# Patient Record
Sex: Male | Born: 1985 | Race: White | Hispanic: No | Marital: Single | Smoking: Current every day smoker
Health system: Southern US, Community
[De-identification: ages and names within clinical notes are randomized; demographics above are authoritative.]

## PROBLEM LIST (undated history)

## (undated) DIAGNOSIS — Z720 Tobacco use: Secondary | ICD-10-CM

## (undated) DIAGNOSIS — R569 Unspecified convulsions: Secondary | ICD-10-CM

## (undated) DIAGNOSIS — F1010 Alcohol abuse, uncomplicated: Secondary | ICD-10-CM

## (undated) DIAGNOSIS — Z8774 Personal history of (corrected) congenital malformations of heart and circulatory system: Secondary | ICD-10-CM

## (undated) HISTORY — PX: PATENT DUCTUS ARTERIOUS REPAIR: SHX269

---

## 2012-04-29 ENCOUNTER — Ambulatory Visit (INDEPENDENT_AMBULATORY_CARE_PROVIDER_SITE_OTHER): Payer: BC Managed Care – PPO | Admitting: Licensed Clinical Social Worker

## 2012-04-29 DIAGNOSIS — F4323 Adjustment disorder with mixed anxiety and depressed mood: Secondary | ICD-10-CM

## 2012-05-12 ENCOUNTER — Ambulatory Visit (INDEPENDENT_AMBULATORY_CARE_PROVIDER_SITE_OTHER): Payer: BC Managed Care – PPO | Admitting: Licensed Clinical Social Worker

## 2012-05-12 DIAGNOSIS — F4323 Adjustment disorder with mixed anxiety and depressed mood: Secondary | ICD-10-CM

## 2012-05-26 ENCOUNTER — Ambulatory Visit: Payer: BC Managed Care – PPO | Admitting: Licensed Clinical Social Worker

## 2012-06-09 ENCOUNTER — Ambulatory Visit (INDEPENDENT_AMBULATORY_CARE_PROVIDER_SITE_OTHER): Payer: BC Managed Care – PPO | Admitting: Licensed Clinical Social Worker

## 2012-06-09 DIAGNOSIS — F4323 Adjustment disorder with mixed anxiety and depressed mood: Secondary | ICD-10-CM

## 2012-06-23 ENCOUNTER — Ambulatory Visit (INDEPENDENT_AMBULATORY_CARE_PROVIDER_SITE_OTHER): Payer: BC Managed Care – PPO | Admitting: Licensed Clinical Social Worker

## 2012-06-23 DIAGNOSIS — F4323 Adjustment disorder with mixed anxiety and depressed mood: Secondary | ICD-10-CM

## 2012-08-24 ENCOUNTER — Ambulatory Visit (INDEPENDENT_AMBULATORY_CARE_PROVIDER_SITE_OTHER): Payer: BC Managed Care – PPO | Admitting: Licensed Clinical Social Worker

## 2012-08-24 DIAGNOSIS — F4323 Adjustment disorder with mixed anxiety and depressed mood: Secondary | ICD-10-CM

## 2012-08-31 ENCOUNTER — Ambulatory Visit (INDEPENDENT_AMBULATORY_CARE_PROVIDER_SITE_OTHER): Payer: BC Managed Care – PPO | Admitting: Licensed Clinical Social Worker

## 2012-08-31 DIAGNOSIS — F4323 Adjustment disorder with mixed anxiety and depressed mood: Secondary | ICD-10-CM

## 2012-09-07 ENCOUNTER — Ambulatory Visit (INDEPENDENT_AMBULATORY_CARE_PROVIDER_SITE_OTHER): Payer: BC Managed Care – PPO | Admitting: Licensed Clinical Social Worker

## 2012-09-07 DIAGNOSIS — F4323 Adjustment disorder with mixed anxiety and depressed mood: Secondary | ICD-10-CM

## 2014-07-08 ENCOUNTER — Emergency Department (HOSPITAL_COMMUNITY)
Admission: EM | Admit: 2014-07-08 | Discharge: 2014-07-09 | Disposition: A | Discharge status: Home / Self Care | Payer: BC Managed Care – PPO | Attending: Emergency Medicine | Admitting: Emergency Medicine

## 2014-07-08 ENCOUNTER — Encounter (HOSPITAL_COMMUNITY): Payer: Self-pay | Admitting: Emergency Medicine

## 2014-07-08 DIAGNOSIS — R1013 Epigastric pain: Secondary | ICD-10-CM | POA: Insufficient documentation

## 2014-07-08 DIAGNOSIS — F17200 Nicotine dependence, unspecified, uncomplicated: Secondary | ICD-10-CM | POA: Insufficient documentation

## 2014-07-08 DIAGNOSIS — R109 Unspecified abdominal pain: Secondary | ICD-10-CM | POA: Insufficient documentation

## 2014-07-08 DIAGNOSIS — R1011 Right upper quadrant pain: Secondary | ICD-10-CM | POA: Diagnosis not present

## 2014-07-08 NOTE — ED Notes (Signed)
Pt presents with intermittent RUQ pain x2 weeks, pain becoming persist x3 days. Pt denies nausea, vomiting, SOB, fever, chills, or chest pain.

## 2014-07-09 ENCOUNTER — Emergency Department (HOSPITAL_COMMUNITY): Payer: BC Managed Care – PPO

## 2014-07-09 LAB — CBC WITH DIFFERENTIAL/PLATELET
Basophils Absolute: 0 10*3/uL (ref 0.0–0.1)
Basophils Relative: 1 % (ref 0–1)
EOS ABS: 0.1 10*3/uL (ref 0.0–0.7)
EOS PCT: 2 % (ref 0–5)
HEMATOCRIT: 43.2 % (ref 39.0–52.0)
HEMOGLOBIN: 15.7 g/dL (ref 13.0–17.0)
LYMPHS ABS: 2.3 10*3/uL (ref 0.7–4.0)
Lymphocytes Relative: 42 % (ref 12–46)
MCH: 34.4 pg — AB (ref 26.0–34.0)
MCHC: 36.3 g/dL — ABNORMAL HIGH (ref 30.0–36.0)
MCV: 94.7 fL (ref 78.0–100.0)
MONO ABS: 0.5 10*3/uL (ref 0.1–1.0)
Monocytes Relative: 9 % (ref 3–12)
Neutro Abs: 2.5 10*3/uL (ref 1.7–7.7)
Neutrophils Relative %: 46 % (ref 43–77)
Platelets: 142 10*3/uL — ABNORMAL LOW (ref 150–400)
RBC: 4.56 MIL/uL (ref 4.22–5.81)
RDW: 12.1 % (ref 11.5–15.5)
WBC: 5.4 10*3/uL (ref 4.0–10.5)

## 2014-07-09 LAB — COMPREHENSIVE METABOLIC PANEL
ALK PHOS: 63 U/L (ref 39–117)
ALT: 62 U/L — ABNORMAL HIGH (ref 0–53)
AST: 67 U/L — ABNORMAL HIGH (ref 0–37)
Albumin: 4.2 g/dL (ref 3.5–5.2)
Anion gap: 16 — ABNORMAL HIGH (ref 5–15)
BUN: 9 mg/dL (ref 6–23)
CALCIUM: 9.3 mg/dL (ref 8.4–10.5)
CO2: 23 mEq/L (ref 19–32)
Chloride: 102 mEq/L (ref 96–112)
Creatinine, Ser: 0.84 mg/dL (ref 0.50–1.35)
GFR calc Af Amer: >90 mL/min (ref >90)
GFR calc non Af Amer: >90 mL/min (ref >90)
GLUCOSE: 85 mg/dL (ref 70–99)
POTASSIUM: 4 meq/L (ref 3.7–5.3)
Sodium: 141 mEq/L (ref 137–147)
Total Bilirubin: 0.3 mg/dL (ref 0.3–1.2)
Total Protein: 8.2 g/dL (ref 6.0–8.3)

## 2014-07-09 LAB — LIPASE, BLOOD: Lipase: 34 U/L (ref 11–59)

## 2014-07-09 MED ORDER — IOHEXOL 300 MG/ML  SOLN
100.0000 mL | Freq: Once | INTRAMUSCULAR | Status: AC | PRN
  Administered 2014-07-09: 100 mL via INTRAVENOUS

## 2014-07-09 MED ORDER — ONDANSETRON 8 MG PO TBDP: 8.0000 mg | ORAL_TABLET | Freq: Three times a day (TID) | ORAL | Status: DC | PRN

## 2014-07-09 MED ORDER — SODIUM CHLORIDE 0.9 % IV BOLUS (SEPSIS)
1000.0000 mL | Freq: Once | INTRAVENOUS | Status: AC
  Administered 2014-07-09: 1000 mL via INTRAVENOUS

## 2014-07-09 MED ORDER — OMEPRAZOLE 20 MG PO CPDR: 20.0000 mg | DELAYED_RELEASE_CAPSULE | Freq: Two times a day (BID) | ORAL | Status: DC

## 2014-07-09 MED ORDER — MORPHINE SULFATE 4 MG/ML IJ SOLN
6.0000 mg | Freq: Once | INTRAMUSCULAR | Status: AC
  Administered 2014-07-09: 6 mg via INTRAVENOUS
  Filled 2014-07-09: qty 2

## 2014-07-09 MED ORDER — IOHEXOL 300 MG/ML  SOLN
25.0000 mL | Freq: Once | INTRAMUSCULAR | Status: AC | PRN
  Administered 2014-07-09: 25 mL via ORAL

## 2014-07-09 NOTE — ED Provider Notes (Signed)
CSN: [id number]     Arrival date & time 07/08/14  2122 History   First MD Initiated Contact with Patient 07/08/14 2345     Chief Complaint  Patient presents with  . Abdominal Pain      HPI Patient presents to the emergency Department intermittent right upper quadrant pain over the past 2 weeks is now developing persistent upper abdominal discomfort and pain.  No nausea or vomiting.  Denies diarrhea.  Denies fevers or chills.  He does report significant alcohol use.  No prior history of pancreatitis.  Denies back pain or flank pain.  No chest pain or shortness of breath.  No fevers or chills   History reviewed. No pertinent past medical history. Past Surgical History  Procedure Laterality Date  . Patent ductus arterious repair      28 years old   History reviewed. No pertinent family history. History  Substance Use Topics  . Smoking status: Current Every Day Smoker  . Smokeless tobacco: Not on file  . Alcohol Use: 3.3 oz/week    3 Drinks containing 0.5 oz of alcohol, 3 Cans of beer per week    Review of Systems  All other systems reviewed and are negative.     Allergies  Review of patient's allergies indicates no known allergies.  Home Medications   Prior to Admission medications   Medication Sig Start Date End Date Taking? Authorizing Provider  omeprazole (PRILOSEC) 20 MG capsule Take 1 capsule (20 mg total) by mouth 2 (two) times daily before a meal. 07/09/14   Lyanne Co, MD  ondansetron (ZOFRAN ODT) 8 MG disintegrating tablet Take 1 tablet (8 mg total) by mouth every 8 (eight) hours as needed for nausea or vomiting. 07/09/14   Lyanne Co, MD   BP 147/80  Pulse 81  Temp(Src) 98.7 F (37.1 C) (Oral)  Resp 19  Wt 230 lb (104.327 kg)  SpO2 97% Physical Exam  Nursing note and vitals reviewed. Constitutional: He is oriented to person, place, and time. He appears well-developed and well-nourished.  HENT:  Head: Normocephalic and atraumatic.  Eyes: EOM are  normal.  Neck: Normal range of motion.  Cardiovascular: Normal rate, regular rhythm, normal heart sounds and intact distal pulses.   Pulmonary/Chest: Effort normal and breath sounds normal. No respiratory distress.  Abdominal: Soft. He exhibits no distension.  Mild epigastric tenderness without guarding or rebound. Mild RUQ abdominal tenderness  Musculoskeletal: Normal range of motion.  Neurological: He is alert and oriented to person, place, and time.  Skin: Skin is warm and dry.  Psychiatric: He has a normal mood and affect. Judgment normal.    ED Course  Procedures (including critical care time) Labs Review Labs Reviewed  CBC WITH DIFFERENTIAL - Abnormal; Notable for the following:    MCH 34.4 (*)    MCHC 36.3 (*)    Platelets 142 (*)    All other components within normal limits  COMPREHENSIVE METABOLIC PANEL - Abnormal; Notable for the following:    AST 67 (*)    ALT 62 (*)    Anion gap 16 (*)    All other components within normal limits  LIPASE, BLOOD    Imaging Review Ct Abdomen Pelvis W Contrast  07/09/2014   CLINICAL DATA:  Intermittent right upper quadrant pain x2 weeks  EXAM: CT ABDOMEN AND PELVIS WITH CONTRAST  TECHNIQUE: Multidetector CT imaging of the abdomen and pelvis was performed using the standard protocol following bolus administration of intravenous contrast.  CONTRAST:  OMNIPAQUE IOHEXOL 300 MG/ML  SOLN  COMPARISON:  None.  FINDINGS: Lower chest:  Lung bases are clear.  Hepatobiliary: Liver is within normal limits.  Gallbladder is underdistended. No intrahepatic or extrahepatic ductal dilatation.  Spleen: Within normal limits.  Pancreas: Within normal limits.  Stomach/Bowel: Stomach is unremarkable.  No evidence of bowel obstruction.  Normal appendix.  Adrenals/urinary tract: Adrenal glands are unremarkable.  Kidneys within normal limits.  No hydronephrosis.  Bladder is within normal limits.  Vascular/Lymphatic: No evidence of abdominal aortic aneurysm.  No  suspicious abdominopelvic lymphadenopathy.  Reproductive: Prostate is unremarkable.  Musculoskeletal: Visualized osseous structures are within normal limits.  Other: No abdominopelvic ascites.  IMPRESSION: No evidence of bowel obstruction.  Normal appendix.  No CT findings to account for the patient's intermittent right upper quadrant abdominal pain.   Electronically Signed   By: Charline Bills M.D.   On: 07/09/2014 01:30   US Abdomen Limited  07/09/2014   CLINICAL DATA:  RUQ abdominal pain  EXAM: US ABDOMEN LIMITED - RIGHT UPPER QUADRANT  COMPARISON:  Prior CT from earlier the same day.  FINDINGS: Gallbladder:  No gallstones or wall thickening visualized. No sonographic Murphy sign noted.  Common bile duct:  Diameter: 3.6 mm  Liver:  No focal lesion identified. Within normal limits in parenchymal echogenicity.  IMPRESSION: Normal right upper quadrant ultrasound.   Electronically Signed   By: Rise Mu M.D.   On: 07/09/2014 03:36  I personally reviewed the imaging tests through PACS system I reviewed available ER/hospitalization records through the EMR    EKG Interpretation None      MDM   Final diagnoses:  Abdominal pain, unspecified abdominal location    Overall the patient is feeling better.  CT scan demonstrates no significant pathology but the gallbladder was somewhat contracted on imaging studies.  Focused right upper quadrant ultrasound was performed which demonstrates no evidence of cholelithiasis.  Patient will be placed on twice a day PPI and asked to followup with GI.    Lyanne Co, MD 07/09/14 [phone]

## 2014-07-09 NOTE — ED Notes (Signed)
Pain almost gone pt sleeping

## 2014-07-09 NOTE — ED Notes (Signed)
To us

## 2014-07-09 NOTE — ED Notes (Signed)
ptfeelikng better

## 2014-07-09 NOTE — ED Notes (Signed)
To c-t pt only took 4 mg of ms he did not like the way it made him feel

## 2018-08-12 DIAGNOSIS — R569 Unspecified convulsions: Secondary | ICD-10-CM

## 2018-08-12 HISTORY — DX: Unspecified convulsions: R56.9

## 2018-08-23 ENCOUNTER — Encounter: Payer: Self-pay | Admitting: Podiatry

## 2018-08-26 ENCOUNTER — Ambulatory Visit: Payer: Self-pay | Admitting: Podiatry

## 2018-08-26 ENCOUNTER — Encounter

## 2018-08-30 ENCOUNTER — Ambulatory Visit (INDEPENDENT_AMBULATORY_CARE_PROVIDER_SITE_OTHER): Payer: Self-pay | Admitting: Podiatry

## 2018-08-30 ENCOUNTER — Other Ambulatory Visit: Payer: Self-pay | Admitting: Podiatry

## 2018-08-30 ENCOUNTER — Ambulatory Visit (INDEPENDENT_AMBULATORY_CARE_PROVIDER_SITE_OTHER): Payer: Self-pay

## 2018-08-30 DIAGNOSIS — G629 Polyneuropathy, unspecified: Secondary | ICD-10-CM

## 2018-08-30 DIAGNOSIS — M778 Other enthesopathies, not elsewhere classified: Secondary | ICD-10-CM

## 2018-08-30 DIAGNOSIS — M779 Enthesopathy, unspecified: Secondary | ICD-10-CM

## 2018-08-30 DIAGNOSIS — R200 Anesthesia of skin: Secondary | ICD-10-CM

## 2018-08-30 NOTE — Progress Notes (Signed)
This encounter was created in error - please disregard.

## 2018-08-30 NOTE — Patient Instructions (Signed)
I will put in a referral for you to see a neurologist. If you do not hear from anyone in the next week, please let me know  If was nice to meet you today. If you have any questions or any further concerns, please feel fee to give me a call. You can call our office at [phone] or please feel fee to send me a message through MyChart.

## 2018-09-01 ENCOUNTER — Telehealth: Payer: Self-pay | Admitting: *Deleted

## 2018-09-01 DIAGNOSIS — G629 Polyneuropathy, unspecified: Secondary | ICD-10-CM

## 2018-09-01 DIAGNOSIS — R200 Anesthesia of skin: Secondary | ICD-10-CM

## 2018-09-01 NOTE — Telephone Encounter (Signed)
Faxed referral, clinicals and demographics to [facility]. 

## 2018-09-01 NOTE — Progress Notes (Signed)
Subjective:   Patient ID: Darren Diaz, male   DOB: 32 y.o.   MRN: [id number]   HPI 32 year old male presents the office today for concerns of numbness to both of his feet and ankles.  He states that he had a little bit of numbness and all of a sudden about a month ago he woke up and he had numbness to both feet into the ankles.  He states that symmetrical on both sides.  He denies any recent injury or trauma.  Denies any back, hip, knee pain.  He states that he feels unbalanced at times because the numbness she has had no falls or weakness that he can recall.  He denies any burning pain.  The pain does not wake him up at night.  Is no other concerns.  He does smoke about a third to half a pack per day for the last 8 years.  He drinks alcohol about 2 beers and 2 shots a day for the last 14 years.  Denies any history of diabetes.  Review of Systems  All other systems reviewed and are negative.   No past medical history on file.  Past Surgical History:  Procedure Laterality Date  . PATENT DUCTUS ARTERIOUS REPAIR     32 years old     Current Outpatient Medications:  .  omeprazole (PRILOSEC) 20 MG capsule, Take 1 capsule (20 mg total) by mouth 2 (two) times daily before a meal. (Patient not taking: Reported on 08/30/2018), Disp: 60 capsule, Rfl: 0 .  ondansetron (ZOFRAN ODT) 8 MG disintegrating tablet, Take 1 tablet (8 mg total) by mouth every 8 (eight) hours as needed for nausea or vomiting. (Patient not taking: Reported on 08/30/2018), Disp: 10 tablet, Rfl: 0  No Known Allergies      Objective:  Physical Exam  General: AAO x3, NAD  Dermatological: Skin is warm, dry and supple bilateral. Nails x 10 are well manicured; remaining integument appears unremarkable at this time. There are no open sores, no preulcerative lesions, no rash or signs of infection present.  Vascular: Dorsalis Pedis artery and Posterior Tibial artery pedal pulses are 2/4 bilateral with immedate capillary fill time.  There is no pain with calf compression, swelling, warmth, erythema.    Neruologic: Sensation decreased with Dorann Ou monofilament bilaterally.  Mild decrease in vibratory sensation of the right side more than the left.  Negative Tinel sign bilaterally.  Musculoskeletal: No gross boney pedal deformities bilateral. No pain, crepitus, or limitation noted with foot and ankle range of motion bilateral. Muscular strength 5/5 in all groups tested bilateral.  Gait: Unassisted, Nonantalgic.     Assessment:   Neuropathy bilaterally    Plan:  -Treatment options discussed including all alternatives, risks, and complications -Etiology of symptoms were discussed -X-rays were obtained and reviewed with the patient.  No bony lesions and there is no significant bone spur formation.  No evidence of acute fracture. -Due to his numbness of his feet given his age I do want him to be evaluated by neurology will put a referral in for him.  I do think he has neuropathy and I think this is more due to alcohol use.  We discussed this today.    Vivi Barrack DPM

## 2018-09-01 NOTE — Telephone Encounter (Signed)
-----   Message from Vivi Barrack, DPM sent at 09/01/2018  8:05 AM EST ----- Can you please put in for a neurology consult? Thanks.

## 2018-09-02 ENCOUNTER — Encounter: Payer: Self-pay | Admitting: Neurology

## 2018-09-08 ENCOUNTER — Inpatient Hospital Stay (HOSPITAL_COMMUNITY): Payer: Self-pay

## 2018-09-08 ENCOUNTER — Other Ambulatory Visit: Payer: Self-pay

## 2018-09-08 ENCOUNTER — Encounter (HOSPITAL_COMMUNITY): Payer: Self-pay | Admitting: Emergency Medicine

## 2018-09-08 ENCOUNTER — Inpatient Hospital Stay (HOSPITAL_COMMUNITY)
Admission: EM | Admit: 2018-09-08 | Discharge: 2018-09-21 | DRG: 082 | Disposition: A | Discharge status: Home / Self Care | Payer: Self-pay | Attending: Family Medicine | Admitting: Family Medicine

## 2018-09-08 ENCOUNTER — Emergency Department (HOSPITAL_COMMUNITY): Payer: Self-pay

## 2018-09-08 DIAGNOSIS — R569 Unspecified convulsions: Secondary | ICD-10-CM

## 2018-09-08 DIAGNOSIS — G908 Other disorders of autonomic nervous system: Secondary | ICD-10-CM | POA: Diagnosis present

## 2018-09-08 DIAGNOSIS — D62 Acute posthemorrhagic anemia: Secondary | ICD-10-CM

## 2018-09-08 DIAGNOSIS — R2681 Unsteadiness on feet: Secondary | ICD-10-CM | POA: Diagnosis present

## 2018-09-08 DIAGNOSIS — F10231 Alcohol dependence with withdrawal delirium: Secondary | ICD-10-CM | POA: Diagnosis present

## 2018-09-08 DIAGNOSIS — W1830XA Fall on same level, unspecified, initial encounter: Secondary | ICD-10-CM | POA: Diagnosis present

## 2018-09-08 DIAGNOSIS — S069XAA Unspecified intracranial injury with loss of consciousness status unknown, initial encounter: Secondary | ICD-10-CM

## 2018-09-08 DIAGNOSIS — F329 Major depressive disorder, single episode, unspecified: Secondary | ICD-10-CM | POA: Diagnosis present

## 2018-09-08 DIAGNOSIS — J9601 Acute respiratory failure with hypoxia: Secondary | ICD-10-CM

## 2018-09-08 DIAGNOSIS — G621 Alcoholic polyneuropathy: Secondary | ICD-10-CM | POA: Diagnosis present

## 2018-09-08 DIAGNOSIS — I951 Orthostatic hypotension: Secondary | ICD-10-CM | POA: Diagnosis present

## 2018-09-08 DIAGNOSIS — S06369A Traumatic hemorrhage of cerebrum, unspecified, with loss of consciousness of unspecified duration, initial encounter: Secondary | ICD-10-CM

## 2018-09-08 DIAGNOSIS — D6959 Other secondary thrombocytopenia: Secondary | ICD-10-CM | POA: Diagnosis present

## 2018-09-08 DIAGNOSIS — F17200 Nicotine dependence, unspecified, uncomplicated: Secondary | ICD-10-CM | POA: Diagnosis present

## 2018-09-08 DIAGNOSIS — R471 Dysarthria and anarthria: Secondary | ICD-10-CM | POA: Diagnosis present

## 2018-09-08 DIAGNOSIS — S069X9A Unspecified intracranial injury with loss of consciousness of unspecified duration, initial encounter: Secondary | ICD-10-CM

## 2018-09-08 DIAGNOSIS — G40401 Other generalized epilepsy and epileptic syndromes, not intractable, with status epilepticus: Secondary | ICD-10-CM | POA: Diagnosis present

## 2018-09-08 DIAGNOSIS — E861 Hypovolemia: Secondary | ICD-10-CM | POA: Diagnosis present

## 2018-09-08 DIAGNOSIS — Y906 Blood alcohol level of 120-199 mg/100 ml: Secondary | ICD-10-CM | POA: Diagnosis present

## 2018-09-08 DIAGNOSIS — E876 Hypokalemia: Secondary | ICD-10-CM | POA: Diagnosis present

## 2018-09-08 DIAGNOSIS — R9431 Abnormal electrocardiogram [ECG] [EKG]: Secondary | ICD-10-CM | POA: Diagnosis present

## 2018-09-08 DIAGNOSIS — Z79899 Other long term (current) drug therapy: Secondary | ICD-10-CM

## 2018-09-08 DIAGNOSIS — Z720 Tobacco use: Secondary | ICD-10-CM

## 2018-09-08 DIAGNOSIS — F1010 Alcohol abuse, uncomplicated: Secondary | ICD-10-CM

## 2018-09-08 DIAGNOSIS — F418 Other specified anxiety disorders: Secondary | ICD-10-CM | POA: Diagnosis present

## 2018-09-08 DIAGNOSIS — G40901 Epilepsy, unspecified, not intractable, with status epilepticus: Secondary | ICD-10-CM

## 2018-09-08 DIAGNOSIS — R509 Fever, unspecified: Secondary | ICD-10-CM

## 2018-09-08 DIAGNOSIS — D539 Nutritional anemia, unspecified: Secondary | ICD-10-CM | POA: Diagnosis present

## 2018-09-08 DIAGNOSIS — D696 Thrombocytopenia, unspecified: Secondary | ICD-10-CM | POA: Insufficient documentation

## 2018-09-08 DIAGNOSIS — S066X9A Traumatic subarachnoid hemorrhage with loss of consciousness of unspecified duration, initial encounter: Principal | ICD-10-CM | POA: Diagnosis present

## 2018-09-08 DIAGNOSIS — F1020 Alcohol dependence, uncomplicated: Secondary | ICD-10-CM

## 2018-09-08 DIAGNOSIS — J9600 Acute respiratory failure, unspecified whether with hypoxia or hypercapnia: Secondary | ICD-10-CM | POA: Insufficient documentation

## 2018-09-08 DIAGNOSIS — Z4659 Encounter for fitting and adjustment of other gastrointestinal appliance and device: Secondary | ICD-10-CM

## 2018-09-08 DIAGNOSIS — G609 Hereditary and idiopathic neuropathy, unspecified: Secondary | ICD-10-CM

## 2018-09-08 HISTORY — DX: Unspecified convulsions: R56.9

## 2018-09-08 LAB — CBC WITH DIFFERENTIAL/PLATELET
ABS IMMATURE GRANULOCYTES: 0.03 10*3/uL (ref 0.00–0.07)
Basophils Absolute: 0.1 10*3/uL (ref 0.0–0.1)
Basophils Relative: 1 %
EOS PCT: 0 %
Eosinophils Absolute: 0 10*3/uL (ref 0.0–0.5)
HCT: 40.8 % (ref 39.0–52.0)
HEMOGLOBIN: 14.1 g/dL (ref 13.0–17.0)
Immature Granulocytes: 1 %
LYMPHS ABS: 0.5 10*3/uL — AB (ref 0.7–4.0)
LYMPHS PCT: 10 %
MCH: 34.7 pg — ABNORMAL HIGH (ref 26.0–34.0)
MCHC: 34.6 g/dL (ref 30.0–36.0)
MCV: 100.5 fL — AB (ref 80.0–100.0)
MONO ABS: 0.6 10*3/uL (ref 0.1–1.0)
Monocytes Relative: 11 %
NEUTROS ABS: 4.1 10*3/uL (ref 1.7–7.7)
Neutrophils Relative %: 77 %
PLATELETS: 61 10*3/uL — AB (ref 150–400)
RBC: 4.06 MIL/uL — AB (ref 4.22–5.81)
RDW: 13.6 % (ref 11.5–15.5)
WBC: 5.3 10*3/uL (ref 4.0–10.5)
nRBC: 0 % (ref 0.0–0.2)

## 2018-09-08 LAB — RAPID URINE DRUG SCREEN, HOSP PERFORMED
Amphetamines: NOT DETECTED
BARBITURATES: NOT DETECTED
Benzodiazepines: NOT DETECTED
Cocaine: NOT DETECTED
Opiates: NOT DETECTED
Tetrahydrocannabinol: NOT DETECTED

## 2018-09-08 LAB — GLUCOSE, CAPILLARY
Glucose-Capillary: 108 mg/dL — ABNORMAL HIGH (ref 70–99)
Glucose-Capillary: 66 mg/dL — ABNORMAL LOW (ref 70–99)
Glucose-Capillary: 112 mg/dL — ABNORMAL HIGH (ref 70–99)
Glucose-Capillary: 62 mg/dL — ABNORMAL LOW (ref 70–99)
Glucose-Capillary: 75 mg/dL (ref 70–99)

## 2018-09-08 LAB — COMPREHENSIVE METABOLIC PANEL
ALK PHOS: 66 U/L (ref 38–126)
ALT: 89 U/L — AB (ref 0–44)
AST: 198 U/L — ABNORMAL HIGH (ref 15–41)
Albumin: 4.1 g/dL (ref 3.5–5.0)
Anion gap: 20 — ABNORMAL HIGH (ref 5–15)
CALCIUM: 9.2 mg/dL (ref 8.9–10.3)
CHLORIDE: 101 mmol/L (ref 98–111)
CO2: 19 mmol/L — AB (ref 22–32)
Creatinine, Ser: 0.85 mg/dL (ref 0.61–1.24)
GFR calc Af Amer: >60 mL/min (ref >60)
GFR calc non Af Amer: >60 mL/min (ref >60)
Glucose, Bld: 147 mg/dL — ABNORMAL HIGH (ref 70–99)
Potassium: 3.3 mmol/L — ABNORMAL LOW (ref 3.5–5.1)
SODIUM: 140 mmol/L (ref 135–145)
Total Bilirubin: 1.2 mg/dL (ref 0.3–1.2)
Total Protein: 7.8 g/dL (ref 6.5–8.1)

## 2018-09-08 LAB — I-STAT ARTERIAL BLOOD GAS, ED
Acid-base deficit: 1 mmol/L (ref 0.0–2.0)
Bicarbonate: 24.6 mmol/L (ref 20.0–28.0)
O2 Saturation: 100 %
Patient temperature: 98.6
TCO2: 26 mmol/L (ref 22–32)
pCO2 arterial: 45.3 mmHg (ref 32.0–48.0)
pH, Arterial: 7.342 — ABNORMAL LOW (ref 7.350–7.450)
pO2, Arterial: 431 mmHg — ABNORMAL HIGH (ref 83.0–108.0)

## 2018-09-08 LAB — PHOSPHORUS: Phosphorus: 2.1 mg/dL — ABNORMAL LOW (ref 2.5–4.6)

## 2018-09-08 LAB — TRIGLYCERIDES: Triglycerides: 68 mg/dL (ref <150)

## 2018-09-08 LAB — MRSA PCR SCREENING: MRSA by PCR: NEGATIVE

## 2018-09-08 LAB — ETHANOL: Alcohol, Ethyl (B): 125 mg/dL — ABNORMAL HIGH (ref <10)

## 2018-09-08 LAB — MAGNESIUM: Magnesium: 1.3 mg/dL — ABNORMAL LOW (ref 1.7–2.4)

## 2018-09-08 LAB — CBG MONITORING, ED: GLUCOSE-CAPILLARY: 95 mg/dL (ref 70–99)

## 2018-09-08 LAB — TROPONIN I: Troponin I: <0.03 ng/mL (ref <0.03)

## 2018-09-08 MED ORDER — LORAZEPAM 2 MG/ML IJ SOLN: 0.0000 mg | Freq: Two times a day (BID) | INTRAMUSCULAR | Status: DC

## 2018-09-08 MED ORDER — LORAZEPAM 1 MG PO TABS
1.0000 mg | ORAL_TABLET | Freq: Three times a day (TID) | ORAL | Status: DC
  Administered 2018-09-08 (×2): 1 mg via ORAL
  Filled 2018-09-08 (×2): qty 1

## 2018-09-08 MED ORDER — FENTANYL CITRATE (PF) 100 MCG/2ML IJ SOLN
100.0000 ug | INTRAMUSCULAR | Status: DC | PRN
  Administered 2018-09-08 – 2018-09-09 (×4): 100 ug via INTRAVENOUS
  Filled 2018-09-08 (×5): qty 2

## 2018-09-08 MED ORDER — LEVETIRACETAM IN NACL 1000 MG/100ML IV SOLN
1000.0000 mg | Freq: Once | INTRAVENOUS | Status: AC
  Administered 2018-09-08: 1000 mg via INTRAVENOUS

## 2018-09-08 MED ORDER — ONDANSETRON HCL 4 MG/2ML IJ SOLN
INTRAMUSCULAR | Status: AC
  Administered 2018-09-08: 4 mg
  Filled 2018-09-08: qty 2

## 2018-09-08 MED ORDER — FOLIC ACID 1 MG PO TABS: 1.0000 mg | ORAL_TABLET | Freq: Every day | ORAL | Status: DC

## 2018-09-08 MED ORDER — LORAZEPAM 1 MG PO TABS: 1.0000 mg | ORAL_TABLET | Freq: Four times a day (QID) | ORAL | Status: DC | PRN

## 2018-09-08 MED ORDER — SODIUM CHLORIDE 0.9 % IV BOLUS
1000.0000 mL | Freq: Once | INTRAVENOUS | Status: AC
  Administered 2018-09-08: 1000 mL via INTRAVENOUS

## 2018-09-08 MED ORDER — LEVETIRACETAM IN NACL 500 MG/100ML IV SOLN
500.0000 mg | Freq: Two times a day (BID) | INTRAVENOUS | Status: DC
  Administered 2018-09-08 – 2018-09-14 (×13): 500 mg via INTRAVENOUS
  Filled 2018-09-08 (×15): qty 100

## 2018-09-08 MED ORDER — FENTANYL CITRATE (PF) 100 MCG/2ML IJ SOLN
100.0000 ug | INTRAMUSCULAR | Status: AC | PRN
  Administered 2018-09-08 (×3): 100 ug via INTRAVENOUS
  Filled 2018-09-08 (×3): qty 2

## 2018-09-08 MED ORDER — MAGNESIUM SULFATE 2 GM/50ML IV SOLN
2.0000 g | Freq: Once | INTRAVENOUS | Status: AC
  Administered 2018-09-08: 2 g via INTRAVENOUS
  Filled 2018-09-08: qty 50

## 2018-09-08 MED ORDER — POTASSIUM CHLORIDE 20 MEQ/15ML (10%) PO SOLN
40.0000 meq | Freq: Once | ORAL | Status: AC
  Administered 2018-09-08: 40 meq
  Filled 2018-09-08: qty 30

## 2018-09-08 MED ORDER — PROPOFOL 1000 MG/100ML IV EMUL
INTRAVENOUS | Status: AC
  Filled 2018-09-08: qty 100

## 2018-09-08 MED ORDER — CHLORHEXIDINE GLUCONATE 0.12% ORAL RINSE (MEDLINE KIT)
15.0000 mL | Freq: Two times a day (BID) | OROMUCOSAL | Status: DC
  Administered 2018-09-08 – 2018-09-09 (×3): 15 mL via OROMUCOSAL

## 2018-09-08 MED ORDER — ADULT MULTIVITAMIN W/MINERALS CH
1.0000 | ORAL_TABLET | Freq: Every day | ORAL | Status: DC
  Administered 2018-09-09: 1 via ORAL
  Filled 2018-09-08: qty 1

## 2018-09-08 MED ORDER — THIAMINE HCL 100 MG/ML IJ SOLN
100.0000 mg | Freq: Every day | INTRAMUSCULAR | Status: DC
  Administered 2018-09-08 – 2018-09-09 (×2): 100 mg via INTRAVENOUS
  Filled 2018-09-08 (×2): qty 2

## 2018-09-08 MED ORDER — DOCUSATE SODIUM 50 MG/5ML PO LIQD
100.0000 mg | Freq: Two times a day (BID) | ORAL | Status: DC | PRN
  Filled 2018-09-08: qty 10

## 2018-09-08 MED ORDER — ETOMIDATE 2 MG/ML IV SOLN
INTRAVENOUS | Status: AC | PRN
  Administered 2018-09-08: 20 mg via INTRAVENOUS

## 2018-09-08 MED ORDER — ROCURONIUM BROMIDE 50 MG/5ML IV SOLN
INTRAVENOUS | Status: AC | PRN
  Administered 2018-09-08: 100 mg via INTRAVENOUS

## 2018-09-08 MED ORDER — DEXMEDETOMIDINE HCL IN NACL 200 MCG/50ML IV SOLN: 0.0000 ug/kg/h | INTRAVENOUS | Status: DC

## 2018-09-08 MED ORDER — PROPOFOL 1000 MG/100ML IV EMUL
5.0000 ug/kg/min | INTRAVENOUS | Status: DC
  Administered 2018-09-08: 80 ug/kg/min via INTRAVENOUS
  Administered 2018-09-08: 62.724 ug/kg/min via INTRAVENOUS
  Administered 2018-09-08: 80 ug/kg/min via INTRAVENOUS
  Administered 2018-09-08: 40 ug/kg/min via INTRAVENOUS
  Administered 2018-09-08: 50 ug/kg/min via INTRAVENOUS
  Administered 2018-09-08 (×2): 80 ug/kg/min via INTRAVENOUS
  Administered 2018-09-08: 20 ug/kg/min via INTRAVENOUS
  Administered 2018-09-09: 35.842 ug/kg/min via INTRAVENOUS
  Administered 2018-09-09 (×2): 80 ug/kg/min via INTRAVENOUS
  Filled 2018-09-08 (×9): qty 100

## 2018-09-08 MED ORDER — LORAZEPAM 2 MG/ML IJ SOLN
INTRAMUSCULAR | Status: AC
  Administered 2018-09-08: 2 mg
  Filled 2018-09-08: qty 1

## 2018-09-08 MED ORDER — FOLIC ACID 1 MG PO TABS
1.0000 mg | ORAL_TABLET | Freq: Every day | ORAL | Status: DC
  Administered 2018-09-09: 1 mg via ORAL
  Filled 2018-09-08: qty 1

## 2018-09-08 MED ORDER — FAMOTIDINE 40 MG/5ML PO SUSR
20.0000 mg | Freq: Two times a day (BID) | ORAL | Status: DC
  Administered 2018-09-08: 20 mg
  Filled 2018-09-08 (×2): qty 2.5

## 2018-09-08 MED ORDER — LORAZEPAM 2 MG/ML IJ SOLN: 1.0000 mg | Freq: Four times a day (QID) | INTRAMUSCULAR | Status: DC | PRN

## 2018-09-08 MED ORDER — BISACODYL 10 MG RE SUPP: 10.0000 mg | Freq: Every day | RECTAL | Status: DC | PRN

## 2018-09-08 MED ORDER — MIDAZOLAM HCL 2 MG/2ML IJ SOLN
INTRAMUSCULAR | Status: AC
  Filled 2018-09-08: qty 6

## 2018-09-08 MED ORDER — PROPOFOL 1000 MG/100ML IV EMUL
0.0000 ug/kg/min | INTRAVENOUS | Status: DC
  Administered 2018-09-08: 10 ug/kg/min via INTRAVENOUS

## 2018-09-08 MED ORDER — MIDAZOLAM HCL 2 MG/2ML IJ SOLN
5.0000 mg | Freq: Once | INTRAMUSCULAR | Status: AC
  Administered 2018-09-08: 5 mg via INTRAVENOUS

## 2018-09-08 MED ORDER — MIDAZOLAM HCL 2 MG/2ML IJ SOLN
INTRAMUSCULAR | Status: AC
  Administered 2018-09-08: 2 mg
  Filled 2018-09-08: qty 2

## 2018-09-08 MED ORDER — DEXTROSE 50 % IV SOLN
INTRAVENOUS | Status: AC
  Administered 2018-09-08: 25 mL
  Filled 2018-09-08: qty 50

## 2018-09-08 MED ORDER — MIDAZOLAM HCL 2 MG/2ML IJ SOLN
2.0000 mg | INTRAMUSCULAR | Status: DC | PRN
  Administered 2018-09-08 (×7): 2 mg via INTRAVENOUS
  Filled 2018-09-08 (×6): qty 2

## 2018-09-08 MED ORDER — THIAMINE HCL 100 MG/ML IJ SOLN: 100.0000 mg | Freq: Every day | INTRAMUSCULAR | Status: DC

## 2018-09-08 MED ORDER — VITAMIN B-1 100 MG PO TABS: 100.0000 mg | ORAL_TABLET | Freq: Every day | ORAL | Status: DC

## 2018-09-08 MED ORDER — LORAZEPAM 2 MG/ML IJ SOLN
2.0000 mg | Freq: Once | INTRAMUSCULAR | Status: AC
  Administered 2018-09-08: 2 mg via INTRAVENOUS

## 2018-09-08 MED ORDER — LACTATED RINGERS IV SOLN
INTRAVENOUS | Status: DC
  Administered 2018-09-08 – 2018-09-10 (×4): via INTRAVENOUS

## 2018-09-08 MED ORDER — LORAZEPAM 2 MG/ML IJ SOLN
INTRAMUSCULAR | Status: AC
  Filled 2018-09-08: qty 3

## 2018-09-08 MED ORDER — LORAZEPAM 2 MG/ML IJ SOLN: 0.0000 mg | Freq: Four times a day (QID) | INTRAMUSCULAR | Status: DC

## 2018-09-08 MED ORDER — ORAL CARE MOUTH RINSE
15.0000 mL | OROMUCOSAL | Status: DC
  Administered 2018-09-08 – 2018-09-09 (×8): 15 mL via OROMUCOSAL

## 2018-09-08 MED ORDER — FAMOTIDINE IN NACL 20-0.9 MG/50ML-% IV SOLN: 20.0000 mg | Freq: Two times a day (BID) | INTRAVENOUS | Status: DC

## 2018-09-08 MED ORDER — DEXTROSE 50 % IV SOLN
25.0000 mL | Freq: Once | INTRAVENOUS | Status: AC
  Administered 2018-09-08: 25 mL via INTRAVENOUS
  Filled 2018-09-08: qty 50
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
## 2018-09-08 NOTE — ED Notes (Signed)
Patient transported to MRI 

## 2018-09-08 NOTE — Consult Note (Signed)
Neurology Consultation Reason for Consult: Seizure Referring Physician: Perry Mount  CC: Seizure  History is obtained from: Patient  HPI: Darren Diaz is a 32 y.o. male with a past medical history significant for alcohol abuse who presents with new onset seizures.   Over the past few weeks he has been complaining of numbness in his feet and is actually fallen a few times because of that.  His family reports that he fell while coming up the steps last evening.  They last seen well around 11 PM and then he came into the room around 1 or 1:30 AM.  Per the family's report, he was acting delirious at the time, saying strange things.  They went downstairs with him and then he had a witnessed sudden onset seizure, which sounds generalized in onset and fell and hit his head.  On arrival to the emergency department, CT was obtained which shows a small right temporal contusion with subarachnoid.  Though he has cut back some on his drinking, he he had been drinking last night and his EtOH level was 125.  He has also been complaining of difficulty with numbness in his feet, which is caused him to fall a couple of times over the past few weeks.  Apparently something similar has happened in the past though he had not mentioned it to the parents.  ROS: A 14 point ROS was performed and is negative except as noted in the HPI.    Past Medical History:  Diagnosis Date  . Seizures (HCC)      Family history: No history of seizures   Social History:  reports that he has been smoking. He has never used smokeless tobacco. He reports that he drinks about 6.0 standard drinks of alcohol per week. He reports that he does not use drugs.   Exam: Current vital signs: BP 118/90   Pulse 100   Resp 18   Ht 5\' 11"  (1.803 m)   Wt 93 kg   SpO2 100%   BMI 28.59 kg/m  Vital signs in last 24 hours: Pulse Rate:  [87-142] 100 (11/28 0800) Resp:  [13-18] 18 (11/28 0800) BP: (98-199)/(69-129) 118/90 (11/28  0800) SpO2:  [93 %-100 %] 100 % (11/28 0800) FiO2 (%):  [40 %-100 %] 40 % (11/28 1610) Weight:  [93 kg] 93 kg (11/28 0414)   Physical Exam  Constitutional: Appears well-developed and well-nourished.  Psych: Does not respond Eyes: No scleral injection HENT: ET tube in place Head: Normocephalic.  Cardiovascular: Normal rate and regular rhythm.  Respiratory: Ventilated GI: Soft.  No distension. There is no tenderness.  Skin: WDI  Neuro: Mental Status: Patient is obtunded, does not open eyes or follow commands. Cranial Nerves: II: Visual Fields are full. Pupils are equal, round, and reactive to light.   III,IV, VI: Eyes are slightly disconjugate, but  Doll's eye response is intact  V: Facial sensation is symmetric to temperature VII: Facial movement is symmetric.  VIII: hearing is intact to voice X: Uvula elevates symmetrically XI: Shoulder shrug is symmetric. XII: tongue is midline without atrophy or fasciculations.  Motor: He has spontaneous movements as well as withdrawal to noxious stimuli in all 4 extremities  sensory: He responds to noxious stimulation in all 4 extremities Deep Tendon Reflexes: 2+ and symmetric in the biceps and patellae and ankles Plantars: Toes are downgoing bilaterally.  Cerebellar: Does not perform   I have reviewed labs in epic and the results pertinent to this consultation are: Magnesium 1.3  I have reviewed the images obtained: CT head-small hemorrhagic contusion in the right temporal lobe  Impression: 32 year old male with new onset seizures.  Alcohol withdrawal seizures are  Possible, especially if he had just consumed alcohol to try and stave off withdrawal when he had a seizure.  With reported a fall earlier in the evening and strange behavior just prior to his fall, however, I do wonder if the initial fall could have caused the temporal hemorrhage which subsequently caused a seizure.  To some degree this is an academic question, but I  will continue Keppra for the time being.  The subacute worsening of his gait with numbness of his legs is also concerning.  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  I would be worried about something like MS with his history of a similar episode previously.  recommendations: 1) Keppra 500 mg twice daily 2) alcohol withdrawal protocol 3) EEG 4) MRI brain, cervical spine, thoracic spine 5) neurology will continue to follow   Ritta Slot, MD [facility] [phone]  If 7pm- 7am, please page neurology on call as listed in AMION.

## 2018-09-08 NOTE — Progress Notes (Signed)
EEG completed; results pending.    

## 2018-09-08 NOTE — ED Notes (Signed)
Report given accepting RN. This RN is still in MRI with patient, will transport to [facility] after MRI

## 2018-09-08 NOTE — ED Provider Notes (Signed)
[facility] EMERGENCY DEPARTMENT Provider Note   CSN: [id number] Arrival date & time: 09/08/18  0406     History   Chief Complaint Chief Complaint  Patient presents with  . Seizures    HPI Darren Diaz is a 32 y.o. male.  Patient without significant medical history presents after a witnessed, first-time seizure lasting approximately 4 minutes. Per EMS, he was at home with his parents when they had an argument, after which he walked away into the kitchen. His parents heard him fall and came in to witness generalized shaking. EMS reports post-ictal symptoms of decreased level of consciousness on their arrival. He reports he drinks alcohol daily, approximately 4 shots of liquor, and drank the same amount yesterday as per his usual. No recent illness, head injury. Nausea with vomiting started after arrival to ED.   The history is provided by the patient. No language interpreter was used.    Past Medical History:  Diagnosis Date  . Seizures (HCC)     There are no active problems to display for this patient.   Past Surgical History:  Procedure Laterality Date  . 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 Authorizing Provider  omeprazole (PRILOSEC) 20 MG capsule Take 1 capsule (20 mg total) by mouth 2 (two) times daily before a meal. Patient not taking: Reported on 08/30/2018 07/09/14   Azalia Bilis, MD  ondansetron (ZOFRAN ODT) 8 MG disintegrating tablet Take 1 tablet (8 mg total) by mouth every 8 (eight) hours as needed for nausea or vomiting. Patient not taking: Reported on 08/30/2018 07/09/14   Azalia Bilis, MD    Family History No family history on file.  Social History Social History   Tobacco Use  . Smoking status: Current Every Day Smoker  . Smokeless tobacco: Never Used  Substance Use Topics  . Alcohol use: Yes    Alcohol/week:  6.0 standard drinks    Types: 3 Cans of beer, 3 Standard drinks or equivalent per week  . Drug use: No     Allergies   Patient has no known allergies.   Review of Systems Review of Systems  Constitutional: Negative for chills and fever.  HENT: Negative.   Respiratory: Negative.   Cardiovascular: Negative.   Gastrointestinal: Positive for nausea and vomiting.  Musculoskeletal: Negative.   Skin: Negative.   Neurological: Positive for seizures.     Physical Exam Updated Vital Signs Wt 93 kg   SpO2 93%   Physical Exam  Constitutional: He is oriented to person, place, and time. He appears well-developed and well-nourished.  HENT:  Head: Normocephalic.  Right scalp hematoma without open wound.  Neck: Normal range of motion. Neck supple.  Cardiovascular: Normal rate and regular rhythm.  Pulmonary/Chest: Effort normal and breath sounds normal.  Abdominal: Soft. Bowel sounds are normal. There is no tenderness. There is no rebound and no guarding.  Musculoskeletal: Normal range of motion.  Abrasion to right great toe.  No bony deformity.  Neurological: He is alert and oriented to person, place, and time. He has normal strength. He exhibits normal muscle tone. Coordination normal. GCS eye subscore is 4. GCS verbal subscore is 5. GCS motor subscore is 6.  Gait not tested.   Skin: Skin is warm and dry. No rash noted.  Psychiatric: He has a normal mood and affect.  ED Treatments / Results  Labs (all labs ordered are listed, but only abnormal results are displayed) Labs Reviewed  CBC WITH DIFFERENTIAL/PLATELET  COMPREHENSIVE METABOLIC PANEL  RAPID URINE DRUG SCREEN, HOSP PERFORMED  ETHANOL   Results for orders placed or performed during the hospital encounter of 09/08/18  CBC with Differential  Result Value Ref Range   WBC 5.3 4.0 - 10.5 K/uL   RBC 4.06 (L) 4.22 - 5.81 MIL/uL   Hemoglobin 14.1 13.0 - 17.0 g/dL   HCT 16.1 09.6 - 04.5 %   MCV 100.5 (H) 80.0 - 100.0 fL     MCH 34.7 (H) 26.0 - 34.0 pg   MCHC 34.6 30.0 - 36.0 g/dL   RDW 40.9 81.1 - 91.4 %   Platelets 61 (L) 150 - 400 K/uL   nRBC 0.0 0.0 - 0.2 %   Neutrophils Relative % 77 %   Neutro Abs 4.1 1.7 - 7.7 K/uL   Lymphocytes Relative 10 %   Lymphs Abs 0.5 (L) 0.7 - 4.0 K/uL   Monocytes Relative 11 %   Monocytes Absolute 0.6 0.1 - 1.0 K/uL   Eosinophils Relative 0 %   Eosinophils Absolute 0.0 0.0 - 0.5 K/uL   Basophils Relative 1 %   Basophils Absolute 0.1 0.0 - 0.1 K/uL   Immature Granulocytes 1 %   Abs Immature Granulocytes 0.03 0.00 - 0.07 K/uL   Polychromasia PRESENT   Comprehensive metabolic panel  Result Value Ref Range   Sodium 140 135 - 145 mmol/L   Potassium 3.3 (L) 3.5 - 5.1 mmol/L   Chloride 101 98 - 111 mmol/L   CO2 19 (L) 22 - 32 mmol/L   Glucose, Bld 147 (H) 70 - 99 mg/dL   BUN <5 (L) 6 - 20 mg/dL   Creatinine, Ser 7.82 0.61 - 1.24 mg/dL   Calcium 9.2 8.9 - 95.6 mg/dL   Total Protein 7.8 6.5 - 8.1 g/dL   Albumin 4.1 3.5 - 5.0 g/dL   AST 213 (H) 15 - 41 U/L   ALT 89 (H) 0 - 44 U/L   Alkaline Phosphatase 66 38 - 126 U/L   Total Bilirubin 1.2 0.3 - 1.2 mg/dL   GFR calc non Af Amer >60 >60 mL/min   GFR calc Af Amer >60 >60 mL/min   Anion gap 20 (H) 5 - 15  Urine rapid drug screen (hosp performed)  Result Value Ref Range   Opiates NONE DETECTED NONE DETECTED   Cocaine NONE DETECTED NONE DETECTED   Benzodiazepines NONE DETECTED NONE DETECTED   Amphetamines NONE DETECTED NONE DETECTED   Tetrahydrocannabinol NONE DETECTED NONE DETECTED   Barbiturates NONE DETECTED NONE DETECTED  Ethanol  Result Value Ref Range   Alcohol, Ethyl (B) 125 (H) <10 mg/dL  Triglycerides  Result Value Ref Range   Triglycerides 68 <150 mg/dL  I-Stat arterial blood gas, ED  Result Value Ref Range   pH, Arterial 7.342 (L) 7.350 - 7.450   pCO2 arterial 45.3 32.0 - 48.0 mmHg   pO2, Arterial 431.0 (H) 83.0 - 108.0 mmHg   Bicarbonate 24.6 20.0 - 28.0 mmol/L   TCO2 26 22 - 32 mmol/L   O2  Saturation 100.0 %   Acid-base deficit 1.0 0.0 - 2.0 mmol/L   Patient temperature 98.6 F    Collection site RADIAL, ALLEN'S TEST ACCEPTABLE    Drawn by Operator    Sample type ARTERIAL     EKG None  Radiology No results found. Ct Head Wo Contrast  Result Date: 09/08/2018 CLINICAL DATA:  New onset seizure. EXAM: CT HEAD WITHOUT CONTRAST TECHNIQUE: Contiguous axial images were obtained from the base of the skull through the vertex without intravenous contrast. COMPARISON:  None. FINDINGS: Brain: Small volume intraparenchymal hemorrhage in the right temporal lobe. Possible small adjacent subarachnoid hemorrhage versus motion. No midline shift or mass effect. No hydrocephalus, the basilar cisterns are patent. Vascular: No hyperdense vessel. Skull: No fracture or focal lesion. Sinuses/Orbits: Mucosal thickening of both maxillary sinuses. Mastoid air cells are clear. Other: Right parietal-occipital scalp hematoma. IMPRESSION: 1. Small volume intraparenchymal hemorrhage in the right temporal lobe. Possible small adjacent subarachnoid hemorrhage versus motion artifact. 2. Right parietal-occipital scalp hematoma without skull fracture. Critical Value/emergent results were called by telephone at the time of interpretation on 09/08/2018 at 5:16 am to PA [facility] Davit Vassar , who verbally acknowledged these results. Electronically Signed   By: Narda Rutherford M.D.   On: 09/08/2018 05:16   Dg Chest Portable 1 View  Result Date: 09/08/2018 CLINICAL DATA:  Initial evaluation for endotracheal tube placement. EXAM: PORTABLE CHEST 1 VIEW COMPARISON:  None available. FINDINGS: Endotracheal tube in place with tip position approximately 4.6 cm above the carina. Enteric tube courses into the abdomen. Cardiac and mediastinal silhouettes within normal limits. Lungs hypoinflated. No focal infiltrates. No edema or effusion. No pneumothorax. Osseous structures within normal limits. IMPRESSION: 1. Endotracheal tube in place  with tip position 4.6 cm above the carina. 2. Low lung volumes.  No other active cardiopulmonary disease. Electronically Signed   By: Rise Mu M.D.   On: 09/08/2018 05:41   Dg Foot Complete Left  Result Date: 08/30/2018 Please see detailed radiograph report in office note.  Dg Foot Complete Right  Result Date: 08/30/2018 Please see detailed radiograph report in office note.   Procedures Procedures (including critical care time) CRITICAL CARE Performed by: Arnoldo Hooker   Total critical care time: 45 minutes  Critical care time was exclusive of separately billable procedures and treating other patients.  Critical care was necessary to treat or prevent imminent or life-threatening deterioration.  Critical care was time spent personally by me on the following activities: development of treatment plan with patient and/or surrogate as well as nursing, discussions with consultants, evaluation of patient's response to treatment, examination of patient, obtaining history from patient or surrogate, ordering and performing treatments and interventions, ordering and review of laboratory studies, ordering and review of radiographic studies, pulse oximetry and re-evaluation of patient's condition.  Medications Ordered in ED Medications  ondansetron (ZOFRAN) 4 MG/2ML injection (has no administration in time range)     Initial Impression / Assessment and Plan / ED Course  I have reviewed the triage vital signs and the nursing notes.  Pertinent labs & imaging results that were available during my care of the patient were reviewed by me and considered in my medical decision making (see chart for details).     Patient to ED after first-time seizure, witnessed by parents. Post-ictal symptoms per EMS. History of daily alcohol use but reports unchanged consumption in the last 2-3 days.   He is awake and alert on arrival. Onset nausea with vomiting. Labs started. Patient stable.    4:45 - patient returned from CT. Seizure activity begun shortly after, lasting about 2 minutes. Dr. Manus Gunning at bedside. 2 mg Ativan and 1 gm Keppra given.   Patient subsequently with significant combativeness, tachycardic to 150's, hypertensive. RSI performed by Dr. Manus Gunning and PA Dahlia Client.   Parents at bedside  who state that they have just become aware of his level of alcohol use. Consider the likelihood that the patient is not able to drink per his usual with parents visiting that were unaware.   CT Head shows a small intraparenchymal right temporal bleed, ?small SAH associated with visualized scalp hematoma. Neurosurgery [facility]) consulted.   Discussed patient condition and care with PCCM who will be in to evaluate the patient for admission. Appreciate their help with his care.   Neurology also consulted regarding new-onset multi-seizures  Parents updated on tests, condition and plan of care. All questions answered.    Final Clinical Impressions(s) / ED Diagnoses   Final diagnoses:  None   1. New onset seizure 2. Alcohol dependence 3. DT's  ED Discharge Orders    None       Elpidio Anis, PA-C 09/08/18 1610    Glynn Octave, MD 09/08/18 [phone]

## 2018-09-08 NOTE — Procedures (Signed)
History: 32 year old male with a history of intracerebral hemorrhage and new onset seizures  Sedation: Propofol, Versed  Technique: This is a 21 channel routine scalp EEG performed at the bedside with bipolar and monopolar montages arranged in accordance to the international 10/20 system of electrode placement. One channel was dedicated to EKG recording.    Background: The background consists primarily of low voltage bifrontally predominant generalized beta activity.  There are occasional runs of activity resembling sleep spindles.  No posterior dominant rhythm was seen.  Photic stimulation: Physiologic driving is not performed  EEG Abnormalities: Sedated EEG  Clinical Interpretation: This EEG is consistent with the patient's known sedated state.  There was no seizure or seizure predisposition recorded on this study. Please note that a normal EEG does not preclude the possibility of epilepsy.   Ritta Slot, MD [facility] [phone]  If 7pm- 7am, please page neurology on call as listed in AMION.

## 2018-09-08 NOTE — Consult Note (Signed)
Neurosurgery Consultation  Reason for Consult: Cerebral contusion, traumatic brain injury Referring Physician: Minette Headland  CC: Seizures  HPI: This is a 32 y.o. man that presents with suspected EtOH withdrawal seizures. He reportedly fell and struck his head then had tonic-clonic activity. He had another seizure in the ED and was intubated, loaded w/ AEDs.. No known recent use of anti-platelet or anti-coagulant medications.   ROS: A 14 point ROS was performed and is negative except as noted in the HPI.   PMHx:  Past Medical History:  Diagnosis Date  . Seizures (HCC)    FamHx: No family history on file. SocHx:  reports that he has been smoking. He has never used smokeless tobacco. He reports that he drinks about 6.0 standard drinks of alcohol per week. He reports that he does not use drugs.  Exam: Vital signs in last 24 hours: Pulse Rate:  [90-142] 99 (11/28 0725) Resp:  [13-18] 18 (11/28 0725) BP: (98-199)/(69-129) 111/81 (11/28 0725) SpO2:  [93 %-100 %] 100 % (11/28 0725) FiO2 (%):  [40 %-100 %] 40 % (11/28 7062) Weight:  [93 kg] 93 kg (11/28 0414) General: Lying in hospital bed, intubated, appears acutely ill Head: normocephalic, +cephalohematoma present HEENT: neck supple Pulmonary: ETT in place, on ventilator, good chest rise bilaterally Cardiac: RRR Abdomen: obese S NT ND Extremities: warm and well perfused x4, +lac on R great toe Neuro: Intubated on propofol gtt, gtt not held given seizure activity, PERRL, gaze neutral, +c/c/g, no response to painful stimulus x4  Assessment and Plan: 32 y.o. man w/ EtOH w/d seizures. CTH obtained which reportedly shows a small temporal contusion. PACS unfortunately is having technical issues and the films are not available fore review, but radiology read the CT has showing a small temporal contusion.  -no acute neurosurgical intervention indicated at this time -seizure management per primary team -please call with any concerns or  questions  Jadene Pierini, MD 09/08/18 7:44 AM [facility]

## 2018-09-08 NOTE — ED Notes (Signed)
anoether bottle of propofol intiatesd top replace empty (prevous vial)

## 2018-09-08 NOTE — ED Provider Notes (Signed)
I was asked to assist in patient's care.  Intubation performed under direct supervision of Dr. Manus Gunning.  ED Course/Procedures     Procedure Name: Intubation Date/Time: 09/08/2018 5:16 AM Performed by: Roxy Horseman, PA-C Pre-anesthesia Checklist: Patient identified, Emergency Drugs available, Suction available, Patient being monitored and Timeout performed Oxygen Delivery Method: Ambu bag Preoxygenation: Pre-oxygenation with 100% oxygen Induction Type: IV induction Ventilation: Mask ventilation without difficulty Laryngoscope Size: Glidescope and 3 Grade View: Grade I Tube type: Subglottic suction tube Tube size: 7.5 mm Number of attempts: 1 Airway Equipment and Method: Video-laryngoscopy Placement Confirmation: ETT inserted through vocal cords under direct vision,  Positive ETCO2,  CO2 detector and Breath sounds checked- equal and bilateral Secured at: 23 cm Tube secured with: ETT holder Dental Injury: Teeth and Oropharynx as per pre-operative assessment            Roxy Horseman, PA-C 09/08/18 0518    Glynn Octave, MD 09/08/18 [phone]

## 2018-09-08 NOTE — ED Notes (Signed)
Patient transported to CT 

## 2018-09-08 NOTE — H&P (Signed)
NAME:  Darren Diaz, MRN:  [id number], DOB:  [birth year], LOS: 0 ADMISSION DATE:  09/08/2018, CONSULTATION DATE:  09/08/2018 REFERRING MD:  Dr. Manus Gunning, CHIEF COMPLAINT:  Seizure/ AMS  Brief History   32 year old male with ETOH hx with witnessed seizure and fall at home.  First seizure.  Seized again in ER and intubated for airway protection.  CTH noted for small IPH.  History of present illness   HPI obtained from medical chart review and per parents at bedside as patient is intubated and sedated on mechanical ventilation.  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  Parents are visiting from out of town since yesterday.  Parents were having a "discussion" with him tonight over his alcohol use.  Known EtOH use is several shots, 2-4 of liquor and beer and their presence.  Last use last evening, but parents think patient has cut back on his usage since their arrival. Patient walked into the kitchen and mother noted his eyes rolled back and patient fell from standing position to the floor.  Mother states he hit his head hard and then had apparent seizure lasting from 4 to 10 minutes per parents.   On arrival to ER patient was postictal GCS improved 14 in ER.  Labs noted for EtOH of 125, K3.3, AG 20, AST 198, ALT 89, platelet 61.  CT of head showed a small intraparenchymal hemorrhage in the right temporal lobe and questionable small subarachnoid hemorrhage with large right parietal occipital hematoma without fracture.  While in ER patient had another witnessed seizure and was combative postictally requiring intubation for airway protection.  Patient sedated on propofol and loaded with Keppra.  Neurology consulted.  PCCM to admit to ICU.  Past Medical History  PDA closure at age 74 ETOH use  Significant Hospital Events   11/28  Admitted  Consults:  Neurology  Procedures:  11/28 ETT >>  Significant Diagnostic Tests:  11/28 CTH >>  1. Small volume intraparenchymal hemorrhage in the right temporal lobe. Possible small adjacent subarachnoid hemorrhage versus motion artifact. 2. Right parietal-occipital scalp hematoma without skull fracture.  11/28 CXR >> ETT 4.6 cm above carina, low lung volumes, otherwise neg Micro Data:    Antimicrobials:   Interim history/subjective:  On propofol 50 mcg/kg/min  Objective   Blood pressure (!) 152/119, pulse (!) 130, resp. rate 15, height 5\' 11"  (1.803 m), weight 93 kg, SpO2 100 %.    Vent Mode: PRVC FiO2 (%):  [100 %] 100 % Set Rate:  [18 bmp] 18 bmp Vt Set:  [600 mL] 600 mL PEEP:  [5 cmH20] 5 cmH20 Plateau Pressure:  [13 cmH20] 13 cmH20   Intake/Output Summary (Last 24 hours) at 09/08/2018 0558 Last data filed at 09/08/2018 0518 Gross per 24 hour  Intake 100 ml  Output -  Net 100 ml   Filed Weights   09/08/18 0414  Weight: 93 kg    Examination: General:  Well nourished adult male in NAD on MV HEENT: MM pink/moist, pupils 4/brisk, ETT, OGT, large hematoma to right parietal/occipatal  Neuro: sedated, easily agitated/ coughs w/stimuliation, localizes to ETT, does not f/c, MAE CV: ST, no murmur, +2 pulses PULM: even/non-labored on full MV support, lungs bilaterally clear GI: soft, ND, +bs  Extremities: warm/dry, no edema Skin: no rashes, bruising to LEs,  skin tear to right great toe  Resolved Hospital Problem list    Assessment & Plan:  Seizures- presumed from ETOH withdrawal as parents suspect patient has cut back on his usage since their arrival.  No prior hx of seizures P:  Seizures precautions Neurology consulted in ER S/p load w/Keppra, continue 500 mg BID Continue with propofol gtt, add precedex and prn fentanyl RASS goal 0/-1 Scheduled ativan 1mg  per tube TID Daily folate, MVI, thiamine Monitor CBG q 4, add SSI if > 180 Trend daily  electrolyte levels, monitor UOP LR at 75 ml/hr  Small intraparenchymal hemorrhage in the right temporal lobe and questionable small subarachnoid hemorrhage  - thought related to witnessed fall - cspine cleared in ER P:  Repeat CTH in 6 hours given low platelets Hourly neuro checks May need NSGY consult  Acute respiratory insufficiency related to above P:  Full MV support, PRVC 8 cc/kg, rate 22 Trend CXR Wean FiO2 / PEEP for goal sat > 94% VAP protocol Daily SBT  Mild transaminitis  - related to suspected ETOH use P:  Trend LFTs  Thrombocytopenia - from suspected ETOH use P:  Trend  Monitor for bleeding SCDs only   Hypokalemia P:  KCL 40 meq x 1 Check Mag  Best practice:  Diet: NPO Pain/Anxiety/Delirium protocol (if indicated): as above VAP protocol (if indicated): yes DVT prophylaxis: SCDs only GI prophylaxis: pepcid daily Glucose control: monitor CBG Mobility: BR Code Status: Full Family Communication: Parents updated at bedside on plan of care Disposition: ICU  Labs   CBC: Recent Labs  Lab 09/08/18 0419  WBC 5.3  NEUTROABS 4.1  HGB 14.1  HCT 40.8  MCV 100.5*  PLT 61*    Basic Metabolic Panel: Recent Labs  Lab 09/08/18 0419  NA 140  K 3.3*  CL 101  CO2 19*  GLUCOSE 147*  BUN <5*  CREATININE 0.85  CALCIUM 9.2   GFR: Estimated Creatinine Clearance: 142.7 mL/min (by C-G formula based on SCr of 0.85 mg/dL). Recent Labs  Lab 09/08/18 0419  WBC 5.3    Liver Function Tests: Recent Labs  Lab 09/08/18 0419  AST 198*  ALT 89*  ALKPHOS 66  BILITOT 1.2  PROT 7.8  ALBUMIN 4.1   No results for input(s): LIPASE, AMYLASE in the last 168 hours. No results for input(s): AMMONIA in the last 168 hours.  ABG No results found for: PHART, PCO2ART, PO2ART, HCO3, TCO2, ACIDBASEDEF, O2SAT   Coagulation Profile: No results for input(s): INR, PROTIME in the last 168 hours.  Cardiac Enzymes: No results for input(s): CKTOTAL, CKMB, CKMBINDEX,  TROPONINI in the last 168 hours.  HbA1C: No results found for: HGBA1C  CBG: No results for input(s): GLUCAP in the last 168 hours.  Review of Systems:   unable  Past Medical History  He,  has a past medical history of Seizures (HCC).   Surgical History    Past Surgical History:  Procedure Laterality Date  . 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 He has never used smokeless tobacco. He reports that he drinks about 6.0 standard drinks of alcohol per week. He reports that he does not use drugs.   Family History   His family history is not on file.   Allergies No Known Allergies   Home Medications  Prior to Admission medications   Medication Sig Start Date End Date Taking? Authorizing Provider  pyridOXINE (VITAMIN B-6) 100 MG tablet Take 100 mg by mouth daily.   Yes [provider]  omeprazole (PRILOSEC) 20 MG capsule Take 1 capsule (20 mg total) by mouth 2 (two) times daily before a meal. Patient not taking: Reported on 08/30/2018 07/09/14   Azalia Bilis, MD  ondansetron (ZOFRAN ODT) 8 MG disintegrating tablet Take 1 tablet (8 mg total) by mouth every 8 (eight) hours as needed for nausea or vomiting. Patient not taking: Reported on 08/30/2018 07/09/14   Azalia Bilis, MD     Critical care time: 50 mins    Posey Boyer, [place] [facility]: [phone] or if no answer [phone] 09/08/2018, 7:03 AM

## 2018-09-08 NOTE — ED Notes (Signed)
Mittens ordered

## 2018-09-09 ENCOUNTER — Inpatient Hospital Stay (HOSPITAL_COMMUNITY): Payer: Self-pay

## 2018-09-09 DIAGNOSIS — F10239 Alcohol dependence with withdrawal, unspecified: Secondary | ICD-10-CM

## 2018-09-09 LAB — CBC WITH DIFFERENTIAL/PLATELET
Abs Immature Granulocytes: 0.03 10*3/uL (ref 0.00–0.07)
BASOS ABS: 0 10*3/uL (ref 0.0–0.1)
Basophils Relative: 1 %
Eosinophils Absolute: 0 10*3/uL (ref 0.0–0.5)
Eosinophils Relative: 0 %
HCT: 35.3 % — ABNORMAL LOW (ref 39.0–52.0)
Hemoglobin: 11.9 g/dL — ABNORMAL LOW (ref 13.0–17.0)
Immature Granulocytes: 1 %
Lymphocytes Relative: 13 %
Lymphs Abs: 0.6 10*3/uL — ABNORMAL LOW (ref 0.7–4.0)
MCH: 34.3 pg — ABNORMAL HIGH (ref 26.0–34.0)
MCHC: 33.7 g/dL (ref 30.0–36.0)
MCV: 101.7 fL — ABNORMAL HIGH (ref 80.0–100.0)
Monocytes Absolute: 0.5 10*3/uL (ref 0.1–1.0)
Monocytes Relative: 9 %
Neutro Abs: 3.7 10*3/uL (ref 1.7–7.7)
Neutrophils Relative %: 76 %
PLATELETS: 56 10*3/uL — AB (ref 150–400)
RBC: 3.47 MIL/uL — AB (ref 4.22–5.81)
RDW: 14.3 % (ref 11.5–15.5)
WBC: 4.9 10*3/uL (ref 4.0–10.5)
nRBC: 0 % (ref 0.0–0.2)

## 2018-09-09 LAB — BASIC METABOLIC PANEL WITH GFR
Anion gap: 9 (ref 5–15)
BUN: 7 mg/dL (ref 6–20)
CO2: 24 mmol/L (ref 22–32)
Calcium: 8.4 mg/dL — ABNORMAL LOW (ref 8.9–10.3)
Chloride: 105 mmol/L (ref 98–111)
Creatinine, Ser: 0.83 mg/dL (ref 0.61–1.24)
GFR calc Af Amer: >60 mL/min
GFR calc non Af Amer: >60 mL/min
Glucose, Bld: 91 mg/dL (ref 70–99)
Potassium: 3 mmol/L — ABNORMAL LOW (ref 3.5–5.1)
Sodium: 138 mmol/L (ref 135–145)

## 2018-09-09 LAB — RENAL FUNCTION PANEL
Albumin: 3.3 g/dL — ABNORMAL LOW (ref 3.5–5.0)
Anion gap: 10 (ref 5–15)
BUN: 7 mg/dL (ref 6–20)
CO2: 24 mmol/L (ref 22–32)
Calcium: 8.6 mg/dL — ABNORMAL LOW (ref 8.9–10.3)
Chloride: 103 mmol/L (ref 98–111)
Creatinine, Ser: 0.98 mg/dL (ref 0.61–1.24)
GFR calc Af Amer: >60 mL/min (ref >60)
GFR calc non Af Amer: >60 mL/min (ref >60)
Glucose, Bld: 84 mg/dL (ref 70–99)
Phosphorus: 2.8 mg/dL (ref 2.5–4.6)
Potassium: 2.5 mmol/L — CL (ref 3.5–5.1)
SODIUM: 137 mmol/L (ref 135–145)

## 2018-09-09 LAB — GLUCOSE, CAPILLARY
Glucose-Capillary: 89 mg/dL (ref 70–99)
Glucose-Capillary: 84 mg/dL (ref 70–99)
Glucose-Capillary: 94 mg/dL (ref 70–99)
Glucose-Capillary: 85 mg/dL (ref 70–99)
Glucose-Capillary: 75 mg/dL (ref 70–99)

## 2018-09-09 LAB — VITAMIN B12: Vitamin B-12: 426 pg/mL (ref 180–914)

## 2018-09-09 LAB — MAGNESIUM: Magnesium: 1.9 mg/dL (ref 1.7–2.4)

## 2018-09-09 LAB — LACTIC ACID, PLASMA
LACTIC ACID, VENOUS: 1.4 mmol/L (ref 0.5–1.9)
LACTIC ACID, VENOUS: 1.6 mmol/L (ref 0.5–1.9)

## 2018-09-09 LAB — HIV ANTIBODY (ROUTINE TESTING W REFLEX): HIV Screen 4th Generation wRfx: NONREACTIVE

## 2018-09-09 MED ORDER — VITAMIN B-6 100 MG PO TABS
100.0000 mg | ORAL_TABLET | Freq: Every day | ORAL | Status: DC
Start: 2018-09-09 — End: 2018-09-10
  Administered 2018-09-09: 100 mg via ORAL
  Filled 2018-09-09 (×2): qty 1

## 2018-09-09 MED ORDER — DEXMEDETOMIDINE HCL IN NACL 200 MCG/50ML IV SOLN
0.4000 ug/kg/h | INTRAVENOUS | Status: DC
  Administered 2018-09-09 (×2): 0.4 ug/kg/h via INTRAVENOUS
  Administered 2018-09-10: 0.8 ug/kg/h via INTRAVENOUS
  Administered 2018-09-10: 1.2 ug/kg/h via INTRAVENOUS
  Administered 2018-09-10: 0.5 ug/kg/h via INTRAVENOUS
  Filled 2018-09-09 (×6): qty 50

## 2018-09-09 MED ORDER — POTASSIUM CHLORIDE 20 MEQ/15ML (10%) PO SOLN
40.0000 meq | ORAL | Status: DC
  Administered 2018-09-09: 40 meq
  Filled 2018-09-09: qty 30

## 2018-09-09 MED ORDER — LORAZEPAM 2 MG/ML IJ SOLN
1.0000 mg | INTRAMUSCULAR | Status: DC | PRN
  Administered 2018-09-10: 2 mg via INTRAVENOUS
  Administered 2018-09-10: 4 mg via INTRAVENOUS
  Administered 2018-09-10 (×2): 2 mg via INTRAVENOUS
  Filled 2018-09-09 (×3): qty 1

## 2018-09-09 MED ORDER — LABETALOL HCL 5 MG/ML IV SOLN
20.0000 mg | INTRAVENOUS | Status: DC | PRN
  Administered 2018-09-09 – 2018-09-15 (×8): 20 mg via INTRAVENOUS
  Filled 2018-09-09 (×8): qty 4

## 2018-09-09 MED ORDER — FENTANYL CITRATE (PF) 100 MCG/2ML IJ SOLN
25.0000 ug | INTRAMUSCULAR | Status: DC | PRN
  Administered 2018-09-12 (×2): 25 ug via INTRAVENOUS
  Filled 2018-09-09 (×2): qty 2

## 2018-09-09 MED ORDER — ACETAMINOPHEN 325 MG PO TABS
650.0000 mg | ORAL_TABLET | Freq: Four times a day (QID) | ORAL | Status: DC | PRN
  Administered 2018-09-09: 650 mg via ORAL
  Filled 2018-09-09: qty 2

## 2018-09-09 MED ORDER — ACETAMINOPHEN 650 MG RE SUPP
650.0000 mg | Freq: Four times a day (QID) | RECTAL | Status: DC | PRN
  Filled 2018-09-09: qty 1

## 2018-09-09 MED ORDER — FAMOTIDINE 20 MG PO TABS
20.0000 mg | ORAL_TABLET | Freq: Every day | ORAL | Status: DC
  Administered 2018-09-09 – 2018-09-13 (×2): 20 mg via ORAL
  Filled 2018-09-09 (×2): qty 1

## 2018-09-09 MED ORDER — LORAZEPAM 2 MG/ML IJ SOLN
1.0000 mg | INTRAMUSCULAR | Status: DC | PRN
  Administered 2018-09-09: 2 mg via INTRAVENOUS
  Administered 2018-09-09: 4 mg via INTRAVENOUS
  Filled 2018-09-09 (×2): qty 2
  Filled 2018-09-09: qty 1

## 2018-09-09 MED ORDER — LORAZEPAM 1 MG PO TABS: 1.0000 mg | ORAL_TABLET | ORAL | Status: DC | PRN

## 2018-09-09 MED ORDER — POTASSIUM CHLORIDE CRYS ER 20 MEQ PO TBCR
40.0000 meq | EXTENDED_RELEASE_TABLET | ORAL | Status: AC
  Administered 2018-09-09 (×2): 40 meq via ORAL
  Filled 2018-09-09 (×2): qty 2

## 2018-09-09 NOTE — Progress Notes (Signed)
eLink Physician-Brief Progress Note Patient Name: Darren Diaz DOB: 04/17/1986 MRN: [id number]   Date of Service  09/09/2018  HPI/Events of Note  New onset fever in patient intubated for airway protection in the setting of ETOH and seizure activity and small IPH.  HD stable  eICU Interventions  Plan: Blood/urine cultures Tylenol for fever Hold on ABX for now PCXR and CBC with diff     Intervention Category Intermediate Interventions: Other:  DETERDING,ELIZABETH 09/09/2018, 3:33 AM

## 2018-09-09 NOTE — Procedures (Signed)
Extubation Procedure Note  Patient Details:   Name: Darren Diaz DOB: [birth year] MRN: [id number]   Airway Documentation:    Vent end date: 09/09/18 Vent end time: 0914   Evaluation  O2 sats: stable throughout Complications: No apparent complications Patient did tolerate procedure well. Bilateral Breath Sounds: Clear, Diminished   Yes: The patient was able to breathe around the cuff prior to extubation and speak post extubation.  Nell Range 09/09/2018, 9:14 AM

## 2018-09-09 NOTE — Progress Notes (Signed)
Subjective: Some improvement overnight.   Exam: Vitals:   09/09/18 0914 09/09/18 0915  BP:    Pulse:  (!) 101  Resp:  (!) 28  Temp:    SpO2: 93% 93%   Gen: In bed, NAD Resp: non-labored breathing, no acute distress Abd: soft, nt  Neuro: MS: Awake, follows commands YT:KPTWS, face symmetric Motor: moves all extremities against gravity.  Sensory:endorses sensation bilaterally.   Pertinent Labs: B12 426 B1 pending.    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 Unclear to me if he hit his head when he fell earlier in the evening(e.g. Hematoma -> seizure) or if the head hit when he had his seizure(e.g. etoh withdrawal seizure -> hematoma). At this point, I would continue keppra. I suspect EtOH withdrawal will be playing a role in his hospitalization.   For the paresthesias with preserved reflexes, I would like to get more history from the patient once he is extubated. He has normal imaging which makes MS/myelitis much less likely. Alcoholic neuropathy, B1 deficiency are both possibilities. Once further history is available, will likely broaden differential.   Recommendations: 1) f/u B1 2) continue keppra 500mg  BID 3) will follow.   Ritta Slot, MD [facility] [phone]  If 7pm- 7am, please page neurology on call as listed in AMION.

## 2018-09-09 NOTE — Progress Notes (Signed)
..  [facility] ADULT ICU REPLACEMENT PROTOCOL FOR AM LAB REPLACEMENT ONLY  The patient does apply for the [facility] Adult ICU Electrolyte Replacment Protocol based on the criteria listed below:   1. Is GFR >/= 40 ml/min? Yes.    Patient's GFR today is >60 2. Is urine output >/= 0.5 ml/kg/hr for the last 6 hours? Yes.   Patient's UOP is 0.62 ml/kg/hr 3. Is BUN < 60 mg/dL? Yes.    Patient's BUN today is 7 4. Abnormal electrolyte(s): K+ 2.5  5. Ordered repletion with: protocol 6. If a panic level lab has been reported, has the CCM MD in charge been notified? Yes.  .   Physician:  Dr. Bea Laura Deterding  Darren Diaz 09/09/2018 5:24 AM

## 2018-09-09 NOTE — Progress Notes (Signed)
NAME:  Darren Diaz, MRN:  [id number], DOB:  [birth year], LOS: 1 ADMISSION DATE:  09/08/2018, CONSULTATION DATE:  09/08/2018 REFERRING MD:  Dr. Manus Gunning, CHIEF COMPLAINT:  Seizure/ AMS  Brief History   32 year old male with ETOH hx with witnessed seizure and fall at home.  First seizure.  Seized again in ER and intubated for airway protection.  CTH noted for small IPH.  Past Medical History  PDA closure at age 74  Significant Hospital Events   11/28 Admitted 11/29 fever  Consults:  Neurology Neurosurgery s/o  Procedures:  11/28 ETT >> 11/29  Significant Diagnostic Tests:  11/28 CTH >> small volume ICH in Rt temporal lobe, possible small adjacent SAH, Rt scalp hematoma 11/28 EEG >> sedated state. MRI brain, c spine, t spine 11/28 >> subarachnoid blood in Rt temporal lobe, volume loss, Rt scalp hematoma, small protrusion T6-T7  Micro Data:  Blood 11/29 >> Urine 11/29 >>  Antimicrobials:   Interim history/subjective:  Tolerating SBT.  Objective   Blood pressure 130/90, pulse 73, temperature 100 F (37.8 C), temperature source Rectal, resp. rate 18, height 5\' 11"  (1.803 m), weight 93 kg, SpO2 100 %.    Vent Mode: PRVC FiO2 (%):  [40 %] 40 % Set Rate:  [18 bmp] 18 bmp Vt Set:  [600 mL] 600 mL PEEP:  [5 cmH20] 5 cmH20 Plateau Pressure:  [15 cmH20-20 cmH20] 16 cmH20   Intake/Output Summary (Last 24 hours) at 09/09/2018 0855 Last data filed at 09/09/2018 8182 Gross per 24 hour  Intake 2701.6 ml  Output 700 ml  Net 2001.6 ml   Filed Weights   09/08/18 0414  Weight: 93 kg    Examination:  General - sedated Eyes - pupils reactive ENT - ETT in place Cardiac - regular rate/rhythm, no murmur Chest - equal breath sounds b/l, no wheezing or rales Abdomen - soft, non tender, + bowel sounds Extremities - no cyanosis, clubbing, or edema Skin - no rashes Neuro - RASS -1, follows simple commands  CXR 11/29 (reviewed by me) > no infiltrate   Assessment & Plan:    Seizure likely from ETOH withdrawal. Plan: - prn ativan for CIWA > 8 - keppra per neurology - thiamine, folic acid, MVI - might need precedex after extubation  Acute respiratory failure with compromised airway. Plan - extubate 11/29  Hypokalemia. Plan - replace as needed  Thrombocytopenia likely in setting of ETOH. Plan - f/u CBC  Fever >> no other signs of infection. Plan - f/u cultures  Best practice:  Diet: clear liquids DVT prophylaxis: SCDs only GI prophylaxis: pepcid Mobility: bed rest Code Status: Full Family Communication: no family at bedside  Labs    CMP Latest Ref Rng & Units 09/09/2018 09/08/2018 07/08/2014  Glucose 70 - 99 mg/dL 84 993(Z) 85  BUN 6 - 20 mg/dL 7 <1(I) 9  Creatinine 9.67 - 1.24 mg/dL 8.93 8.10 1.75  Sodium 135 - 145 mmol/L 137 140 141  Potassium 3.5 - 5.1 mmol/L 2.5(LL) 3.3(L) 4.0  Chloride 98 - 111 mmol/L 103 101 102  CO2 22 - 32 mmol/L 24 19(L) 23  Calcium 8.9 - 10.3 mg/dL 1.0(C) 9.2 9.3  Total Protein 6.5 - 8.1 g/dL - 7.8 8.2  Total Bilirubin 0.3 - 1.2 mg/dL - 1.2 0.3  Alkaline Phos 38 - 126 U/L - 66 63  AST 15 - 41 U/L - 198(H) 67(H)  ALT 0 - 44 U/L - 89(H) 62(H)   CBC Latest Ref Rng & Units 09/09/2018  09/08/2018 07/08/2014  WBC 4.0 - 10.5 K/uL 4.9 5.3 5.4  Hemoglobin 13.0 - 17.0 g/dL 11.9(L) 14.1 15.7  Hematocrit 39.0 - 52.0 % 35.3(L) 40.8 43.2  Platelets 150 - 400 K/uL 56(L) 61(L) 142(L)   CBG (last 3)  Recent Labs    09/08/18 2324 09/09/18 0316 09/09/18 0755  GLUCAP 75 89 84   ABG    Component Value Date/Time   PHART 7.342 (L) 09/08/2018 0608   PCO2ART 45.3 09/08/2018 0608   PO2ART 431.0 (H) 09/08/2018 0608   HCO3 24.6 09/08/2018 0608   TCO2 26 09/08/2018 0608   ACIDBASEDEF 1.0 09/08/2018 0608   O2SAT 100.0 09/08/2018 1610     Coralyn Helling, MD [facility] 09/09/2018, 9:15 AM

## 2018-09-10 LAB — BASIC METABOLIC PANEL
Anion gap: 14 (ref 5–15)
Anion gap: 14 (ref 5–15)
BUN: <5 mg/dL — ABNORMAL LOW (ref 6–20)
BUN: <5 mg/dL — ABNORMAL LOW (ref 6–20)
CO2: 22 mmol/L (ref 22–32)
CO2: 20 mmol/L — ABNORMAL LOW (ref 22–32)
Calcium: 8.5 mg/dL — ABNORMAL LOW (ref 8.9–10.3)
Calcium: 8.6 mg/dL — ABNORMAL LOW (ref 8.9–10.3)
Chloride: 104 mmol/L (ref 98–111)
Chloride: 105 mmol/L (ref 98–111)
Creatinine, Ser: 0.77 mg/dL (ref 0.61–1.24)
Creatinine, Ser: 0.86 mg/dL (ref 0.61–1.24)
GFR calc Af Amer: >60 mL/min (ref >60)
GFR calc non Af Amer: >60 mL/min (ref >60)
GFR calc non Af Amer: >60 mL/min (ref >60)
Glucose, Bld: 99 mg/dL (ref 70–99)
Glucose, Bld: 118 mg/dL — ABNORMAL HIGH (ref 70–99)
Potassium: 3.2 mmol/L — ABNORMAL LOW (ref 3.5–5.1)
Potassium: 3.1 mmol/L — ABNORMAL LOW (ref 3.5–5.1)
Sodium: 140 mmol/L (ref 135–145)
Sodium: 139 mmol/L (ref 135–145)

## 2018-09-10 LAB — CBC
HCT: 34.4 % — ABNORMAL LOW (ref 39.0–52.0)
Hemoglobin: 11.5 g/dL — ABNORMAL LOW (ref 13.0–17.0)
MCH: 33.8 pg (ref 26.0–34.0)
MCHC: 33.4 g/dL (ref 30.0–36.0)
MCV: 101.2 fL — AB (ref 80.0–100.0)
Platelets: 62 10*3/uL — ABNORMAL LOW (ref 150–400)
RBC: 3.4 MIL/uL — ABNORMAL LOW (ref 4.22–5.81)
RDW: 13.5 % (ref 11.5–15.5)
WBC: 6.1 10*3/uL (ref 4.0–10.5)
nRBC: 0 % (ref 0.0–0.2)

## 2018-09-10 LAB — PHOSPHORUS: Phosphorus: 2.7 mg/dL (ref 2.5–4.6)

## 2018-09-10 LAB — MAGNESIUM: MAGNESIUM: 1.7 mg/dL (ref 1.7–2.4)

## 2018-09-10 LAB — TSH: TSH: 3.228 u[IU]/mL (ref 0.350–4.500)

## 2018-09-10 MED ORDER — HYDRALAZINE HCL 20 MG/ML IJ SOLN
10.0000 mg | INTRAMUSCULAR | Status: DC | PRN
  Administered 2018-09-10 – 2018-09-14 (×8): 10 mg via INTRAVENOUS
  Filled 2018-09-10 (×8): qty 1

## 2018-09-10 MED ORDER — LORAZEPAM 2 MG/ML IJ SOLN
INTRAMUSCULAR | Status: AC
  Filled 2018-09-10: qty 2

## 2018-09-10 MED ORDER — THIAMINE HCL 100 MG/ML IJ SOLN
100.0000 mg | Freq: Every day | INTRAMUSCULAR | Status: DC
  Administered 2018-09-11 – 2018-09-14 (×4): 100 mg via INTRAVENOUS
  Filled 2018-09-10 (×5): qty 2

## 2018-09-10 MED ORDER — PHENOBARBITAL SODIUM 65 MG/ML IJ SOLN: 65.0000 mg | Freq: Every day | INTRAMUSCULAR | Status: DC

## 2018-09-10 MED ORDER — PHENOBARBITAL SODIUM 65 MG/ML IJ SOLN: 65.0000 mg | Freq: Once | INTRAMUSCULAR | Status: AC

## 2018-09-10 MED ORDER — DEXMEDETOMIDINE HCL IN NACL 200 MCG/50ML IV SOLN: 0.4000 ug/kg/h | INTRAVENOUS | Status: DC

## 2018-09-10 MED ORDER — CHLORHEXIDINE GLUCONATE 0.12 % MT SOLN
15.0000 mL | Freq: Two times a day (BID) | OROMUCOSAL | Status: DC
  Administered 2018-09-10 – 2018-09-14 (×9): 15 mL via OROMUCOSAL
  Filled 2018-09-10 (×4): qty 15

## 2018-09-10 MED ORDER — POTASSIUM CHLORIDE 10 MEQ/100ML IV SOLN
10.0000 meq | INTRAVENOUS | Status: AC
  Administered 2018-09-10 (×3): 10 meq via INTRAVENOUS
  Filled 2018-09-10 (×3): qty 100

## 2018-09-10 MED ORDER — VITAMIN B-1 100 MG PO TABS: 100.0000 mg | ORAL_TABLET | Freq: Every day | ORAL | Status: DC

## 2018-09-10 MED ORDER — FOLIC ACID 1 MG PO TABS: 1.0000 mg | ORAL_TABLET | Freq: Every day | ORAL | Status: DC

## 2018-09-10 MED ORDER — KCL-LACTATED RINGERS 20 MEQ/L IV SOLN
INTRAVENOUS | Status: DC
  Administered 2018-09-10: 11:00:00 via INTRAVENOUS
  Filled 2018-09-10 (×3): qty 1000

## 2018-09-10 MED ORDER — LORAZEPAM 2 MG/ML IJ SOLN
1.0000 mg | INTRAMUSCULAR | Status: DC | PRN
  Administered 2018-09-10 (×3): 2 mg via INTRAVENOUS
  Filled 2018-09-10 (×3): qty 1

## 2018-09-10 MED ORDER — M.V.I. ADULT IV INJ
Freq: Once | INTRAVENOUS | Status: AC
  Administered 2018-09-10: 10:00:00 via INTRAVENOUS
  Filled 2018-09-10: qty 10

## 2018-09-10 MED ORDER — DEXMEDETOMIDINE HCL IN NACL 400 MCG/100ML IV SOLN
0.4000 ug/kg/h | INTRAVENOUS | Status: DC
  Administered 2018-09-10 – 2018-09-12 (×19): 1.2 ug/kg/h via INTRAVENOUS
  Administered 2018-09-13 (×5): 1.5 ug/kg/h via INTRAVENOUS
  Administered 2018-09-13: 1.2 ug/kg/h via INTRAVENOUS
  Administered 2018-09-13: 1.5 ug/kg/h via INTRAVENOUS
  Administered 2018-09-13: 1.2 ug/kg/h via INTRAVENOUS
  Administered 2018-09-14: 0.8 ug/kg/h via INTRAVENOUS
  Administered 2018-09-14 (×2): 1.5 ug/kg/h via INTRAVENOUS
  Administered 2018-09-14: 1 ug/kg/h via INTRAVENOUS
  Administered 2018-09-15: 0.6 ug/kg/h via INTRAVENOUS
  Filled 2018-09-10 (×2): qty 100
  Filled 2018-09-10: qty 200
  Filled 2018-09-10 (×5): qty 100
  Filled 2018-09-10: qty 200
  Filled 2018-09-10 (×4): qty 100
  Filled 2018-09-10 (×3): qty 200
  Filled 2018-09-10: qty 100
  Filled 2018-09-10: qty 200
  Filled 2018-09-10: qty 100
  Filled 2018-09-10: qty 200
  Filled 2018-09-10 (×5): qty 100

## 2018-09-10 MED ORDER — DIPHENHYDRAMINE HCL 50 MG/ML IJ SOLN
25.0000 mg | Freq: Four times a day (QID) | INTRAMUSCULAR | Status: DC | PRN
  Administered 2018-09-10 – 2018-09-13 (×5): 25 mg via INTRAVENOUS
  Filled 2018-09-10 (×5): qty 1

## 2018-09-10 MED ORDER — LORAZEPAM 2 MG/ML IJ SOLN
1.0000 mg | INTRAMUSCULAR | Status: DC | PRN
  Administered 2018-09-10: 2 mg via INTRAVENOUS
  Administered 2018-09-11 (×5): 4 mg via INTRAVENOUS
  Administered 2018-09-11: 2 mg via INTRAVENOUS
  Administered 2018-09-11 – 2018-09-13 (×7): 4 mg via INTRAVENOUS
  Administered 2018-09-14: 2 mg via INTRAVENOUS
  Administered 2018-09-14: 4 mg via INTRAVENOUS
  Filled 2018-09-10: qty 1
  Filled 2018-09-10 (×13): qty 2
  Filled 2018-09-10: qty 1
  Filled 2018-09-10 (×3): qty 2

## 2018-09-10 MED ORDER — PHENOBARBITAL SODIUM 65 MG/ML IJ SOLN
65.0000 mg | Freq: Every day | INTRAMUSCULAR | Status: DC
  Administered 2018-09-10 (×2): 65 mg via INTRAVENOUS
  Filled 2018-09-10 (×2): qty 1

## 2018-09-10 MED ORDER — ORAL CARE MOUTH RINSE
15.0000 mL | Freq: Two times a day (BID) | OROMUCOSAL | Status: DC
  Administered 2018-09-11 – 2018-09-14 (×7): 15 mL via OROMUCOSAL

## 2018-09-10 MED ORDER — DEXMEDETOMIDINE HCL IN NACL 200 MCG/50ML IV SOLN: 0.2000 ug/kg/h | INTRAVENOUS | Status: DC

## 2018-09-10 NOTE — Progress Notes (Addendum)
Pt became physically aggressive, trying to punch, kick and bite, while being cleaned up, requiring 5 people to successfully restrain him.  Pt is already maxed out on precedex and has been given ativan as well.

## 2018-09-10 NOTE — Progress Notes (Signed)
NAME:  Darren Diaz, MRN:  [id number], DOB:  05-30-1986, LOS: 2 ADMISSION DATE:  09/08/2018, CONSULTATION DATE:  09/08/2018 REFERRING MD:  Dr. Manus Gunning, CHIEF COMPLAINT:  Seizure/ AMS  Brief History   32 year old male with ETOH hx with witnessed seizure and fall at home.  First seizure.  Seized again in ER and intubated for airway protection.  CTH noted for small IPH.  Past Medical History  PDA closure at age 38  Significant Hospital Events   11/28 Admitted 11/29 fever  Consults:  Neurology Neurosurgery s/o  Procedures:  11/28 ETT >> 11/29  Significant Diagnostic Tests:  11/28 CTH >> small volume ICH in Rt temporal lobe, possible small adjacent SAH, Rt scalp hematoma 11/28 EEG >> sedated state. MRI brain, c spine, t spine 11/28 >> subarachnoid blood in Rt temporal lobe, volume loss, Rt scalp hematoma, small protrusion T6-T7  Micro Data:  Blood 11/29 >> Urine 11/29 >>  Antimicrobials:   Interim history/subjective:  Extubated yesterday morning. Initially calm on Precedex. Started becoming agitated at 0200.  Objective   Blood pressure (!) 190/101, pulse 71, temperature 98.7 F (37.1 C), temperature source Axillary, resp. rate 13, height 5\' 11"  (1.803 m), weight 94.4 kg, SpO2 97 %.    Vent Mode: PRVC FiO2 (%):  [40 %] 40 % Set Rate:  [18 bmp] 18 bmp Vt Set:  [600 mL] 600 mL PEEP:  [5 cmH20] 5 cmH20 Plateau Pressure:  [16 cmH20] 16 cmH20   Intake/Output Summary (Last 24 hours) at 09/10/2018 0731 Last data filed at 09/10/2018 0600 Gross per 24 hour  Intake 2117.66 ml  Output 1575 ml  Net 542.66 ml   Filed Weights   09/08/18 0414 09/10/18 0444  Weight: 93 kg 94.4 kg    Examination:  Physical Exam  Constitutional: He is well-developed, well-nourished, and in no distress.  In four-point restraints. easily agitated.  Trying to get out of bed.  HENT:  Head: Normocephalic and atraumatic.  Neck: Neck supple.  Cardiovascular: Normal rate, regular rhythm and normal  heart sounds.  Pulmonary/Chest: Effort normal and breath sounds normal. No stridor. No respiratory distress.  Abdominal: Soft.  Genitourinary: Penis normal.  Genitourinary Comments: Condom catheter in place.  Musculoskeletal: He exhibits no deformity.  Neurological: He has normal strength. He is agitated. He displays no tremor.     CXR 11/29 (reviewed by me) > no infiltrate   Assessment & Plan:   Remains critically ill due to delirium tremens requiring titration of Precedex infusion and frequent administration of benzodiazepines.  At high risk for further cardiovascular collapse and respiratory suppression from sedative medications. Seizure likely from ETOH withdrawal and indicate high risk for severe delirium tremens. Acute respiratory failure with compromised airway now resolved.  At risk of intubation from oversedation. Hypokalemia and hypomagnesemia related to alcohol abuse Thrombocytopenia due to alcohol related marrow suppression. Fever no other signs of infection, now resolved likely due to autonomic hyperactivity from alcohol withdrawal. Small temporal ICH, unlikely contributing to current mental status although more substantial concussive type injury possible.  Plan  Continue monitoring of respiratory status. Aggressive benzodiazepine maintenance to target CIWA score greater than 8. Load with Phenobarbital - long half-life for slow taper. Continue titration of adjunctive dexmedetomidine. Continue Keppra for seizure prophylaxis. Continue thiamine and multivitamin supplementation Magnesium and potassium supplementation. Monitor CBC.  Best practice:  Diet: clear liquids DVT prophylaxis: SCDs only GI prophylaxis: pepcid Mobility: bed rest Code Status: Full Family Communication: no family at bedside  Labs  CMP Latest Ref Rng & Units 09/10/2018 09/09/2018 09/09/2018  Glucose 70 - 99 mg/dL 99 91 84  BUN 6 - 20 mg/dL <1(O) 7 7  Creatinine 1.09 - 1.24 mg/dL 6.04 5.40  9.81  Sodium 135 - 145 mmol/L 140 138 137  Potassium 3.5 - 5.1 mmol/L 3.2(L) 3.0(L) 2.5(LL)  Chloride 98 - 111 mmol/L 104 105 103  CO2 22 - 32 mmol/L 22 24 24   Calcium 8.9 - 10.3 mg/dL 1.9(J) 4.7(W) 2.9(F)  Total Protein 6.5 - 8.1 g/dL - - -  Total Bilirubin 0.3 - 1.2 mg/dL - - -  Alkaline Phos 38 - 126 U/L - - -  AST 15 - 41 U/L - - -  ALT 0 - 44 U/L - - -   CBC Latest Ref Rng & Units 09/10/2018 09/09/2018 09/08/2018  WBC 4.0 - 10.5 K/uL 6.1 4.9 5.3  Hemoglobin 13.0 - 17.0 g/dL 11.5(L) 11.9(L) 14.1  Hematocrit 39.0 - 52.0 % 34.4(L) 35.3(L) 40.8  Platelets 150 - 400 K/uL 62(L) 56(L) 61(L)   CBG (last 3)  Recent Labs    09/09/18 1127 09/09/18 1624 09/09/18 2323  GLUCAP 94 85 75   ABG    Component Value Date/Time   PHART 7.342 (L) 09/08/2018 0608   PCO2ART 45.3 09/08/2018 0608   PO2ART 431.0 (H) 09/08/2018 0608   HCO3 24.6 09/08/2018 0608   TCO2 26 09/08/2018 0608   ACIDBASEDEF 1.0 09/08/2018 0608   O2SAT 100.0 09/08/2018 0608   CT head 11/29 (personally reviewed): More cerebral atrophy than expected for age.  Small temporal ICH.  Total critical care time: .  Lynnell Catalan, MD [facility] ICU Physician [facility] [place] [facility]  Pager: [phone] Mobile: [phone] After hours: [phone].  09/10/2018, 7:31 AM

## 2018-09-10 NOTE — Progress Notes (Signed)
Unable to get an accurate blood pressure due to patient thrashing around in bed and constantly moving his arms around.   I have attempted both arms and legs to get an accurate pressure but have been unsuccessful.    Sherrie George, RN BSN

## 2018-09-10 NOTE — Progress Notes (Signed)
Patient became extremely agitated, pulling at lines and EKG cords. Attempted redirection and increased precedex gtt. This was unsuccessful and patient attempted to get up and leave.   He became much more aggravated and began kicking and bucking. Patient was restrained and ativan administered. Precedex gtt increased and 5pt restraints order given and applied.   Order for safety sitter has been submitted.  Will continue to monitor.   Sherrie George, RN BSN

## 2018-09-10 NOTE — Progress Notes (Signed)
Pt given dose of ativan for severe agitation with no resolve.  Will continue to monitor.

## 2018-09-10 NOTE — Progress Notes (Signed)
Patient has been extubated, though he is still very sedated due to agitation from his alcohol withdrawal.   On my exam, he awakens temporarily, but motions me away when I asked him to do anything.  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 Unclear to me if he hit his head when he fell earlier in the evening(e.g. Hematoma -> seizure) or if the head hit when he had his seizure(e.g. etoh withdrawal seizure -> hematoma). At this point, I would continue keppra. I suspect EtOH withdrawal will be playing a role in his hospitalization.   For the paresthesias with preserved reflexes, I would like to get more history from the patient once he is extubated. He has normal imaging which makes MS/myelitis much less likely. Alcoholic neuropathy, B1 deficiency are both possibilities. Once further history is available, will likely broaden differential.   Recommendations: 1)  thiamine, multivitamin supplementation 2) continue keppra 500mg  BID 3) will follow.   Ritta Slot, MD [facility] [phone]  If 7pm- 7am, please page neurology on call as listed in AMION.

## 2018-09-11 ENCOUNTER — Inpatient Hospital Stay (HOSPITAL_COMMUNITY): Payer: Self-pay

## 2018-09-11 DIAGNOSIS — F10231 Alcohol dependence with withdrawal delirium: Secondary | ICD-10-CM

## 2018-09-11 LAB — BASIC METABOLIC PANEL
Anion gap: 13 (ref 5–15)
BUN: 6 mg/dL (ref 6–20)
CO2: 23 mmol/L (ref 22–32)
Calcium: 8.8 mg/dL — ABNORMAL LOW (ref 8.9–10.3)
Chloride: 104 mmol/L (ref 98–111)
Creatinine, Ser: 0.88 mg/dL (ref 0.61–1.24)
GFR calc Af Amer: >60 mL/min (ref >60)
GFR calc non Af Amer: >60 mL/min (ref >60)
Glucose, Bld: 110 mg/dL — ABNORMAL HIGH (ref 70–99)
Potassium: 3 mmol/L — ABNORMAL LOW (ref 3.5–5.1)
Sodium: 140 mmol/L (ref 135–145)

## 2018-09-11 MED ORDER — PHENOBARBITAL SODIUM 65 MG/ML IJ SOLN
200.0000 mg | Freq: Once | INTRAMUSCULAR | Status: AC
  Administered 2018-09-11: 200 mg via INTRAVENOUS
  Filled 2018-09-11: qty 4

## 2018-09-11 MED ORDER — SODIUM CHLORIDE 0.9 % IV SOLN
INTRAVENOUS | Status: DC | PRN
  Administered 2018-09-11 – 2018-09-14 (×4): via INTRAVENOUS

## 2018-09-11 MED ORDER — NICOTINE 21 MG/24HR TD PT24
21.0000 mg | MEDICATED_PATCH | Freq: Every day | TRANSDERMAL | Status: DC
  Administered 2018-09-11 – 2018-09-14 (×4): 21 mg via TRANSDERMAL
  Filled 2018-09-11 (×4): qty 1

## 2018-09-11 MED ORDER — POTASSIUM CHLORIDE 2 MEQ/ML IV SOLN
INTRAVENOUS | Status: DC
  Administered 2018-09-11 – 2018-09-17 (×9): via INTRAVENOUS
  Filled 2018-09-11 (×14): qty 1000

## 2018-09-11 MED ORDER — POTASSIUM CHLORIDE 10 MEQ/100ML IV SOLN
10.0000 meq | INTRAVENOUS | Status: AC
  Administered 2018-09-11 (×3): 10 meq via INTRAVENOUS
  Filled 2018-09-11 (×3): qty 100

## 2018-09-11 NOTE — Progress Notes (Signed)
Subjective: Continues to be in severe alcohol withdrawal, required phenobarbital and benzodiazepines  Exam: Vitals:   09/11/18 1830 09/11/18 1900  BP: (!) 180/110 (!) 166/109  Pulse: 77 84  Resp: (!) 25 (!) 26  Temp:    SpO2: 100% 100%   Gen: In bed, NAD Resp: non-labored breathing, no acute distress Abd: soft, nt  Neuro: MS: Lethargic but slightly agitated when aroused, follows commands readily CN: Pupils reactive bilaterally, aggressively keeps eyes clenched shut Motor: Moves all extremities spontaneously with good strength Sensory: Endorses sensation bilaterally DTR: 2+ and symmetric in the knees and ankles and biceps  Pertinent Labs: BMP- mildly hypokalemic B12- 440, TSH-normal    Impression: 32 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 Unclear to me if he hit his head when he fell earlier in the evening(e.g. Hematoma ->seizure) or if the head hit when he had his seizure(e.g. etoh withdrawal seizure ->hematoma). At this point, I would continue keppra. I suspect EtOH withdrawal will be playing a role in his hospitalization.   For the paresthesias with preserved reflexes, I would like to get more history from the patient once he is lucid.  With paresthesias and preserve reflexes, imaging of his neuraxis was undertaken to rule out MS/myelitis and the fact that it is normal makes this much less likely.   Alcoholic neuropathy, B1 deficiency are both possibilities. Once further history is available, may broaden differential. EMG/nerve conduction would also be helpful.  I would also like to repeat his imaging to make sure he does not have blooming contusions contributing to his mental status.  Recommendations: 1) continue treatment of alcohol withdrawal per critical care 2) repeat head CT, if this continues to be negative then I do not think any further imaging will be needed.  Ritta Slot, MD [facility] [phone]  If 7pm- 7am, please page neurology on  call as listed in AMION.

## 2018-09-11 NOTE — Progress Notes (Signed)
NAME:  Darren Diaz, MRN:  [id number], DOB:  1985-11-27, LOS: 3 ADMISSION DATE:  09/08/2018, CONSULTATION DATE:  09/08/2018 REFERRING MD:  Dr. Manus Gunning, CHIEF COMPLAINT:  Seizure/ AMS  Brief History   32 year old male with ETOH hx with witnessed seizure and fall at home.  First seizure.  Seized again in ER and intubated for airway protection.  CTH noted for small IPH.  Past Medical History  PDA closure at age 69  Significant Hospital Events   11/28 Admitted 11/29 fever  Consults:  Neurology Neurosurgery s/o  Procedures:  11/28 ETT >> 11/29  Significant Diagnostic Tests:  11/28 CTH >> small volume ICH in Rt temporal lobe, possible small adjacent SAH, Rt scalp hematoma 11/28 EEG >> sedated state. MRI brain, c spine, t spine 11/28 >> subarachnoid blood in Rt temporal lobe, volume loss, Rt scalp hematoma, small protrusion T6-T7  Micro Data:  Blood 11/29 >> Urine 11/29 >>  Antimicrobials:   Interim history/subjective:  Intermittent agitation last pm despite phenobarbital load.   Objective   Blood pressure (!) 157/101, pulse 89, temperature 98.6 F (37 C), temperature source Axillary, resp. rate (!) 21, height 5\' 11"  (1.803 m), weight 91.4 kg, SpO2 100 %.        Intake/Output Summary (Last 24 hours) at 09/11/2018 1021 Last data filed at 09/11/2018 1000 Gross per 24 hour  Intake 2626.77 ml  Output 3525 ml  Net -898.23 ml   Filed Weights   09/08/18 0414 09/10/18 0444 09/11/18 0522  Weight: 93 kg 94.4 kg 91.4 kg    Examination:  Physical Exam  Constitutional: He is well-developed, well-nourished, and in no distress.  In four-point restraints. easily agitated.  Trying to get out of bed. Generalized tremulousness.  HENT:  Head: Normocephalic and atraumatic.  Neck: Neck supple.  Cardiovascular: Regular rhythm and normal heart sounds. Tachycardia present.  Hypertensive  Pulmonary/Chest: Effort normal and breath sounds normal. No stridor. No respiratory distress.    Abdominal: Soft.  Genitourinary: Penis normal.  Genitourinary Comments: Condom catheter in place.  Musculoskeletal: He exhibits no deformity.  Neurological: He has normal motor skills and normal strength. He is agitated. He displays tremor.   CXR 11/29 (reviewed by me) > no infiltrate   Assessment & Plan:   Remains critically ill due to delirium tremens requiring titration of Precedex infusion and frequent administration of benzodiazepines.  At high risk for further cardiovascular collapse and respiratory suppression from sedative medications. Seizure likely from ETOH withdrawal and indicate high risk for severe delirium tremens. Acute respiratory failure with compromised airway now resolved.  At risk of intubation from oversedation. Hypokalemia and hypomagnesemia related to alcohol abuse Thrombocytopenia due to alcohol related marrow suppression - improving. Fever no other signs of infection, now resolved likely due to autonomic hyperactivity from alcohol withdrawal. Small temporal ICH, unlikely contributing to current mental status although more substantial concussive type injury possible.  Plan  Continue monitoring of respiratory status. Aggressive benzodiazepine maintenance to target CIWA score greater than 8. Reload with Phenobarbital - long half-life for slow taper. Continue titration of adjunctive dexmedetomidine. Continue Keppra for seizure prophylaxis. Continue thiamine and multivitamin supplementation Magnesium and potassium supplementation. Monitor CBC.  Best practice:  Diet: clear liquids DVT prophylaxis: SCDs only GI prophylaxis: pepcid Mobility: bed rest Code Status: Full Family Communication: no family at bedside  Labs    CMP Latest Ref Rng & Units 09/10/2018 09/10/2018 09/09/2018  Glucose 70 - 99 mg/dL 409(W) 99 91  BUN 6 - 20 mg/dL <1(X) <  5(L) 7  Creatinine 0.61 - 1.24 mg/dL 9.44 9.67 5.91  Sodium 135 - 145 mmol/L 139 140 138  Potassium 3.5 - 5.1 mmol/L  3.1(L) 3.2(L) 3.0(L)  Chloride 98 - 111 mmol/L 105 104 105  CO2 22 - 32 mmol/L 20(L) 22 24  Calcium 8.9 - 10.3 mg/dL 6.3(W) 4.6(K) 5.9(D)  Total Protein 6.5 - 8.1 g/dL - - -  Total Bilirubin 0.3 - 1.2 mg/dL - - -  Alkaline Phos 38 - 126 U/L - - -  AST 15 - 41 U/L - - -  ALT 0 - 44 U/L - - -   CBC Latest Ref Rng & Units 09/10/2018 09/09/2018 09/08/2018  WBC 4.0 - 10.5 K/uL 6.1 4.9 5.3  Hemoglobin 13.0 - 17.0 g/dL 11.5(L) 11.9(L) 14.1  Hematocrit 39.0 - 52.0 % 34.4(L) 35.3(L) 40.8  Platelets 150 - 400 K/uL 62(L) 56(L) 61(L)   CBG (last 3)  Recent Labs    09/09/18 1127 09/09/18 1624 09/09/18 2323  GLUCAP 94 85 75   ABG    Component Value Date/Time   PHART 7.342 (L) 09/08/2018 0608   PCO2ART 45.3 09/08/2018 0608   PO2ART 431.0 (H) 09/08/2018 0608   HCO3 24.6 09/08/2018 0608   TCO2 26 09/08/2018 0608   ACIDBASEDEF 1.0 09/08/2018 0608   O2SAT 100.0 09/08/2018 0608   CT head 11/29 (personally reviewed): More cerebral atrophy than expected for age.  Small temporal ICH.  Total critical care time: .  Lynnell Catalan, MD [facility] ICU Physician [facility]  Pager: [phone] Mobile: [phone] After hours: [phone].  09/11/2018, 10:21 AM

## 2018-09-11 NOTE — Progress Notes (Signed)
   09/11/18 1100  Clinical Encounter Type  Visited With Patient and family together  Visit Type Spiritual support  Responded to page for spiritual support per nurse. Patient was unresponsive in bed. Mother and father present at bedside. Mother states that son is detoxing. Mother request services of [employer] priest for Monday. Informed nurse to put in Spiritual care consult for Monday for the [facility] D[facility] visit. Provided emotional and spiritual support.

## 2018-09-12 LAB — BASIC METABOLIC PANEL
ANION GAP: 13 (ref 5–15)
BUN: 6 mg/dL (ref 6–20)
CO2: 22 mmol/L (ref 22–32)
Calcium: 8.6 mg/dL — ABNORMAL LOW (ref 8.9–10.3)
Chloride: 104 mmol/L (ref 98–111)
Creatinine, Ser: 0.7 mg/dL (ref 0.61–1.24)
GFR calc Af Amer: >60 mL/min (ref >60)
Glucose, Bld: 104 mg/dL — ABNORMAL HIGH (ref 70–99)
Potassium: 2.9 mmol/L — ABNORMAL LOW (ref 3.5–5.1)
Sodium: 139 mmol/L (ref 135–145)

## 2018-09-12 LAB — POTASSIUM: Potassium: 3 mmol/L — ABNORMAL LOW (ref 3.5–5.1)

## 2018-09-12 LAB — VITAMIN B1: Vitamin B1 (Thiamine): 173.4 nmol/L (ref 66.5–200.0)

## 2018-09-12 MED ORDER — FUROSEMIDE 10 MG/ML IJ SOLN
40.0000 mg | Freq: Once | INTRAMUSCULAR | Status: AC
  Administered 2018-09-12: 40 mg via INTRAVENOUS
  Filled 2018-09-12: qty 4

## 2018-09-12 MED ORDER — POTASSIUM CHLORIDE 10 MEQ/100ML IV SOLN
10.0000 meq | INTRAVENOUS | Status: AC
  Administered 2018-09-12 – 2018-09-13 (×6): 10 meq via INTRAVENOUS
  Filled 2018-09-12 (×5): qty 100

## 2018-09-12 MED ORDER — MAGNESIUM SULFATE 2 GM/50ML IV SOLN
2.0000 g | Freq: Once | INTRAVENOUS | Status: AC
  Administered 2018-09-12: 2 g via INTRAVENOUS
  Filled 2018-09-12: qty 50

## 2018-09-12 MED ORDER — POTASSIUM CHLORIDE 10 MEQ/100ML IV SOLN
10.0000 meq | INTRAVENOUS | Status: AC
  Administered 2018-09-12 (×6): 10 meq via INTRAVENOUS
  Filled 2018-09-12 (×6): qty 100

## 2018-09-12 MED ORDER — PHENOBARBITAL SODIUM 65 MG/ML IJ SOLN
130.0000 mg | Freq: Once | INTRAMUSCULAR | Status: AC
  Administered 2018-09-12: 130 mg via INTRAVENOUS
  Filled 2018-09-12: qty 2

## 2018-09-12 MED ORDER — FOLIC ACID 5 MG/ML IJ SOLN
1.0000 mg | Freq: Every day | INTRAMUSCULAR | Status: DC
  Administered 2018-09-12 – 2018-09-14 (×2): 1 mg via INTRAVENOUS
  Filled 2018-09-12 (×5): qty 0.2

## 2018-09-12 NOTE — Progress Notes (Addendum)
Subjective: Patient is very sedated at this point in time.  Morning in bed.  Grimaces to stimuli to the face Exam: Vitals:   09/12/18 0630 09/12/18 0700  BP: (!) 145/99 (!) 146/97  Pulse: 82 80  Resp: (!) 26 (!) 22  Temp:    SpO2: 98% 99%    Physical Exam   HEENT-  Normocephalic, no lesions, without obvious abnormality.  Normal external eye and conjunctiva.   Extremities- Warm, dry and intact Musculoskeletal- deformity or swelling Skin-warm and dry, multiple bruises in the legs   Neuro:  Mental Status: Patient is extremely sedated at this time.  Withdraws to noxious stimuli.  Does not follow verbal or oral commands Cranial Nerves: II: No blink to threat III,IV, VI: ptosis not present,  pupils equal, round, reactive to light and accommodation V,VII: Face symmetric, intact sensation when normal saline drops placed on eyelids secondary to being closed shut  Motor: Moving all extremities to stimuli. Tone and bulk:normal tone throughout; no atrophy noted Sensory: Drunk from noxious stimuli all 4 extremities Deep Tendon Reflexes: 2+ and symmetric throughout Plantars: Right: [place]   left: downgoing     Medications:  Scheduled: . chlorhexidine  15 mL Mouth Rinse BID  . famotidine  20 mg Oral QHS  . mouth rinse  15 mL Mouth Rinse q12n4p  . nicotine  21 mg Transdermal Daily  . thiamine injection  100 mg Intravenous Daily   Continuous: . sodium chloride 10 mL/hr at 09/11/18 1246  . dexmedetomidine (PRECEDEX) IV infusion 1.2 mcg/kg/hr (09/12/18 0850)  . lactated ringers with kcl 50 mL/hr at 09/12/18 0700  . levETIRAcetam Stopped (09/11/18 2133)  . potassium chloride 10 mEq (09/12/18 0811)   ZOX:WRUEAV chloride, acetaminophen, acetaminophen, diphenhydrAMINE, fentaNYL (SUBLIMAZE) injection, hydrALAZINE, labetalol, LORazepam  Pertinent Labs/Diagnostics: -Potassium 2.9   Ct Head Wo Contrast  Result Date: 09/11/2018 CLINICAL DATA:  Follow-up examination for acute  subarachnoid hemorrhage. EXAM: CT HEAD WITHOUT CONTRAST TECHNIQUE: Contiguous axial images were obtained from the base of the skull through the vertex without intravenous contrast. COMPARISON:  Prior CT from 09/09/2018 FINDINGS: Brain: Previously seen right subarachnoid hemorrhage slightly decreased as compared to previous exam. No new intracranial hemorrhage. No acute large vessel territory infarct. No mass lesion, midline shift or mass effect. No hydrocephalus. No extra-axial fluid collection. Vascular: No hyperdense vessel. Skull: Evolving multifocal scalp contusions noted. Calvarium intact. Sinuses/Orbits: Globes orbital soft tissues demonstrate no acute finding. Scattered mucosal thickening with layering opacity within the paranasal sinuses, similar to previous. Left mastoid effusions slightly increased. Other: None. IMPRESSION: 1. Slight interval decrease in small volume right subarachnoid hemorrhage. No other new acute intracranial abnormality. 2. Evolving multifocal scalp contusions. 3. Similar paranasal sinus disease with increasing left mastoid effusion. Electronically Signed   By: Rise Mu M.D.   On: 09/11/2018 23:33     Felicie Morn PA-C [facility] [phone]   Assessment: 32 year old male with traumatic head injury.  As noted is unclear if he hit his head when he fell earlier in the evening or if he hit his head when he had his seizure (alcohol withdrawal will be playing a role in his hospitalization)  As for the paresthesias with preserved reflexes in both the Achilles and patellas, we need more history once the patient is lucid.  Given preserved reflexes, imaging of the neural axis was obtained and was normal.  Alcohol neuropathy, B1 deficiency continue to be both possibilities.  Patient will need an EMG conduction study as an outpatient as this would  be helpful.  CT head shows interval decrease in temporal SAH.  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  Continue phenobarbital which has been started essentially for alcohol withdrawal. - Repeat head CT showed decrease in subarachnoid hemorrhage thus repeat head CT would not be needed at this time. - We will continue to follow the patient and get more information once patient is more lucid.  09/12/2018, 8:49 AM  Attending Neurohospitalist Addendum Patient seen and examined with APP/Resident. Agree with the history and physical as documented above. Agree with the plan as documented, which I helped formulate. I have independently reviewed the chart, obtained history, review of systems and examined the patient.I have personally reviewed pertinent head/neck/spine imaging (CT/MRI). Please feel free to call with any questions. --- Milon Dikes, MD [facility] Pager: [phone]  If 7pm to 7am, please call on call as listed on AMION.

## 2018-09-12 NOTE — Progress Notes (Signed)
Chaplain responded to spiritual care consult.  Pt's mother requested "Sacrament of the Sick."  Provided ministry of presence and will reach out to [employer] priest to request sacrament.  Will continue to follow. Lynnell Chad [phone] pager

## 2018-09-12 NOTE — Progress Notes (Signed)
eLink Physician-Brief Progress Note Patient Name: Darren Diaz DOB: 09-06-1986 MRN: [id number]   Date of Service  09/12/2018  HPI/Events of Note  K+ 2.9  eICU Interventions  Modified Elink electrolyte replacement orders entered (Pt has iv fluids containing K+)         U  09/12/2018, 6:56 AM

## 2018-09-12 NOTE — Progress Notes (Signed)
   NAME:  Darren Diaz, MRN:  [id number], DOB:  1986/06/21, LOS: 4 ADMISSION DATE:  09/08/2018, CONSULTATION DATE:  09/08/2018 REFERRING MD:  Dr. Manus Gunning, CHIEF COMPLAINT:  Seizure/ AMS  Brief History   32 year old male with ETOH hx with witnessed seizure and fall at home.  First seizure.  Seized again in ER and intubated for airway protection.  CTH noted for small IPH.  Past Medical History  PDA closure at age 70  Significant Hospital Events   11/28 Admitted 11/29 fever  Consults:  Neurology Neurosurgery s/o  Procedures:  11/28 ETT >> 11/29  Significant Diagnostic Tests:  11/28 CTH >> small volume ICH in Rt temporal lobe, possible small adjacent SAH, Rt scalp hematoma 11/28 EEG >> sedated state. MRI brain, c spine, t spine 11/28 >> subarachnoid blood in Rt temporal lobe, volume loss, Rt scalp hematoma, small protrusion T6-T7 CT head 12/1 > slight decrease in small volume SAH.  Evolving multifocal scalp contusions.  Micro Data:  Blood 11/29 >> Urine 11/29 >>  Antimicrobials:  None.  Interim history/subjective:  Extremely sedated now following 4mg  ativan administered roughly 3 hours ago.  Per RN, was very agitated prior to ativan despite 1.2 of precedex.  Objective   Blood pressure (!) 170/119, pulse 81, temperature 98.4 F (36.9 C), temperature source Oral, resp. rate (!) 25, height 5\' 11"  (1.803 m), weight 91.6 kg, SpO2 96 %.        Intake/Output Summary (Last 24 hours) at 09/12/2018 1010 Last data filed at 09/12/2018 0700 Gross per 24 hour  Intake 2307.34 ml  Output 850 ml  Net 1457.34 ml   Filed Weights   09/10/18 0444 09/11/18 0522 09/12/18 0530  Weight: 94.4 kg 91.4 kg 91.6 kg    Examination: General: Adult male, in NAD.  In bilateral wrist and posey belt restraints. Neuro: Minimally responsive due to sedation (received 4mg  ativan earlier this AM).  Does not follow commands. HEENT: [place]/AT. Sclerae anicteric. Cardiovascular: RRR, no M/R/G.  Lungs:  Respirations even and unlabored.  CTA bilaterally, No W/R/R. Abdomen: BS x 4, soft, NT/ND.  Musculoskeletal: No gross deformities, no edema.  Skin: Intact, warm, no rashes.   Assessment & Plan:   Seizures due to EtOH withdrawal. Delirium tremens. - Continue precedex, ativan PRN. - Redose phenobarb today, drop from 200 to 130mg  (Per RN, on 12/1, phenobarb seemed to have worked better than continuous PRN ativan had and with less sedative effect). - Caution with oversedation to avoid hemodynamic and respiratory compromise. - Continue empiric keppra. - Continue thiamine / folate.  Hypokalemia and hypomagnesemia related to alcohol abuse - K being replaced. - Add empiric 2g mag now. - Follow BMP.  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 - Monitor for bleeding. - Monitor platelet counts.  Small temporal ICH and SA. - Monitor clinically, no role surgical interventions. - Continue empiric keppra.  Parasthesias. 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  Best practice:  Diet: clear liquids DVT prophylaxis: SCDs only GI prophylaxis: pepcid Mobility: bed rest Code Status: Full Family Communication: Father and mother updated at bedside 12/2.   Rutherford Guys, [place] [facility] Pager: [phone]  or [phone] 09/12/2018, 10:37 AM

## 2018-09-12 NOTE — Progress Notes (Signed)
12:40 pm [facility] [place] [facility] [place], Father [place], administered the [employer] with parents and chaplain bedside.  Will continue to available as needed. Lynnell Chad Pager [phone]

## 2018-09-12 NOTE — Progress Notes (Signed)
eLink Physician-Brief Progress Note Patient Name: Jacey Devi DOB: 1985-11-05 MRN: [id number]   Date of Service  09/12/2018  HPI/Events of Note  K+ = 3.0 and Creatinine = 0.70. Patient also has K+ in LR IV fluid.   eICU Interventions  Will replace K+.      Intervention Category Major Interventions: Electrolyte abnormality - evaluation and management  Sommer,Steven Eugene 09/12/2018, 9:09 PM

## 2018-09-13 LAB — CBC
HCT: 34.9 % — ABNORMAL LOW (ref 39.0–52.0)
Hemoglobin: 12 g/dL — ABNORMAL LOW (ref 13.0–17.0)
MCH: 34.6 pg — AB (ref 26.0–34.0)
MCHC: 34.4 g/dL (ref 30.0–36.0)
MCV: 100.6 fL — ABNORMAL HIGH (ref 80.0–100.0)
Platelets: 125 10*3/uL — ABNORMAL LOW (ref 150–400)
RBC: 3.47 MIL/uL — ABNORMAL LOW (ref 4.22–5.81)
RDW: 13.2 % (ref 11.5–15.5)
WBC: 4.7 10*3/uL (ref 4.0–10.5)
nRBC: 0 % (ref 0.0–0.2)

## 2018-09-13 LAB — BASIC METABOLIC PANEL
Anion gap: 14 (ref 5–15)
BUN: 5 mg/dL — ABNORMAL LOW (ref 6–20)
CO2: 21 mmol/L — ABNORMAL LOW (ref 22–32)
Calcium: 8.4 mg/dL — ABNORMAL LOW (ref 8.9–10.3)
Chloride: 105 mmol/L (ref 98–111)
Creatinine, Ser: 0.73 mg/dL (ref 0.61–1.24)
GFR calc Af Amer: >60 mL/min (ref >60)
Glucose, Bld: 103 mg/dL — ABNORMAL HIGH (ref 70–99)
Potassium: 3.1 mmol/L — ABNORMAL LOW (ref 3.5–5.1)
Sodium: 140 mmol/L (ref 135–145)

## 2018-09-13 LAB — GLUCOSE, CAPILLARY: Glucose-Capillary: 93 mg/dL (ref 70–99)

## 2018-09-13 LAB — PHOSPHORUS: Phosphorus: 3.8 mg/dL (ref 2.5–4.6)

## 2018-09-13 LAB — MAGNESIUM: Magnesium: 1.5 mg/dL — ABNORMAL LOW (ref 1.7–2.4)

## 2018-09-13 MED ORDER — QUETIAPINE FUMARATE 50 MG PO TABS
50.0000 mg | ORAL_TABLET | Freq: Every day | ORAL | Status: DC
  Administered 2018-09-13 – 2018-09-20 (×8): 50 mg via ORAL
  Filled 2018-09-13: qty 2
  Filled 2018-09-13: qty 1
  Filled 2018-09-13: qty 2
  Filled 2018-09-13: qty 1
  Filled 2018-09-13: qty 2
  Filled 2018-09-13 (×3): qty 1

## 2018-09-13 MED ORDER — POTASSIUM CHLORIDE 10 MEQ/100ML IV SOLN
10.0000 meq | INTRAVENOUS | Status: AC
  Administered 2018-09-13 (×4): 10 meq via INTRAVENOUS
  Filled 2018-09-13 (×4): qty 100

## 2018-09-13 MED ORDER — WHITE PETROLATUM EX OINT
TOPICAL_OINTMENT | CUTANEOUS | Status: AC
  Administered 2018-09-13: 16:00:00
  Filled 2018-09-13: qty 28.35

## 2018-09-13 MED ORDER — SODIUM CHLORIDE 0.9 % IV BOLUS
1000.0000 mL | Freq: Once | INTRAVENOUS | Status: AC
  Administered 2018-09-13: 500 mL via INTRAVENOUS

## 2018-09-13 MED ORDER — MAGNESIUM SULFATE 2 GM/50ML IV SOLN
2.0000 g | Freq: Once | INTRAVENOUS | Status: AC
  Administered 2018-09-13: 2 g via INTRAVENOUS
  Filled 2018-09-13: qty 50

## 2018-09-13 NOTE — Progress Notes (Signed)
   NAME:  Darren Diaz, MRN:  [id number], DOB:  1986-04-29, LOS: 5 ADMISSION DATE:  09/08/2018, CONSULTATION DATE:  09/08/2018 REFERRING MD:  Dr. Manus Gunning, CHIEF COMPLAINT:  Seizure/ AMS  Brief History   32 year old male with ETOH hx with witnessed seizure and fall at home.  First seizure.  Seized again in ER and intubated for airway protection.  CTH noted for small IPH.  Past Medical History  PDA closure at age 53  Significant Hospital Events   11/28 Admitted 11/29 fever  Consults:  Neurology Neurosurgery s/o  Procedures:  11/28 ETT >> 11/29  Significant Diagnostic Tests:  11/28 CTH >> small volume ICH in Rt temporal lobe, possible small adjacent SAH, Rt scalp hematoma 11/28 EEG >> sedated state. MRI brain, c spine, t spine 11/28 >> subarachnoid blood in Rt temporal lobe, volume loss, Rt scalp hematoma, small protrusion T6-T7 CT head 12/1 > slight decrease in small volume SAH.  Evolving multifocal scalp contusions.  Micro Data:  Blood 11/29 >> Urine 11/29 >>  Antimicrobials:  None.  Interim history/subjective:  Remains agitated and required frequent ativan dosing overnight.  Objective   Blood pressure 140/87, pulse 79, temperature 98.1 F (36.7 C), temperature source Axillary, resp. rate 15, height 5\' 11"  (1.803 m), weight 91.6 kg, SpO2 99 %.        Intake/Output Summary (Last 24 hours) at 09/13/2018 0856 Last data filed at 09/13/2018 0700 Gross per 24 hour  Intake 3172.7 ml  Output 2600 ml  Net 572.7 ml   Filed Weights   09/10/18 0444 09/11/18 0522 09/12/18 0530  Weight: 94.4 kg 91.4 kg 91.6 kg    Examination: General: Adult male, in NAD.  In bilateral wrist and posey belt restraints. Neuro: More responsive today vs yesterday; though not able to follow all commands. HEENT: [place]/AT. Sclerae anicteric.  MM very dry. Cardiovascular: RRR, no M/R/G.  Lungs: Respirations even and unlabored.  CTA bilaterally, No W/R/R. Abdomen: BS x 4, soft, NT/ND.  Musculoskeletal:  No gross deformities, no edema.  Skin: Intact, warm, no rashes.   Assessment & Plan:   Seizures due to EtOH withdrawal. Delirium tremens. - Continue precedex (ceiling increased from 1.2 to 1.5), ativan PRN. - Add seroquel, will start with QHS dosing for tonight but might need BID dosing. - Caution with oversedation to avoid hemodynamic and respiratory compromise. - Continue empiric keppra. - Continue thiamine / folate.  Hypokalemia and hypomagnesemia related to alcohol abuse. - 4 runs K and 2g mag now. - Follow BMP.  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 - Monitor for bleeding. - Monitor platelet counts.  Small temporal ICH and SA. - Monitor clinically, no role surgical interventions. - Continue empiric keppra.  Parasthesias. 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  Hypovolemia. - 1L NS bolus now. - Increase MIVF to 125/hr.   Best practice:  Diet: clear liquids DVT prophylaxis: SCDs only GI prophylaxis: pepcid Mobility: bed rest Code Status: Full Family Communication: Father and mother updated at bedside 12/2.  None available 12/3.   Rutherford Guys, [place] [facility] Pager: [phone]  or [phone] 09/13/2018, 8:56 AM

## 2018-09-14 LAB — CULTURE, BLOOD (ROUTINE X 2)
CULTURE: NO GROWTH
Culture: NO GROWTH

## 2018-09-14 LAB — BASIC METABOLIC PANEL
Anion gap: 11 (ref 5–15)
BUN: <5 mg/dL — ABNORMAL LOW (ref 6–20)
CHLORIDE: 107 mmol/L (ref 98–111)
CO2: 22 mmol/L (ref 22–32)
Calcium: 8.7 mg/dL — ABNORMAL LOW (ref 8.9–10.3)
Creatinine, Ser: 0.71 mg/dL (ref 0.61–1.24)
GFR calc Af Amer: >60 mL/min (ref >60)
GFR calc non Af Amer: >60 mL/min (ref >60)
Glucose, Bld: 104 mg/dL — ABNORMAL HIGH (ref 70–99)
Potassium: 3.5 mmol/L (ref 3.5–5.1)
Sodium: 140 mmol/L (ref 135–145)

## 2018-09-14 LAB — HEMOGLOBIN A1C
Hgb A1c MFr Bld: 4.1 % — ABNORMAL LOW (ref 4.8–5.6)
Mean Plasma Glucose: 70.97 mg/dL

## 2018-09-14 LAB — CBC
HCT: 33 % — ABNORMAL LOW (ref 39.0–52.0)
Hemoglobin: 11.4 g/dL — ABNORMAL LOW (ref 13.0–17.0)
MCH: 34.5 pg — AB (ref 26.0–34.0)
MCHC: 34.5 g/dL (ref 30.0–36.0)
MCV: 100 fL (ref 80.0–100.0)
Platelets: 141 10*3/uL — ABNORMAL LOW (ref 150–400)
RBC: 3.3 MIL/uL — ABNORMAL LOW (ref 4.22–5.81)
RDW: 12.9 % (ref 11.5–15.5)
WBC: 3.8 10*3/uL — ABNORMAL LOW (ref 4.0–10.5)
nRBC: 0 % (ref 0.0–0.2)

## 2018-09-14 LAB — MAGNESIUM: Magnesium: 1.6 mg/dL — ABNORMAL LOW (ref 1.7–2.4)

## 2018-09-14 LAB — PHOSPHORUS: Phosphorus: 2.5 mg/dL (ref 2.5–4.6)

## 2018-09-14 MED ORDER — ADULT MULTIVITAMIN W/MINERALS CH
1.0000 | ORAL_TABLET | Freq: Every day | ORAL | Status: DC
  Administered 2018-09-14 – 2018-09-21 (×8): 1 via ORAL
  Filled 2018-09-14 (×9): qty 1

## 2018-09-14 MED ORDER — LORAZEPAM 2 MG/ML IJ SOLN
2.0000 mg | INTRAMUSCULAR | Status: DC | PRN
  Administered 2018-09-14 – 2018-09-15 (×5): 2 mg via INTRAVENOUS
  Filled 2018-09-14 (×6): qty 1

## 2018-09-14 MED ORDER — LORAZEPAM 2 MG/ML IJ SOLN: 1.0000 mg | INTRAMUSCULAR | Status: DC | PRN

## 2018-09-14 MED ORDER — MAGNESIUM SULFATE 2 GM/50ML IV SOLN
2.0000 g | Freq: Once | INTRAVENOUS | Status: AC
  Administered 2018-09-14: 2 g via INTRAVENOUS
  Filled 2018-09-14: qty 50

## 2018-09-14 MED ORDER — CLONIDINE HCL 0.1 MG PO TABS
0.3000 mg | ORAL_TABLET | Freq: Three times a day (TID) | ORAL | Status: DC
  Administered 2018-09-14 – 2018-09-18 (×13): 0.3 mg via ORAL
  Filled 2018-09-14 (×13): qty 3

## 2018-09-14 MED ORDER — ENSURE ENLIVE PO LIQD
237.0000 mL | Freq: Two times a day (BID) | ORAL | Status: DC
  Administered 2018-09-14 – 2018-09-21 (×13): 237 mL via ORAL

## 2018-09-14 NOTE — Progress Notes (Signed)
Initial Nutrition Assessment  DOCUMENTATION CODES:   Not applicable  INTERVENTION:    Ensure Enlive po TID, each supplement provides 350 kcal and 20 grams of protein  Provide MVI daily  Recommend checking Thiamine, Copper, and Vitamin C levels as pt has history of ETOH abuse and smoking.   NUTRITION DIAGNOSIS:   Inadequate oral intake related to lethargy/confusion as evidenced by meal completion < 25%.  GOAL:   Patient will meet greater than or equal to 90% of their needs   MONITOR:   Supplement acceptance, PO intake, Weight trends, Labs, I & O's  REASON FOR ASSESSMENT:   Diagnosis    ASSESSMENT:   Patient with PMH significant for ETOH abuse. Presents this admission with seizures due to ETOH withdrawal, small temporal ICH/SA, and paraesthesias.    Pt agitated upon assessment but able to answer some RD questions. Parents at bedside helped with history. Pt states his appetite was great PTA (ate three meals daily). Parents report pt was laid off one year ago and noticed a significant decrease in PO intake. He typically eats oatmeal for breakfast and [place] or pasta for dinner. Pt admits to drinking 2-4 [employer] beers (12 oz) during the weekday and 8-12 Saturday/Sunday. Parents claim that when they traveled to pt's house for Thanksgiving they noticed multiple 1L bottles of liquor in the trash can (unsure of that was recent intake or he was cleaning up). Parents suspect he was trying to detox before they got there as he had detox drinks in the refrigerator (unable to specify what they were called). 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 Pt continues to smoke cigarettes as well.  Suspect pt could have thiamine, copper, or Vitamin C deficiency given ETOH history and weakness. Would recommend checking blood levels.  Pt's diet advanced to regular this afternoon. Discussed the importance of protein  intake for preservation of lean body mass. Pt willing to try Ensure.   Pt endorses a UBW of 220 lb and a recent wt loss of 40 lb over the last 6 months. Records are limited in wt history for the year but shows pt weighed 242 lb in 2016 and 202 lb this admission. Nutrition-Focused physical exam completed. Suspect with intake pt is malnourished but unable to diagnosis with intake and leg depletions alone as parents report pt doesn't walk much.   Medications reviewed and include: 1 mg folic acid, 100 mg thiamine, precedex, LR with 20 mEq KCL @ 125 ml/hr Labs reviewed: Mg 1.6 (L)   NUTRITION - FOCUSED PHYSICAL EXAM:    Most Recent Value  Orbital Region  No depletion  Upper Arm Region  Unable to assess [restraints/agitated]  Thoracic and Lumbar Region  Unable to assess  Buccal Region  No depletion  Temple Region  No depletion  Clavicle Bone Region  No depletion  Clavicle and Acromion Bone Region  Unable to assess  Scapular Bone Region  Unable to assess  Dorsal Hand  No depletion  Patellar Region  Mild depletion  Anterior Thigh Region  Mild depletion  Posterior Calf Region  Mild depletion  Edema (RD Assessment)  Mild  Hair  Reviewed  Eyes  Reviewed  Mouth  Reviewed  Skin  Reviewed  Nails  Reviewed     Diet Order:   Diet Order            Diet regular Room service appropriate? Yes; Fluid consistency: Thin  Diet  effective now              EDUCATION NEEDS:   Education needs have been addressed  Skin:  Skin Assessment: Reviewed RN Assessment  Last BM:  09/14/18  Height:   Ht Readings from Last 1 Encounters:  09/12/18 5\' 11"  (1.803 m)    Weight:   Wt Readings from Last 1 Encounters:  09/14/18 91.7 kg    Ideal Body Weight:  78.2 kg  BMI:  Body mass index is 28.2 kg/m.  Estimated Nutritional Needs:   Kcal:  2300-2500 kcal   Protein:  115-130 grams  Fluid:  >/= 2.3 L/day   Vanessa Kick RD, LDN Clinical Nutrition Pager # - [phone]

## 2018-09-14 NOTE — Progress Notes (Addendum)
NEUROLOGY PROGRESS NOTE  Subjective: Patient is doing better today.  He is extubated.  He is still slightly confused and dysarthric with his speech.  Continues to have decreased sensation in his lower extremities.  He states that the numbness in his lower extremities has occurred for greater than 1 month.  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  Exam: Vitals:   09/14/18 0700 09/14/18 0800  BP: (S) (!) 180/109 (!) 169/113  Pulse: 78   Resp: 17 18  Temp:  (!) 97.5 F (36.4 C)  SpO2: 99% 97%    Physical Exam   HEENT-  Normocephalic, no lesions, without obvious abnormality.  Normal external eye and conjunctiva.   Extremities- Warm, dry and intact Musculoskeletal-no joint tenderness, deformity or swelling Skin-warm and dry, no hyperpigmentation, vitiligo, or suspicious lesions, some bruises  Neuro:  Mental Status: Alert but drowsy, slow thought process, he believes it is January, does not know that he is in the hospital,.  Speech dysarthric without evidence of aphasia.  Able to follow simple one-step commands without difficulty. Cranial Nerves: II:  Visual fields grossly normal,  III,IV, VI: ptosis not present, extra-ocular motions intact bilaterally pupils equal, round, reactive to light and accommodation V,VII: Face symmetric, facial light touch sensation normal bilaterally VIII: hearing normal bilaterally  Motor: Moving all extremities antigravity with good strength Sensory: Pinprick and light touch intact throughout upper extremity bilaterally.  Patient has decreased sensation to light touch in a stocking distribution.  He also has decreased sensation to vibration in a stocking distribution. Deep Tendon Reflexes: 2+ and symmetric throughout   Medications:  Scheduled: . chlorhexidine  15 mL Mouth Rinse BID  . cloNIDine  0.3 mg Oral TID  . folic acid  1 mg Intravenous Daily  . mouth rinse  15 mL Mouth Rinse q12n4p  . nicotine  21 mg  Transdermal Daily  . QUEtiapine  50 mg Oral QHS  . thiamine injection  100 mg Intravenous Daily   Continuous: . sodium chloride 10 mL/hr at 09/14/18 0824  . dexmedetomidine (PRECEDEX) IV infusion 1 mcg/kg/hr (09/14/18 8250)  . lactated ringers with kcl 125 mL/hr at 09/14/18 0700  . levETIRAcetam Stopped (09/13/18 2153)  . magnesium sulfate 1 - 4 g bolus IVPB 2 g (09/14/18 0827)   IBB:CWUGQB chloride, acetaminophen, acetaminophen, fentaNYL (SUBLIMAZE) injection, hydrALAZINE, labetalol, LORazepam  Pertinent Labs/Diagnostics: TSH on 09/09/2018 was obtained and normal at 3.228 B12 was obtained on 09/09/2018 and was slightly low at 426 A1c pending  Felicie Morn PA-C [facility] [phone]   Assessment: At this point patient is extubated and able to give some information.  He is still not oriented to place.  His lower extremity paresthesias and decreased sensation is most likely secondary to alcoholism.  That said, he still needs a further work-up with an EMG and nerve conduction study as an outpatient with Dr. Allena Katz.  Impression:  -Stocking distribution peripheral neuropathy - Withdraws from alcohol - Possible thiamine deficiency however he was given thiamine upon arrival and obtaining thiamine level at this time would not be helpful -Traumatic SAH -Seizure ?alcohol related vs secondary to traumatic SAH -Status epilepticus - resolved   Recommendations: -Obtain A1c - If A1c is within normal limits, given that TSH and B12 are normal would continue with thiamine 100 mg daily, and as noted follow-up with Dr. Allena Katz as an outpatient for EMG nerve conduction study-I have made ambulatory referral to Dr. Allena Katz in discharge orders - Continue  Keppra 500 mg twice daily on discharge -Seizure precuations  09/14/2018, 8:41 AM  Attending Neurohospitalist Addendum Patient seen and examined with APP/Resident. Agree with the history and physical as documented above. Agree with the  plan as documented, which I helped formulate. I have independently reviewed the chart, obtained history, review of systems and examined the patient.I have personally reviewed pertinent head/neck/spine imaging (CT/MRI). Please feel free to call with any questions. --- Milon Dikes, MD [facility] Pager: [phone]  If 7pm to 7am, please call on call as listed on AMION.

## 2018-09-14 NOTE — Progress Notes (Signed)
NAME:  Darren Diaz, MRN:  [id number], DOB:  May 25, 1986, LOS: 6 ADMISSION DATE:  09/08/2018, CONSULTATION DATE:  09/08/2018 REFERRING MD:  Dr. Manus Gunning, CHIEF COMPLAINT:  Seizure/ AMS  Brief History   32 year old male with ETOH hx with witnessed seizure and fall at home.  First seizure.  Seized again in ER and intubated for airway protection.  CTH noted for small IPH.  Past Medical History  PDA closure at age 55  Significant Hospital Events   11/28 Admitted 11/29 fever  Consults:  Neurology Neurosurgery s/o  Procedures:  11/28 ETT >> 11/29  Significant Diagnostic Tests:  11/28 CTH >> small volume ICH in Rt temporal lobe, possible small adjacent SAH, Rt scalp hematoma 11/28 EEG >> sedated state. MRI brain, c spine, t spine 11/28 >> subarachnoid blood in Rt temporal lobe, volume loss, Rt scalp hematoma, small protrusion T6-T7 CT head 12/1 > slight decrease in small volume SAH.  Evolving multifocal scalp contusions.  Micro Data:  Blood 11/29 >> Urine 11/29 >>  Antimicrobials:  None.  Interim history/subjective:  Agitated this AM but was able to be calmed by myself and RN's. Pt confused as to why he is in the hospital.  Objective   Blood pressure (!) 169/113, pulse 78, temperature (!) 97.5 F (36.4 C), temperature source Axillary, resp. rate 18, height 5\' 11"  (1.803 m), weight 91.7 kg, SpO2 97 %.        Intake/Output Summary (Last 24 hours) at 09/14/2018 0812 Last data filed at 09/14/2018 0700 Gross per 24 hour  Intake 6600.3 ml  Output 4175 ml  Net 2425.3 ml   Filed Weights   09/11/18 0522 09/12/18 0530 09/14/18 0300  Weight: 91.4 kg 91.6 kg 91.7 kg    Examination: General: Adult male, in NAD.  Agitated but able to be re-directed and calmed.  In bilateral wrist and posey belt restraints. Neuro: Awake, able to follow basic commands.  MAE's. HEENT: [place]/AT. Sclerae anicteric.  MM dry. Cardiovascular: RRR, no M/R/G.  Lungs: Respirations even and unlabored.  CTA  bilaterally, No W/R/R. Abdomen: BS x 4, soft, NT/ND.  Musculoskeletal: No gross deformities, no edema.  Skin: Intact, warm, no rashes.   Assessment & Plan:   Seizures due to EtOH withdrawal. Delirium tremens. - Continue precedex (ceiling increased from 1.2 to 1.5), ativan PRN (CIWA initiated 12/4). - Added seroquel 12/3 with once daily QHS dosing.  Would like to increase to BID dosing now; however, QTc is prolonged at 484; therefore, will hold off on extra dosing as risk of worsening QTc is high given that he is already on precedex. - Assess EKG daily, once QTc has improved then would start BID dosing of seroquel. - Will add clonidine at 0.3mg  TID in hopes we can wean him off of precedex. - Caution with oversedation to avoid hemodynamic and respiratory compromise. - Continue empiric keppra. - Continue thiamine / folate.  Hypokalemia (resolved) and hypomagnesemia due to EtOH use. - 2g Mag now. - Maintain K > 4 and Mg > 2 in light of prolonged QTC. - Follow BMP.  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 - Monitor for bleeding. - Monitor platelet counts.  Small temporal ICH and SA. - Monitor clinically, no role surgical interventions. - Continue empiric keppra.  Parasthesias. 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   Best practice:  Diet: Regular. DVT prophylaxis: SCDs only GI prophylaxis: D/c pepcid given prolonged QTc. Mobility: bed rest Code Status: Full Family Communication:  Father and mother updated at bedside 12/2.  None available 12/3 or 12/4.   Rutherford [place], [place] [facility] Pager: [phone]  or [phone] 09/14/2018, 8:12 AM

## 2018-09-15 LAB — CBC
HEMATOCRIT: 38.4 % — AB (ref 39.0–52.0)
Hemoglobin: 12.8 g/dL — ABNORMAL LOW (ref 13.0–17.0)
MCH: 33.5 pg (ref 26.0–34.0)
MCHC: 33.3 g/dL (ref 30.0–36.0)
MCV: 100.5 fL — ABNORMAL HIGH (ref 80.0–100.0)
Platelets: 211 10*3/uL (ref 150–400)
RBC: 3.82 MIL/uL — ABNORMAL LOW (ref 4.22–5.81)
RDW: 12.9 % (ref 11.5–15.5)
WBC: 4.2 10*3/uL (ref 4.0–10.5)
nRBC: 0 % (ref 0.0–0.2)

## 2018-09-15 LAB — BASIC METABOLIC PANEL
Anion gap: 13 (ref 5–15)
BUN: <5 mg/dL — ABNORMAL LOW (ref 6–20)
CO2: 22 mmol/L (ref 22–32)
Calcium: 9.1 mg/dL (ref 8.9–10.3)
Chloride: 105 mmol/L (ref 98–111)
Creatinine, Ser: 0.7 mg/dL (ref 0.61–1.24)
GFR calc Af Amer: >60 mL/min (ref >60)
GFR calc non Af Amer: >60 mL/min (ref >60)
Glucose, Bld: 100 mg/dL — ABNORMAL HIGH (ref 70–99)
Potassium: 3.1 mmol/L — ABNORMAL LOW (ref 3.5–5.1)
Sodium: 140 mmol/L (ref 135–145)

## 2018-09-15 LAB — MAGNESIUM: Magnesium: 1.9 mg/dL (ref 1.7–2.4)

## 2018-09-15 LAB — PHOSPHORUS: Phosphorus: 4.2 mg/dL (ref 2.5–4.6)

## 2018-09-15 MED ORDER — LEVETIRACETAM 500 MG PO TABS
500.0000 mg | ORAL_TABLET | Freq: Two times a day (BID) | ORAL | Status: DC
  Administered 2018-09-15 – 2018-09-18 (×7): 500 mg via ORAL
  Filled 2018-09-15 (×7): qty 1

## 2018-09-15 MED ORDER — POTASSIUM CHLORIDE CRYS ER 20 MEQ PO TBCR
40.0000 meq | EXTENDED_RELEASE_TABLET | Freq: Once | ORAL | Status: AC
  Administered 2018-09-15: 40 meq via ORAL
  Filled 2018-09-15: qty 2

## 2018-09-15 MED ORDER — FOLIC ACID 1 MG PO TABS
1.0000 mg | ORAL_TABLET | Freq: Every day | ORAL | Status: DC
  Administered 2018-09-15 – 2018-09-21 (×7): 1 mg via ORAL
  Filled 2018-09-15 (×9): qty 1

## 2018-09-15 MED ORDER — VITAMIN B-1 100 MG PO TABS
100.0000 mg | ORAL_TABLET | Freq: Every day | ORAL | Status: DC
  Administered 2018-09-15 – 2018-09-21 (×7): 100 mg via ORAL
  Filled 2018-09-15 (×8): qty 1

## 2018-09-15 NOTE — Progress Notes (Addendum)
   NAME:  Darren Diaz, MRN:  [id number], DOB:  1986-07-18, LOS: 7 ADMISSION DATE:  09/08/2018, CONSULTATION DATE:  09/08/2018 REFERRING MD:  Dr. Manus Gunning, CHIEF COMPLAINT:  Seizure/ AMS  Brief History   32 year old male with ETOH hx with witnessed seizure and fall at home.  First seizure.  Seized again in ER and intubated for airway protection.  CTH noted for small IPH.  Past Medical History  PDA closure at age 35  Significant Hospital Events   11/28 Admitted 11/29 fever  Consults:  Neurology Neurosurgery s/o  Procedures:  11/28 ETT >> 11/29  Significant Diagnostic Tests:  11/28 CTH >> small volume ICH in Rt temporal lobe, possible small adjacent SAH, Rt scalp hematoma 11/28 EEG >> sedated state. MRI brain, c spine, t spine 11/28 >> subarachnoid blood in Rt temporal lobe, volume loss, Rt scalp hematoma, small protrusion T6-T7 CT head 12/1 > slight decrease in small volume SAH.  Evolving multifocal scalp contusions.  Micro Data:  Blood 11/29 >> negative Urine 11/29 >> not done  Antimicrobials:  None.  Interim history/subjective:  Confusion and agitation.  Objective   Blood pressure (!) 166/105, pulse (!) 104, temperature 98 F (36.7 C), temperature source Oral, resp. rate (!) 22, height 5\' 11"  (1.803 m), weight 91.7 kg, SpO2 100 %.        Intake/Output Summary (Last 24 hours) at 09/15/2018 0935 Last data filed at 09/15/2018 0800 Gross per 24 hour  Intake 4038.34 ml  Output 5650 ml  Net -1611.66 ml   Filed Weights   09/11/18 0522 09/12/18 0530 09/14/18 0300  Weight: 91.4 kg 91.6 kg 91.7 kg    Examination: General: Well-nourished well-developed male no acute distress at rest HEENT: Poor oral hygiene.  Pupils equal reactive light Neuro: Remains confused as to time and place.  Is all extremities. CV: s1s2 rrr, no m/r/g PULM: even/non-labored, lungs bilaterally managed in bases BX:UXYB, non-tender, bsx4 active  Extremities: warm/dry, negative edema  Skin: no  rashes or lesions    Assessment & Plan:   Seizures due to EtOH withdrawal. Delirium tremens. -  continue Precedex, Ativan, Seroquel.  Goal is to get him off Precedex -Monitor QTC while on Seroquel -Clonidine hopefully be able to wean off Precedex -Avoid oversedation -Continue Keppra changed to p.o. 09/15/2018 -Social worker consult for outpatient counseling -When more awake and cognitive psych consult for possible alcohol treatment  Hypokalemia (repleting 09/15/2018) and hypomagnesemia within normal limits 09/15/2018 due to EtOH use. -Replete potassium -Electrolyte Thrombocytopenia due to alcohol related marrow suppression -resolved as of 09/15/2018 -Monitor for bleeding issues   Small temporal ICH and SA. - monitor -empirical Keppra  Parasthesias. 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 DVT prophylaxis: SCDs only GI prophylaxis: D/c pepcid given prolonged QTc. Mobility: bed rest Code Status: Full Family Communication: Father mother at bedside 09/15/2018   [facility] Shakia Sebastiano ACNP [facility] Pager [phone] till 1 pm If no answer page [phone] 09/15/2018, 9:35 AM

## 2018-09-15 NOTE — Progress Notes (Signed)
Spoke with patient's father today; confirmed that he and his wife did receive list of area ETOH rehab facilities.  He states they did speak with CSW, and understand that CSW will follow up with them when pt not sedated and able to make decisions for himself.  He verbalizes understanding of this, and states he will pass this information on to his wife.  Ultimately, it will be patient's decision to enter treatment facility.    Quintella Baton, RN, BSN  Trauma/Neuro ICU Case Manager [phone]

## 2018-09-15 NOTE — Care Management Note (Signed)
Case Management Note  Patient Details  Name: Darren Diaz MRN: [id number] Date of Birth: [birth year]  Subjective/Objective:  32 year old male with hx of alcohol abuse presented with witnessed seizure and fall and found with small ICH.  PTA, pt independent, lives at home alone.                    Action/Plan: Pt remains in ICU for agitation due to DTs requiring Precedex drip.  His parents are here from [place] visiting for the holidays.  They want pt to go to alcohol rehab.  CSW following to assist with resources for possible rehab, but pt currently remains sedated and unable to make decisions for himself.  Parents are aware that decision to enter rehab will have to be his own, and we cannot make him go.    Expected Discharge Date:                  Expected Discharge Plan:     In-House Referral:  Clinical Social Work  Discharge planning Services  CM Consult  Post Acute Care Choice:    Choice offered to:     DME Arranged:    DME Agency:     HH Arranged:    HH Agency:     Status of Service:  In process, will continue to follow  If discussed at Long Length of Stay Meetings, dates discussed:    Additional Comments:  Quintella Baton, RN, BSN  Trauma/Neuro ICU Case Manager [phone]

## 2018-09-16 LAB — BASIC METABOLIC PANEL
Anion gap: 9 (ref 5–15)
BUN: 7 mg/dL (ref 6–20)
CO2: 24 mmol/L (ref 22–32)
Calcium: 9 mg/dL (ref 8.9–10.3)
Chloride: 105 mmol/L (ref 98–111)
Creatinine, Ser: 0.87 mg/dL (ref 0.61–1.24)
GFR calc Af Amer: >60 mL/min (ref >60)
GFR calc non Af Amer: >60 mL/min (ref >60)
Glucose, Bld: 94 mg/dL (ref 70–99)
Potassium: 3.5 mmol/L (ref 3.5–5.1)
SODIUM: 138 mmol/L (ref 135–145)

## 2018-09-16 LAB — PHOSPHORUS: Phosphorus: 4.5 mg/dL (ref 2.5–4.6)

## 2018-09-16 LAB — MAGNESIUM: Magnesium: 1.7 mg/dL (ref 1.7–2.4)

## 2018-09-16 NOTE — Progress Notes (Addendum)
   NAME:  Darren Diaz, MRN:  [id number], DOB:  1986-08-12, LOS: 8 ADMISSION DATE:  09/08/2018, CONSULTATION DATE:  09/08/2018 REFERRING MD:  Dr. Manus Gunning, CHIEF COMPLAINT:  Seizure/ AMS  Brief History   32 year old male with ETOH hx with witnessed seizure and fall at home.  First seizure.  Seized again in ER and intubated for airway protection.  CTH noted for small IPH.  Past Medical History  PDA closure at age 11  Significant Hospital Events   11/28 Admitted 11/29 fever 09/16/2017 transfer to floor and to [facility]  Consults:  Neurology Neurosurgery s/o  Procedures:  11/28 ETT >> 11/29  Significant Diagnostic Tests:  11/28 CTH >> small volume ICH in Rt temporal lobe, possible small adjacent SAH, Rt scalp hematoma 11/28 EEG >> sedated state. MRI brain, c spine, t spine 11/28 >> subarachnoid blood in Rt temporal lobe, volume loss, Rt scalp hematoma, small protrusion T6-T7 CT head 12/1 > slight decrease in small volume SAH.  Evolving multifocal scalp contusions.  Micro Data:  Blood 11/29 >> negative Urine 11/29 >> not done  Antimicrobials:  None.  Interim history/subjective:  More awake and interactive less confused, off Precedex  Objective   Blood pressure (!) 147/86, pulse 95, temperature 98.1 F (36.7 C), temperature source Oral, resp. rate 14, height 5\' 11"  (1.803 m), weight 91.7 kg, SpO2 99 %.        Intake/Output Summary (Last 24 hours) at 09/16/2018 0956 Last data filed at 09/16/2018 0900 Gross per 24 hour  Intake 1239.17 ml  Output 501 ml  Net 738.17 ml   Filed Weights   09/11/18 0522 09/12/18 0530 09/14/18 0300  Weight: 91.4 kg 91.6 kg 91.7 kg    Examination: General: Well-nourished well-developed male in no acute distress HEENT: No JVD or lymphadenopathy is appreciated Neuro: Awake, alert, follows commands.  Still confused as to place and time but is less agitated.  And is off Precedex CV: s1s2 rrr, no m/r/g PULM: even/non-labored, lungs  bilaterally decreased in bases GD:JMEQ, non-tender, bsx4 active  Extremities: warm/dry, negative edema  Skin: no rashes or lesions    Assessment & Plan:   Seizures due to EtOH withdrawal. Delirium tremens. -Currently off Precedex will not restart -Continue Ativan and Seroquel and Keppra -Monitor QTC while on Seroquel -Continue clonidine -Avoid oversedation -Continue Keppra changed to p.o. 09/15/2018 and she will be continued on discharge. -Consult has been called questionable alcoholic rehab.  He will need to enter himself in rehab at this time if needed. -Transfer to the floor to [facility] service as of 09/17/2018  Hypokalemia (repleting 09/15/2018) and hypomagnesemia within normal limits 09/15/2018 due to EtOH use. 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 -Continue to monitor -continue empirical Keppra  Parasthesias. -We will need neurology follow-up as an outpatient    Best practice:  Diet: Regular. DVT prophylaxis: SCDs only GI prophylaxis: D/c pepcid given prolonged QTc. Mobility: Out of bed to chair Code Status: Full Family Communication: 09/16/2018 no family at bedside 09/16/2018 transferred to telemetry floor and to [facility] service Dr. Sharon Seller aware.   Brett Canales Minor ACNP [facility] Pager [phone]  If no answer page [phone] 09/16/2018, 9:56 AM

## 2018-09-17 ENCOUNTER — Other Ambulatory Visit: Payer: Self-pay

## 2018-09-17 DIAGNOSIS — G609 Hereditary and idiopathic neuropathy, unspecified: Secondary | ICD-10-CM

## 2018-09-17 LAB — BASIC METABOLIC PANEL
Anion gap: 11 (ref 5–15)
BUN: 8 mg/dL (ref 6–20)
CO2: 25 mmol/L (ref 22–32)
Calcium: 9.4 mg/dL (ref 8.9–10.3)
Chloride: 103 mmol/L (ref 98–111)
Creatinine, Ser: 1.12 mg/dL (ref 0.61–1.24)
GFR calc Af Amer: >60 mL/min (ref >60)
GFR calc non Af Amer: >60 mL/min (ref >60)
Glucose, Bld: 90 mg/dL (ref 70–99)
Potassium: 3.5 mmol/L (ref 3.5–5.1)
Sodium: 139 mmol/L (ref 135–145)

## 2018-09-17 LAB — CBC WITH DIFFERENTIAL/PLATELET
Abs Immature Granulocytes: 0.13 10*3/uL — ABNORMAL HIGH (ref 0.00–0.07)
BASOS PCT: 1 %
Basophils Absolute: 0.1 10*3/uL (ref 0.0–0.1)
Eosinophils Absolute: 0.1 10*3/uL (ref 0.0–0.5)
Eosinophils Relative: 1 %
HCT: 34.7 % — ABNORMAL LOW (ref 39.0–52.0)
Hemoglobin: 11.9 g/dL — ABNORMAL LOW (ref 13.0–17.0)
Immature Granulocytes: 2 %
Lymphocytes Relative: 31 %
Lymphs Abs: 1.9 10*3/uL (ref 0.7–4.0)
MCH: 34.5 pg — ABNORMAL HIGH (ref 26.0–34.0)
MCHC: 34.3 g/dL (ref 30.0–36.0)
MCV: 100.6 fL — ABNORMAL HIGH (ref 80.0–100.0)
Monocytes Absolute: 0.8 10*3/uL (ref 0.1–1.0)
Monocytes Relative: 13 %
NEUTROS ABS: 3.2 10*3/uL (ref 1.7–7.7)
Neutrophils Relative %: 52 %
Platelets: 288 10*3/uL (ref 150–400)
RBC: 3.45 MIL/uL — ABNORMAL LOW (ref 4.22–5.81)
RDW: 12.5 % (ref 11.5–15.5)
WBC: 6.2 10*3/uL (ref 4.0–10.5)
nRBC: 0 % (ref 0.0–0.2)

## 2018-09-17 LAB — MAGNESIUM: Magnesium: 1.7 mg/dL (ref 1.7–2.4)

## 2018-09-17 LAB — PHOSPHORUS: Phosphorus: 5.6 mg/dL — ABNORMAL HIGH (ref 2.5–4.6)

## 2018-09-17 MED ORDER — LORAZEPAM 2 MG/ML IJ SOLN
1.0000 mg | Freq: Four times a day (QID) | INTRAMUSCULAR | Status: DC | PRN
  Administered 2018-09-17: 2 mg via INTRAVENOUS
  Filled 2018-09-17: qty 1

## 2018-09-17 MED ORDER — LORAZEPAM 2 MG/ML IJ SOLN
1.0000 mg | Freq: Three times a day (TID) | INTRAMUSCULAR | Status: DC
  Administered 2018-09-18 – 2018-09-19 (×4): 1 mg via INTRAVENOUS
  Filled 2018-09-17 (×5): qty 1

## 2018-09-17 MED ORDER — LEVETIRACETAM IN NACL 1000 MG/100ML IV SOLN: 1000.0000 mg | Freq: Once | INTRAVENOUS | Status: DC

## 2018-09-17 MED ORDER — LORAZEPAM 2 MG/ML IJ SOLN: 1.0000 mg | Freq: Once | INTRAMUSCULAR | Status: DC

## 2018-09-17 MED ORDER — POTASSIUM CHLORIDE CRYS ER 20 MEQ PO TBCR
40.0000 meq | EXTENDED_RELEASE_TABLET | Freq: Once | ORAL | Status: AC
  Administered 2018-09-17: 40 meq via ORAL
  Filled 2018-09-17: qty 2

## 2018-09-17 NOTE — Progress Notes (Signed)
PROGRESS NOTE    Darren Diaz  ZOX:[id number] DOB: Aug 23, 1986 DOA: 09/08/2018 PCP: Patient, No Pcp Per   Brief Narrative: 32 year old male with history of alcohol abuse presented with witnessed seizure and fall and found with small ICH.  Previously required Precedex for agitation secondary delirium tremens.  He has now been weaned off and transferred to [facility] service.    Assessment & Plan:   Active Problems:   Seizures (HCC)   Alcohol withdrawal/ Delirium tremens  Appears to have resolved.  Off precedex and continue with seroquel.  Will plan to titrate the clonidine off .  Continue with thiamine and folate.    Hypokalemia and hypomagnesemia: Replaced.    Lower extremity neuropathy;  - EMG nerve conduction study as outpatient.  - continue with thiamine.    Seizures sec to SAH/ alcohol abuse:  - continue with keppra.   Unsteadiness:  Will need PT evaluation.    Macrocytic anemia : - hemoglobin stable between 11 to 12.    Prolonged QT interval:  - replace potassium and keep it greater than 4 and magnesium greater than 2.  - repeat EKG in am.      DVT prophylaxis:  Code Status:  Full code.  Family Communication: mom at bedside.  Disposition Plan: .pending PT evaluation.    Consultants:   PCCM   Procedures: none.     Antimicrobials: None.    Subjective: Reports being unsteady on his feet.  Objective: Vitals:   09/17/18 0021 09/17/18 0426 09/17/18 0751 09/17/18 1144  BP:  (!) 159/83 (!) 153/96 (!) 142/92  Pulse:  75 (!) 56 67  Resp:  20 18 18   Temp: 99 F (37.2 C) 98.3 F (36.8 C) 98.9 F (37.2 C) 97.7 F (36.5 C)  TempSrc: Oral Oral Oral Oral  SpO2:  100% 98% 98%  Weight:      Height:        Intake/Output Summary (Last 24 hours) at 09/17/2018 1433 Last data filed at 09/17/2018 1011 Gross per 24 hour  Intake 837 ml  Output 900 ml  Net -63 ml   Filed Weights   09/12/18 0530 09/14/18 0300 09/16/18 2110  Weight: 91.6 kg 91.7 kg 89.6  kg    Examination:  General exam: Appears calm and comfortable , not in distress.  Respiratory system: Clear to auscultation. Respiratory effort normal. Cardiovascular system: S1 & S2 heard, RRR. No JVD, murmurs,  Gastrointestinal system: Abdomen is nondistended, soft and nontender. No organomegaly or masses felt. Normal bowel sounds heard. Central nervous system: Alert and oriented. No focal neurological deficits. Extremities: Symmetric 5 x 5 power. Skin: No rashes, lesions or ulcers Psychiatry:  Mood & affect appropriate.     Data Reviewed: I have personally reviewed following labs and imaging studies  CBC: Recent Labs  Lab 09/13/18 0417 09/14/18 0428 09/15/18 0315 09/17/18 0445  WBC 4.7 3.8* 4.2 6.2  NEUTROABS  --   --   --  3.2  HGB 12.0* 11.4* 12.8* 11.9*  HCT 34.9* 33.0* 38.4* 34.7*  MCV 100.6* 100.0 100.5* 100.6*  PLT 125* 141* 211 288   Basic Metabolic Panel: Recent Labs  Lab 09/13/18 0417 09/14/18 0428 09/15/18 0315 09/16/18 0400 09/17/18 0445  NA 140 140 140 138 139  K 3.1* 3.5 3.1* 3.5 3.5  CL 105 107 105 105 103  CO2 21* 22 22 24 25   GLUCOSE 103* 104* 100* 94 90  BUN 5* <5* <5* 7 8  CREATININE 0.73 0.71 0.70 0.87 1.12  CALCIUM  8.4* 8.7* 9.1 9.0 9.4  MG 1.5* 1.6* 1.9 1.7 1.7  PHOS 3.8 2.5 4.2 4.5 5.6*   GFR: Estimated Creatinine Clearance: 99 mL/min (by C-G formula based on SCr of 1.12 mg/dL). Liver Function Tests: No results for input(s): AST, ALT, ALKPHOS, BILITOT, PROT, ALBUMIN in the last 168 hours. No results for input(s): LIPASE, AMYLASE in the last 168 hours. No results for input(s): AMMONIA in the last 168 hours. Coagulation Profile: No results for input(s): INR, PROTIME in the last 168 hours. Cardiac Enzymes: No results for input(s): CKTOTAL, CKMB, CKMBINDEX, TROPONINI in the last 168 hours. BNP (last 3 results) No results for input(s): PROBNP in the last 8760 hours. HbA1C: No results for input(s): HGBA1C in the last 72  hours. CBG: No results for input(s): GLUCAP in the last 168 hours. Lipid Profile: No results for input(s): CHOL, HDL, LDLCALC, TRIG, CHOLHDL, LDLDIRECT in the last 72 hours. Thyroid Function Tests: No results for input(s): TSH, T4TOTAL, FREET4, T3FREE, THYROIDAB in the last 72 hours. Anemia Panel: No results for input(s): VITAMINB12, FOLATE, FERRITIN, TIBC, IRON, RETICCTPCT in the last 72 hours. Sepsis Labs: No results for input(s): PROCALCITON, LATICACIDVEN in the last 168 hours.  Recent Results (from the past 240 hour(s))  MRSA PCR Screening     Status: None   Collection Time: 09/08/18 12:16 PM  Result Value Ref Range Status   MRSA by PCR NEGATIVE NEGATIVE Final    Comment:        The GeneXpert MRSA Assay (FDA approved for NASAL specimens only), is one component of a comprehensive MRSA colonization surveillance program. It is not intended to diagnose MRSA infection nor to guide or monitor treatment for MRSA infections. Performed at [facility], [address] [place], [place] [address]   Culture, blood (Routine X 2) w Reflex to ID Panel     Status: None   Collection Time: 09/09/18  3:41 AM  Result Value Ref Range Status   Specimen Description BLOOD LEFT HAND  Final   Special Requests   Final    BOTTLES DRAWN AEROBIC ONLY Blood Culture results may not be optimal due to an inadequate volume of blood received in culture bottles   Culture   Final    NO GROWTH 5 DAYS Performed at [facility], [address] [place], [place] [address]    Report Status 09/14/2018 FINAL  Final  Culture, blood (Routine X 2) w Reflex to ID Panel     Status: None   Collection Time: 09/09/18  3:47 AM  Result Value Ref Range Status   Specimen Description BLOOD LEFT HAND  Final   Special Requests   Final    BOTTLES DRAWN AEROBIC ONLY Blood Culture results may not be optimal due to an inadequate volume of blood received in culture bottles   Culture   Final    NO GROWTH 5  DAYS Performed at [facility], [address], [place], [place] [address]    Report Status 09/14/2018 FINAL  Final         Radiology Studies: No results found.      Scheduled Meds: . cloNIDine  0.3 mg Oral TID  . feeding supplement (ENSURE ENLIVE)  237 mL Oral BID BM  . folic acid  1 mg Oral Daily  . levETIRAcetam  500 mg Oral BID  . multivitamin with minerals  1 tablet Oral Daily  . QUEtiapine  50 mg Oral QHS  . thiamine  100 mg  Oral Daily   Continuous Infusions: . sodium chloride Stopped (09/14/18 2117)  . lactated ringers with kcl 10 mL/hr at 09/17/18 0001     LOS: 9 days    Time spent: 35 minutes.     Kathlen Mody, MD [facility] Pager [phone]  If 7PM-7AM, please contact night-coverage [email or web address] Password [facility] 09/17/2018, 2:33 PM

## 2018-09-17 NOTE — Progress Notes (Signed)
Pt. Had another grand mal tonic clonic seizure characterized by shaking, spitting, and eyes rolling backward x2 mins. VS stable. Med given per order. Dr. Wilford Corner aware. Orders updated. Family updated.

## 2018-09-17 NOTE — Progress Notes (Signed)
Pt had 2 min seizure episode characterized by incontinence, laying on right side at this time. Vital signs stable. Family at bedside. MD aware. Order received. Oncoming shift nurse updated. Will continue to monitor.

## 2018-09-18 ENCOUNTER — Inpatient Hospital Stay (HOSPITAL_COMMUNITY): Payer: Self-pay

## 2018-09-18 DIAGNOSIS — G40919 Epilepsy, unspecified, intractable, without status epilepticus: Secondary | ICD-10-CM

## 2018-09-18 DIAGNOSIS — S065X9A Traumatic subdural hemorrhage with loss of consciousness of unspecified duration, initial encounter: Secondary | ICD-10-CM

## 2018-09-18 DIAGNOSIS — G629 Polyneuropathy, unspecified: Secondary | ICD-10-CM

## 2018-09-18 LAB — BASIC METABOLIC PANEL
Anion gap: 12 (ref 5–15)
BUN: 9 mg/dL (ref 6–20)
CO2: 20 mmol/L — ABNORMAL LOW (ref 22–32)
Calcium: 9.1 mg/dL (ref 8.9–10.3)
Chloride: 107 mmol/L (ref 98–111)
Creatinine, Ser: 0.91 mg/dL (ref 0.61–1.24)
GFR calc Af Amer: >60 mL/min (ref >60)
GFR calc non Af Amer: >60 mL/min (ref >60)
Glucose, Bld: 110 mg/dL — ABNORMAL HIGH (ref 70–99)
Potassium: 3.9 mmol/L (ref 3.5–5.1)
SODIUM: 139 mmol/L (ref 135–145)

## 2018-09-18 LAB — COPPER, SERUM: Copper: 117 ug/dL (ref 72–166)

## 2018-09-18 MED ORDER — GADOBUTROL 1 MMOL/ML IV SOLN
8.0000 mL | Freq: Once | INTRAVENOUS | Status: AC | PRN
  Administered 2018-09-18: 8 mL via INTRAVENOUS

## 2018-09-18 MED ORDER — LEVETIRACETAM 500 MG PO TABS
1000.0000 mg | ORAL_TABLET | Freq: Two times a day (BID) | ORAL | Status: DC
  Administered 2018-09-18 – 2018-09-21 (×6): 1000 mg via ORAL
  Filled 2018-09-18 (×6): qty 2

## 2018-09-18 MED ORDER — CLONIDINE HCL 0.1 MG PO TABS
0.2000 mg | ORAL_TABLET | Freq: Three times a day (TID) | ORAL | Status: AC
  Administered 2018-09-18 – 2018-09-20 (×7): 0.2 mg via ORAL
  Filled 2018-09-18 (×9): qty 2

## 2018-09-18 NOTE — Progress Notes (Addendum)
NEUROLOGY PROGRESS NOTE  Subjective: Neurology was called back to evaluate for break through seizures overnight. Pt has no recollection of this. RN reports that he had two brief seizures last evening. He is at baseline a&o, but he is a poor historian and poor insight to his situation. MRI was ordered by primary team, which is again neg.   ROS: 14 pt system review neg except for above   Exam: Vitals:   09/18/18 0741 09/18/18 1135  BP: 140/90 133/82  Pulse: 79 63  Resp: 16 16  Temp: 99 F (37.2 C) 98.6 F (37 C)  SpO2: 99% 100%    Physical Exam   HEENT-  Normocephalic, no lesions, without obvious abnormality.  Normal external eye and conjunctiva.   Extremities- Warm, dry and intact Musculoskeletal-no joint tenderness, deformity or swelling Skin-warm and dry, no hyperpigmentation, vitiligo, or suspicious lesions, some bruises  Neuro: Mental Status: Alert and oriented to all questions. However, he has poor insight and judgement and has a poor thought process. 0/3 MME. Can only name 1 out of 5 animals that start with letter D. He can't do a simple math problem (25-7). Speech is slightly dysarthric without evidence of aphasia.  Able to follow simple one-step commands without difficulty. Cranial Nerves: II:  Visual fields grossly normal,  III,IV, VI: ptosis not present, extra-ocular motions intact bilaterally pupils equal, round, reactive to light and accommodation V,VII: Face symmetric, facial light touch sensation normal bilaterally VIII: hearing normal bilaterally  Motor: Moving all extremities antigravity with good strength Sensory: Patient has decreased sensation to light touch in a stocking distribution.  He also has decreased sensation to vibration in a stocking distribution. Deep Tendon Reflexes: 2+ and symmetric throughout   Medications:  Scheduled: . cloNIDine  0.2 mg Oral TID  . feeding supplement (ENSURE ENLIVE)  237 mL Oral BID BM  . folic acid  1 mg Oral Daily   . levETIRAcetam  1,000 mg Oral BID  . LORazepam  1 mg Intravenous Q8H  . multivitamin with minerals  1 tablet Oral Daily  . QUEtiapine  50 mg Oral QHS  . thiamine  100 mg Oral Daily   Continuous: . sodium chloride Stopped (09/14/18 2117)  . lactated ringers with kcl 10 mL/hr at 09/17/18 0001   ERD:EYCXKG chloride, acetaminophen, acetaminophen, hydrALAZINE, labetalol, LORazepam  Impression/Plan:  32yr old man with extensive alcohol abuse who presented with seizure and fall. Small traumatic ICH. Previously was on Precedex for agitation and DTs. He is now out of ICU and on neuro floor. He had 2 breakthrough seizures overnight.   # Status Epilepticus- resolved. Now with some breakthrough seizures- Seizure etiology may be two fold given the extensive ETOH w/d and SDH. will increase keppra at this time. 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 Continue thiamine # Traumatic SAH- will need to stay of keppra at least till f/u as out pt. Repeat MRI shows complete resolution at this time and is neg for any further acute problems.    Recommendations: -Seizure precuations -Increase Keppra to 1000mg  BID PO -Continue ETOH w/d plan and thiamine -Will need out pt neuro f/u for seizures.  -No driving was discussed with pt -Rehab and out pt cognitive evaluation is recommended  Attending Neurohospitalist Addendum Patient seen and examined with APP/Resident. Agree with the history and physical as documented above. Agree with the plan as documented, which I helped formulate. I have independently reviewed the chart, obtained history, review of  systems and examined the patient.I have personally reviewed pertinent head/neck/spine imaging (CT/MRI). Please feel free to call with any questions. --- Milon Dikes, MD [facility] Pager: [phone]  If 7pm to 7am, please call on call as listed on AMION.

## 2018-09-18 NOTE — Progress Notes (Addendum)
PROGRESS NOTE    Darren Diaz  ZOX:[id number] DOB: 03-21-1986 DOA: 09/08/2018 PCP: Patient, No Pcp Per   Brief Narrative: 32 year old male with history of alcohol abuse presented with witnessed seizure and fall and found with small ICH.  Previously required Precedex for agitation secondary delirium tremens.  He has now been weaned off and transferred to [facility] service.    Assessment & Plan:   Active Problems:   Seizures (HCC)   Alcohol withdrawal/ Delirium tremens  Appears to have resolved.  Off precedex and continue with seroquel.  Will plan to titrate the clonidine off .  Continue with thiamine and folate.    Hypokalemia and hypomagnesemia: Replaced.    Lower extremity neuropathy;  -further work-up with an EMG and nerve conduction study as an outpatient with Dr. Allena Katz. - continue with thiamine.    Seizures sec to SAH/ alcohol abuse:  Patient had 2 generalized tonic-clonic seizures last night.  Patient currently is back to baseline at this time.  Neurology reconsulted for recommendations.  Currently patient is on Keppra 500 twice daily.  I of the brain with and without contrast ordered.  Unsteadiness:  Will need PT evaluation.    Macrocytic anemia : - hemoglobin stable between 11 to 12.    Prolonged QT interval:  - replace potassium and keep it greater than 4 and magnesium greater than 2.  Repeat EKG today     DVT prophylaxis: SCDs Code Status:  Full code.  Family Communication: Discussed the plan with mom at bedside Disposition Plan: .pending PT evaluation.    Consultants:   PCCM   neurology Dr. Wilford Corner  Procedures: none.     Antimicrobials: None.    Subjective: Patient is alert and afebrile and comfortable.  Objective: Vitals:   09/18/18 0016 09/18/18 0356 09/18/18 0741 09/18/18 1135  BP: 135/78 127/73 140/90 133/82  Pulse: 69 70 79 63  Resp: 18 19 16 16   Temp: 98.9 F (37.2 C) 98.4 F (36.9 C) 99 F (37.2 C) 98.6 F (37 C)  TempSrc:  Axillary Oral Oral Oral  SpO2: 100% 99% 99% 100%  Weight:      Height:        Intake/Output Summary (Last 24 hours) at 09/18/2018 1148 Last data filed at 09/18/2018 0657 Gross per 24 hour  Intake -  Output 900 ml  Net -900 ml   Filed Weights   09/12/18 0530 09/14/18 0300 09/16/18 2110  Weight: 91.6 kg 91.7 kg 89.6 kg    Examination:  General exam: Calm and comfortable, not in any distress Respiratory system: Clear to auscultation. Respiratory effort normal.  Rhonchi Cardiovascular system: S1 & S2 heard, RRR. No JVD, murmurs,  Gastrointestinal system: Abdomen is soft, nontender, nondistended with good bowel sounds  Central nervous system: Alert and oriented.  Grossly nonfocal Extremities: Symmetric 5 x 5 power.  No pedal edema no cyanosis or clubbing Skin: No rashes, lesions or ulcers Psychiatry:  Mood & affect appropriate.     Data Reviewed: I have personally reviewed following labs and imaging studies  CBC: Recent Labs  Lab 09/13/18 0417 09/14/18 0428 09/15/18 0315 09/17/18 0445  WBC 4.7 3.8* 4.2 6.2  NEUTROABS  --   --   --  3.2  HGB 12.0* 11.4* 12.8* 11.9*  HCT 34.9* 33.0* 38.4* 34.7*  MCV 100.6* 100.0 100.5* 100.6*  PLT 125* 141* 211 288   Basic Metabolic Panel: Recent Labs  Lab 09/13/18 0417 09/14/18 0428 09/15/18 0315 09/16/18 0400 09/17/18 0445  NA 140 140 140  138 139  K 3.1* 3.5 3.1* 3.5 3.5  CL 105 107 105 105 103  CO2 21* 22 22 24 25   GLUCOSE 103* 104* 100* 94 90  BUN 5* <5* <5* 7 8  CREATININE 0.73 0.71 0.70 0.87 1.12  CALCIUM 8.4* 8.7* 9.1 9.0 9.4  MG 1.5* 1.6* 1.9 1.7 1.7  PHOS 3.8 2.5 4.2 4.5 5.6*   GFR: Estimated Creatinine Clearance: 99 mL/min (by C-G formula based on SCr of 1.12 mg/dL). Liver Function Tests: No results for input(s): AST, ALT, ALKPHOS, BILITOT, PROT, ALBUMIN in the last 168 hours. No results for input(s): LIPASE, AMYLASE in the last 168 hours. No results for input(s): AMMONIA in the last 168 hours. Coagulation  Profile: No results for input(s): INR, PROTIME in the last 168 hours. Cardiac Enzymes: No results for input(s): CKTOTAL, CKMB, CKMBINDEX, TROPONINI in the last 168 hours. BNP (last 3 results) No results for input(s): PROBNP in the last 8760 hours. HbA1C: No results for input(s): HGBA1C in the last 72 hours. CBG: No results for input(s): GLUCAP in the last 168 hours. Lipid Profile: No results for input(s): CHOL, HDL, LDLCALC, TRIG, CHOLHDL, LDLDIRECT in the last 72 hours. Thyroid Function Tests: No results for input(s): TSH, T4TOTAL, FREET4, T3FREE, THYROIDAB in the last 72 hours. Anemia Panel: No results for input(s): VITAMINB12, FOLATE, FERRITIN, TIBC, IRON, RETICCTPCT in the last 72 hours. Sepsis Labs: No results for input(s): PROCALCITON, LATICACIDVEN in the last 168 hours.  Recent Results (from the past 240 hour(s))  MRSA PCR Screening     Status: None   Collection Time: 09/08/18 12:16 PM  Result Value Ref Range Status   MRSA by PCR NEGATIVE NEGATIVE Final    Comment:        The GeneXpert MRSA Assay (FDA approved for NASAL specimens only), is one component of a comprehensive MRSA colonization surveillance program. It is not intended to diagnose MRSA infection nor to guide or monitor treatment for MRSA infections. Performed at [facility], [address], [place], [place] [address]   Culture, blood (Routine X 2) w Reflex to ID Panel     Status: None   Collection Time: 09/09/18  3:41 AM  Result Value Ref Range Status   Specimen Description BLOOD LEFT HAND  Final   Special Requests   Final    BOTTLES DRAWN AEROBIC ONLY Blood Culture results may not be optimal due to an inadequate volume of blood received in culture bottles   Culture   Final    NO GROWTH 5 DAYS Performed at [facility], [address] [place], [place] [address]    Report Status 09/14/2018 FINAL  Final  Culture, blood (Routine X 2) w Reflex to ID Panel     Status: None   Collection Time:  09/09/18  3:47 AM  Result Value Ref Range Status   Specimen Description BLOOD LEFT HAND  Final   Special Requests   Final    BOTTLES DRAWN AEROBIC ONLY Blood Culture results may not be optimal due to an inadequate volume of blood received in culture bottles   Culture   Final    NO GROWTH 5 DAYS Performed at [facility], [address] [place], [place] [address]    Report Status 09/14/2018 FINAL  Final         Radiology Studies: No results found.      Scheduled Meds: . cloNIDine  0.3 mg Oral TID  . feeding supplement (ENSURE ENLIVE)  237 mL Oral BID BM  . folic acid  1 mg Oral Daily  . levETIRAcetam  500 mg Oral BID  . LORazepam  1 mg Intravenous Q8H  . multivitamin with minerals  1 tablet Oral Daily  . QUEtiapine  50 mg Oral QHS  . thiamine  100 mg Oral Daily   Continuous Infusions: . sodium chloride Stopped (09/14/18 2117)  . lactated ringers with kcl 10 mL/hr at 09/17/18 0001     LOS: 10 days    Time spent: 29 minutes.     Kathlen Mody, MD [facility] Pager [phone]  If 7PM-7AM, please contact night-coverage [email or web address] Password [facility] 09/18/2018, 11:48 AM

## 2018-09-19 DIAGNOSIS — G40901 Epilepsy, unspecified, not intractable, with status epilepticus: Secondary | ICD-10-CM

## 2018-09-19 DIAGNOSIS — Z720 Tobacco use: Secondary | ICD-10-CM

## 2018-09-19 DIAGNOSIS — S069X9A Unspecified intracranial injury with loss of consciousness of unspecified duration, initial encounter: Secondary | ICD-10-CM

## 2018-09-19 DIAGNOSIS — S069X0A Unspecified intracranial injury without loss of consciousness, initial encounter: Secondary | ICD-10-CM

## 2018-09-19 DIAGNOSIS — F1010 Alcohol abuse, uncomplicated: Secondary | ICD-10-CM

## 2018-09-19 DIAGNOSIS — S069XAA Unspecified intracranial injury with loss of consciousness status unknown, initial encounter: Secondary | ICD-10-CM

## 2018-09-19 DIAGNOSIS — D62 Acute posthemorrhagic anemia: Secondary | ICD-10-CM

## 2018-09-19 LAB — VITAMIN B1: Vitamin B1 (Thiamine): 183 nmol/L (ref 66.5–200.0)

## 2018-09-19 MED ORDER — LORAZEPAM 1 MG PO TABS: 1.0000 mg | ORAL_TABLET | ORAL | Status: DC

## 2018-09-19 MED ORDER — SODIUM CHLORIDE 0.9 % IV SOLN
INTRAVENOUS | Status: AC
  Administered 2018-09-19: 15:00:00 via INTRAVENOUS

## 2018-09-19 MED ORDER — LORAZEPAM 1 MG PO TABS
1.0000 mg | ORAL_TABLET | Freq: Three times a day (TID) | ORAL | Status: DC | PRN
  Administered 2018-09-19: 1 mg via ORAL
  Filled 2018-09-19: qty 1

## 2018-09-19 NOTE — Progress Notes (Signed)
Inpatient Rehabilitation Admissions Coordinator  I met with patient with his Mom, Fraser Din at bedside with his permission. I discussed the recommendation for an inpt rehab admission to assist with his functional level improvement prior to d/c home. Patient minimizes the need for further physical rehab. He is aware that he had seizures at home and hit his head. With continued discussions admits to heavy alcohol usage. He has agreed for me to meet again with he and his [parents at 10 am to further discuss his rehab venue options. 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 Mom states she and her husband to stay here in [facility] for as long as needed.  Danne Baxter, RN, MSN Rehab Admissions Coordinator [phone] 09/19/2018 3:30 PM

## 2018-09-19 NOTE — Progress Notes (Signed)
Rehab Admissions Coordinator Note:  Patient was screened by Clois Dupes for appropriateness for an Inpatient Acute Rehab Consult per PT recommendations. Noted OT and SLP evals pending. At this time, we are recommending Inpatient Rehab consult. Please place order for consult.  Ottie Glazier, RN, MSN Rehab Admissions Coordinator [phone] 09/19/2018 9:57 AM

## 2018-09-19 NOTE — Evaluation (Signed)
Physical Therapy Evaluation Patient Details Name: Darren Diaz MRN: [id number] DOB: [birth year] Today's Date: 09/19/2018   History of Present Illness  Patient is a 32 y/o male presenting to the ED on 09/08/18 due to seizures. CT of head showed a small intraparenchymal hemorrhage in the right temporal lobe and questionable small subarachnoid hemorrhage with large right parietal occipital hematoma without fracture. Intubated on 09/08/18, extubated on 08/13/18. 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    Clinical Impression  [name] admitted with the above listed diagnosis. Patient reports that prior to admission he was Ind with all mobility, ADLs, and worked full time as a Comptroller. Patient today requiring up to Min A for transfers and mobility with noted unsteadiness. Patient demonstrating posterior lean in static stance requiring external support for balance. Poor safety awareness and difficulty sequencing with tasks with consistent cueing throughout to complete mobility successfully. Nursing in room during mobility for patient/therapist safety. Noted spike in HR up to 150's with limited mobility - nursing notified. PT ordering SLP and OT due to noted deficits in PT evaluation. PT to recommend CIR level therapies at discharge to continue to progress balance and mobility to maximize safe and independent functional mobility prior to return home.     Follow Up Recommendations CIR    Equipment Recommendations  Other (comment)(defer)    Recommendations for Other Services Rehab consult;OT consult;Speech consult     Precautions / Restrictions Precautions Precautions: Fall Restrictions Weight Bearing Restrictions: No      Mobility  Bed Mobility Overal bed mobility: Needs Assistance Bed Mobility: Supine to Sit     Supine to sit: Min guard     General bed mobility comments: for safety; impulsive  Transfers Overall transfer level: Needs  assistance Equipment used: Rolling walker (2 wheeled) Transfers: Sit to/from Stand Sit to Stand: Min assist         General transfer comment: very unsteady with poor safety awareness and sequencing; uses posterior LE on EOB for balance with posterior lean noted; weight bearing through heels with toes elevated  Ambulation/Gait Ambulation/Gait assistance: Min assist Gait Distance (Feet): 12 Feet Assistive device: Rolling walker (2 wheeled) Gait Pattern/deviations: Step-through pattern;Decreased stride length;Drifts right/left;Trunk flexed Gait velocity: decreased   General Gait Details: unsteadiness requiring Min A - short distance in room due to noted high HR - up to ~150s with sit to stand transfers. Return to baseline of 110's with seated rest; poor safety awareness  Stairs            Wheelchair Mobility    Modified Rankin (Stroke Patients Only) Modified Rankin (Stroke Patients Only) Pre-Morbid Rankin Score: No symptoms Modified Rankin: Moderately severe disability     Balance Overall balance assessment: Needs assistance Sitting-balance support: No upper extremity supported;Feet supported Sitting balance-Leahy Scale: Fair     Standing balance support: Bilateral upper extremity supported;During functional activity Standing balance-Leahy Scale: Poor Standing balance comment: external support required                             Pertinent Vitals/Pain Pain Assessment: No/denies pain    Home Living Family/patient expects to be discharged to:: Private residence Living Arrangements: Alone Available Help at Discharge: Family;Available PRN/intermittently Type of Home: House Home Access: Stairs to enter Entrance Stairs-Rails: Can reach both;Right;Left Entrance Stairs-Number of Steps: 3 Home Layout: Two level Home Equipment: None      Prior Function Level of  Independence: Independent         Comments: works full time Chiropractor        Extremity/Trunk Assessment   Upper Extremity Assessment Upper Extremity Assessment: Defer to OT evaluation    Lower Extremity Assessment Lower Extremity Assessment: Generalized weakness(uses posterior LE on EOB for balance)    Cervical / Trunk Assessment Cervical / Trunk Assessment: Normal  Communication   Communication: No difficulties  Cognition Arousal/Alertness: Awake/alert Behavior During Therapy: WFL for tasks assessed/performed;Impulsive;Restless Overall Cognitive Status: Impaired/Different from baseline Area of Impairment: Following commands;Safety/judgement;Problem solving                       Following Commands: Follows one step commands consistently;Follows one step commands with increased time Safety/Judgement: Decreased awareness of safety;Decreased awareness of deficits   Problem Solving: Slow processing;Difficulty sequencing;Requires verbal cues General Comments: patient with some difficulty processing and sequencing. Requires consistent cueing throughout; PT ordering SLP evaluation      General Comments General comments (skin integrity, edema, etc.): nursing notified of HR and BP - BP sitting 135/83, BP standing 110/60's - denied dizziness/lightheadedness    Exercises     Assessment/Plan    PT Assessment Patient needs continued PT services  PT Problem List Decreased strength;Decreased activity tolerance;Decreased balance;Decreased mobility;Decreased knowledge of use of DME;Decreased safety awareness       PT Treatment Interventions DME instruction;Gait training;Stair training;Functional mobility training;Therapeutic activities;Therapeutic exercise;Balance training;Neuromuscular re-education;Patient/family education    PT Goals (Current goals can be found in the Care Plan section)  Acute Rehab PT Goals Patient Stated Goal: none stated PT Goal Formulation: With patient Time For Goal Achievement: 10/03/18 Potential  to Achieve Goals: Good    Frequency Min 4X/week   Barriers to discharge        Co-evaluation               AM-PAC PT "6 Clicks" Mobility  Outcome Measure Help needed turning from your back to your side while in a flat bed without using bedrails?: A Little Help needed moving from lying on your back to sitting on the side of a flat bed without using bedrails?: A Little Help needed moving to and from a bed to a chair (including a wheelchair)?: A Little Help needed standing up from a chair using your arms (e.g., wheelchair or bedside chair)?: A Little Help needed to walk in hospital room?: A Lot Help needed climbing 3-5 steps with a railing? : Total 6 Click Score: 15    End of Session Equipment Utilized During Treatment: Gait belt Activity Tolerance: Patient tolerated treatment well Patient left: in chair;with call bell/phone within reach;with chair alarm set Nurse Communication: Mobility status PT Visit Diagnosis: Unsteadiness on feet (R26.81);Other abnormalities of gait and mobility (R26.89);Muscle weakness (generalized) (M62.81)    Time: 3790-2409 PT Time Calculation (min) (ACUTE ONLY): 27 min   Charges:   PT Evaluation $PT Eval Moderate Complexity: 1 Mod PT Treatments $Therapeutic Activity: 8-22 mins       Kipp Laurence, PT, DPT Supplemental Physical Therapist 09/19/18 9:48 AM Pager: [phone] Office: [phone]

## 2018-09-19 NOTE — Consult Note (Signed)
Physical Medicine and Rehabilitation Consult Reason for Consult:  Decreased functional mobility Referring Physician: [facility]  HPI: Darren Diaz is a 32 y.o.right handed male with history of tobacco/alcohol abuse. Per chart review and patient, patient lives alone. 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 Parents are from [place] presently in the area planned to assist on discharge. Presented 09/08/2018 after reported seizure with noted fall, alcohol level 125, potassium 3.3. Mother had reported patient struck his head with noted fall. CT of the head reviewed, showing right IPH.  Per report, small intraparenchymal hemorrhage in the right temporal lobe and questionable small subarachnoid hemorrhage with large right parietal occipital hematoma without fracture. He was loaded with Keppra. Patient did require intubation for airway protection. Latest follow-up MRI the brain 09/18/2018 brain parenchymal appear normal. Resolving scalp hematoma. Neurosurgery Dr. Johnsie Cancel follow-up with conservative care. EEG negative for seizure. Patient monitored closely for alcohol withdrawal. Tolerating a regular diet.Therapy evaluations completed with recommendations of physical medicine rehabilitation consult.  Review of Systems  Constitutional: Negative for chills and fever.  HENT: Negative for hearing loss.   Eyes: Negative for blurred vision and double vision.  Respiratory: Negative for cough and shortness of breath.   Cardiovascular: Negative for chest pain, palpitations and leg swelling.  Gastrointestinal: Positive for constipation. Negative for nausea and vomiting.  Genitourinary: Negative for dysuria, flank pain and hematuria.  Musculoskeletal: Positive for myalgias.  Skin: Negative for rash.  Neurological: Positive for seizures.  All other systems reviewed and are negative.  Past Medical History:  Diagnosis Date  . Seizures  (HCC)    Past Surgical History:  Procedure Laterality Date  . 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 Social History:  reports that he has been smoking. He has never used smokeless tobacco. He reports that he drinks about 6.0 standard drinks of alcohol per week. He reports that he does not use drugs. Allergies: No Known Allergies Medications Prior to Admission  Medication Sig Dispense Refill  . pyridOXINE (VITAMIN B-6) 100 MG tablet Take 100 mg by mouth daily.      Home: Home Living Family/patient expects to be discharged to:: Private residence Living Arrangements: Alone Available Help at Discharge: Family, Available PRN/intermittently Type of Home: House Home Access: Stairs to enter Secretary/administrator of Steps: 3 Entrance Stairs-Rails: Can reach both, Right, Left Home Layout: Two level Alternate Level Stairs-Number of Steps: 14 Alternate Level Stairs-Rails: Left Bathroom Shower/Tub: Tub/shower unit, Walk-in shower(typically uses walk-in) Firefighter: Standard Home Equipment: None  Functional History: Prior Function Level of Independence: Independent Comments: works full time Web designer Status:  Mobility: Bed Mobility Overal bed mobility: Needs Assistance Bed Mobility: Supine to Sit Supine to sit: Min guard General bed mobility comments: for safety; impulsive Transfers Overall transfer level: Needs assistance Equipment used: Rolling walker (2 wheeled) Transfers: Sit to/from Stand Sit to Stand: Min assist General transfer comment: very unsteady with poor safety awareness and sequencing; uses posterior LE on EOB for balance with posterior lean noted; weight bearing through heels with toes elevated Ambulation/Gait Ambulation/Gait assistance: Min assist Gait Distance (Feet): 12 Feet Assistive device: Rolling walker (2 wheeled) Gait Pattern/deviations: Step-through pattern, Decreased stride length, Drifts  right/left, Trunk flexed General Gait Details: unsteadiness requiring Min A - short distance in room due to noted high HR - up to ~150s with sit to stand transfers.  Return to baseline of 110's with seated rest; poor safety awareness Gait velocity: decreased    ADL:    Cognition: Cognition Overall Cognitive Status: Impaired/Different from baseline Orientation Level: Oriented to person, Oriented to place, Oriented to situation Cognition Arousal/Alertness: Awake/alert Behavior During Therapy: WFL for tasks assessed/performed, Impulsive, Restless Overall Cognitive Status: Impaired/Different from baseline Area of Impairment: Following commands, Safety/judgement, Problem solving Following Commands: Follows one step commands consistently, Follows one step commands with increased time Safety/Judgement: Decreased awareness of safety, Decreased awareness of deficits Problem Solving: Slow processing, Difficulty sequencing, Requires verbal cues General Comments: patient with some difficulty processing and sequencing. Requires consistent cueing throughout; PT ordering SLP evaluation  Blood pressure 134/82, pulse 82, temperature 99.4 F (37.4 C), temperature source Oral, resp. rate 16, height 5\' 11"  (1.803 m), weight 89.3 kg, SpO2 100 %. Physical Exam  Vitals reviewed. Constitutional: He is oriented to person, place, and time. He appears well-developed and well-nourished.  HENT:  Head: Normocephalic and atraumatic.  Eyes: EOM are normal. Right eye exhibits no discharge. Left eye exhibits no discharge.  Neck: Normal range of motion. Neck supple. No thyromegaly present.  Cardiovascular: Normal rate, regular rhythm and normal heart sounds.  Respiratory: Effort normal and breath sounds normal. No respiratory distress.  GI: Soft. Bowel sounds are normal. He exhibits no distension.  Musculoskeletal:  No edema or tenderness in extremities  Neurological: He is alert and oriented to person, place, and  time.  Follows full commands Motor: 5/5 throughout  Skin: Skin is warm and dry.  Psychiatric: His speech is normal. He expresses impulsivity.    Results for orders placed or performed during the hospital encounter of 09/08/18 (from the past 24 hour(s))  Basic metabolic panel     Status: Abnormal   Collection Time: 09/18/18 11:57 AM  Result Value Ref Range   Sodium 139 135 - 145 mmol/L   Potassium 3.9 3.5 - 5.1 mmol/L   Chloride 107 98 - 111 mmol/L   CO2 20 (L) 22 - 32 mmol/L   Glucose, Bld 110 (H) 70 - 99 mg/dL   BUN 9 6 - 20 mg/dL   Creatinine, Ser 3.00 0.61 - 1.24 mg/dL   Calcium 9.1 8.9 - 76.2 mg/dL   GFR calc non Af Amer >60 >60 mL/min   GFR calc Af Amer >60 >60 mL/min   Anion gap 12 5 - 15   Mr Brain W Wo Contrast  Result Date: 09/18/2018 CLINICAL DATA:  Followup subarachnoid hemorrhage on the right. EXAM: MRI HEAD WITHOUT AND WITH CONTRAST TECHNIQUE: Multiplanar, multiecho pulse sequences of the brain and surrounding structures were obtained without and with intravenous contrast. CONTRAST:  8 cc Gadavist COMPARISON:  Multiple prior CT studies most recently 09/11/2018. MRI 09/08/2018. FINDINGS: Brain: Diffusion imaging does not show any acute or subacute infarction. The brainstem and cerebellum are normal. Cerebral hemispheres are normal without evidence of old small or large vessel infarction. There has been resolution of the previously seen subarachnoid hemorrhage in the right temporal and sylvian region. No evidence of residual signal abnormality or hemosiderin deposition. No mass lesion, hydrocephalus or extra-axial collection. No abnormal contrast enhancement. Vascular: Major vessels at the base of the brain show flow. Skull and upper cervical spine: Negative Sinuses/Orbits: Paranasal sinuses are clear. Mastoid effusion present on the left, not noted previously. Other: Resolving scalp hematoma in the right parietal region. IMPRESSION: Brain parenchyma appears normal today. No  evidence of residual intracranial hemorrhage. No sign that there was an intraparenchymal component. Left  mastoid effusion, not seen previously. Resolving right parietal scalp hematoma and edema. Electronically Signed   By: Paulina Fusi M.D.   On: 09/18/2018 12:16    Assessment/Plan: Diagnosis: TBI and Seizures Labs and images (see above) independently reviewed.  Records reviewed and summated above.  [place] score:  >/VIII  Speech to evaluate for Post traumatic amnesia and interval GOAT scores to assess progress.  NeuroPsych evaluation for behavorial assessment.  Provide environmental management by reducing the level of stimulation, tolerating restlessness when possible, protecting patient from harming self or others and reducing patient's cognitive confusion.  Address behavioral concerns include providing structured environments and daily routines.  Cognitive therapy to direct modular abilities in order to maintain goals  including problem solving, self regulation/monitoring, self management, attention, and memory.  Fall precautions; pt at risk for second impact syndrome  Prevention of secondary injury: monitor for hypotension, hypoxia, seizures or signs of increased ICP  AED:   Avoid medications that could impair cognitive abilities, such as anticholinergics, antihistaminic, benzodiazapines, narcotics, etc when possible  1. Does the need for close, 24 hr/day medical supervision in concert with the patient's rehab needs make it unreasonable for this patient to be served in a less intensive setting? Potentially  2. Co-Morbidities requiring supervision/potential complications: tobacco/alcohol abuse (counsel when appropriate), seizures (continue meds), ABLA (repeat labs, transfuse to ensure appropriate perfusion for increased activity tolerance) 3. Due to safety, disease management, medication administration and patient education, does the patient require 24 hr/day rehab nursing?  Potentially 4. Does the patient require coordinated care of a physician, rehab nurse, PT (1-2 hrs/day, 5 days/week), OT (1-2 hrs/day, 5 days/week) and SLP (1-2 hrs/day, 5 days/week) to address physical and functional deficits in the context of the above medical diagnosis(es)? Yes Addressing deficits in the following areas: balance, endurance, locomotion, strength, transferring, bathing, dressing, toileting, cognition, language and psychosocial support 5. Can the patient actively participate in an intensive therapy program of at least 3 hrs of therapy per day at least 5 days per week? Yes 6. The potential for patient to make measurable gains while on inpatient rehab is excellent 7. Anticipated functional outcomes upon discharge from inpatient rehab are modified independent and supervision  with PT, modified independent and supervision with OT, modified independent and supervision with SLP. 8. Estimated rehab length of stay to reach the above functional goals is: 8-12 days. 9. Anticipated D/C setting: Home 10. Anticipated post D/C treatments: HH therapy and Home excercise program 11. Overall Rehab/Functional Prognosis: excellent and good  RECOMMENDATIONS: This patient's condition is appropriate for continued rehabilitative care in the following setting: Potentially CIR.  Will monitor for progress in therapies and await medical appropriateness.  Patient currently not in favor of CIR, recommended discussion with family. Patient has agreed to participate in recommended program. Potentially Note that insurance prior authorization may be required for reimbursement for recommended care.  Comment: Rehab Admissions Coordinator to follow up.   I have personally performed a face to face diagnostic evaluation, including, but not limited to relevant history and physical exam findings, of this patient and developed relevant assessment and plan.  Additionally, I have reviewed and concur with the physician  assistant's documentation above.   Maryla Morrow, MD, ABPMR Mcarthur Rossetti Angiulli, PA-C 09/19/2018

## 2018-09-19 NOTE — Progress Notes (Signed)
PROGRESS NOTE    [place]  YHC:[id number] DOB: [birth year] DOA: 09/08/2018 PCP: Patient, No Pcp Per   Brief Narrative: 32 year old male with history of alcohol abuse presented with witnessed seizure and fall and found with small ICH.  Previously required Precedex for agitation secondary delirium tremens.  He has now been weaned off and transferred to [facility] service.    Assessment & Plan:   Active Problems:   Seizures (HCC)   Alcohol abuse   Status epilepticus (HCC)   TBI (traumatic brain injury) (HCC)   Tobacco abuse   Acute blood loss anemia   Alcohol withdrawal/ Delirium tremens  Appears to have resolved.  Off precedex and continue with seroquel.  Will plan to titrate the clonidine off .  Continue with thiamine and folate.    Hypokalemia and hypomagnesemia: Replaced.    Lower extremity neuropathy;  -further work-up with an EMG and nerve conduction study as an outpatient with Dr. Allena Katz. - continue with thiamine.    Seizures sec to SAH/ alcohol abuse:  Patient had 2 generalized tonic-clonic seizures on Saturday night.  Patient currently is back to baseline at this time.  Neurology reconsulted for recommendations.  Patient initially was on Keppra 500 mg twice daily, neurology increased to 2000 mg twice daily.  MRI of the brain with and without contrast does not show any intracranial bleed or any other acute abnormalities.    Unsteadiness:  PT evaluation recommending CIR.  CIR consult placed   Macrocytic anemia : - hemoglobin stable between 11 to 12.    Prolonged QT interval:  - replace potassium and keep it greater than 4 and magnesium greater than 2.  Repeat EKG unchanged from before.  Orthostatic hypotension and tachycardia on ambulation IV fluids with normal saline at 75 mL/h started.   DVT prophylaxis: SCDs Code Status:  Full code.  Family Communication: None at bedside today Disposition Plan: CIR pending   Consultants:   PCCM   neurology Dr.  Wilford Corner  Procedures: none.     Antimicrobials: None.    Subjective: Patient is alert and afebrile and comfortable.  Patient denies any chest pain or shortness of breath.  Objective: Vitals:   09/19/18 0338 09/19/18 0500 09/19/18 0847 09/19/18 1142  BP: 130/83  134/82 120/79  Pulse: (!) 58  82 71  Resp: 17  16 20   Temp: 97.9 F (36.6 C)  99.4 F (37.4 C) 98.9 F (37.2 C)  TempSrc: Oral  Oral Oral  SpO2: 100%  100% 100%  Weight:  89.3 kg    Height:        Intake/Output Summary (Last 24 hours) at 09/19/2018 1321 Last data filed at 09/19/2018 0848 Gross per 24 hour  Intake 480 ml  Output 300 ml  Net 180 ml   Filed Weights   09/14/18 0300 09/16/18 2110 09/19/18 0500  Weight: 91.7 kg 89.6 kg 89.3 kg    Examination:  General exam: Sitting in chair denies any complaints Respiratory system: Clear to auscultation bilaterally. Cardiovascular system: S1 & S2 heard, RRR. No JVD, murmurs,  Gastrointestinal system: Abdomen is soft, nontender, nondistended with good bowel sounds Central nervous system: Alert and oriented.  Grossly nonfocal Extremities: Symmetric 5 x 5 power.  No pedal edema no cyanosis or clubbing Skin: No rashes, lesions or ulcers Psychiatry:  Mood & affect appropriate.     Data Reviewed: I have personally reviewed following labs and imaging studies  CBC: Recent Labs  Lab 09/13/18 0417 09/14/18 0428 09/15/18 0315 09/17/18 0445  WBC 4.7 3.8* 4.2 6.2  NEUTROABS  --   --   --  3.2  HGB 12.0* 11.4* 12.8* 11.9*  HCT 34.9* 33.0* 38.4* 34.7*  MCV 100.6* 100.0 100.5* 100.6*  PLT 125* 141* 211 288   Basic Metabolic Panel: Recent Labs  Lab 09/13/18 0417 09/14/18 0428 09/15/18 0315 09/16/18 0400 09/17/18 0445 09/18/18 1157  NA 140 140 140 138 139 139  K 3.1* 3.5 3.1* 3.5 3.5 3.9  CL 105 107 105 105 103 107  CO2 21* 22 22 24 25  20*  GLUCOSE 103* 104* 100* 94 90 110*  BUN 5* <5* <5* 7 8 9   CREATININE 0.73 0.71 0.70 0.87 1.12 0.91  CALCIUM 8.4*  8.7* 9.1 9.0 9.4 9.1  MG 1.5* 1.6* 1.9 1.7 1.7  --   PHOS 3.8 2.5 4.2 4.5 5.6*  --    GFR: Estimated Creatinine Clearance: 121.8 mL/min (by C-G formula based on SCr of 0.91 mg/dL). Liver Function Tests: No results for input(s): AST, ALT, ALKPHOS, BILITOT, PROT, ALBUMIN in the last 168 hours. No results for input(s): LIPASE, AMYLASE in the last 168 hours. No results for input(s): AMMONIA in the last 168 hours. Coagulation Profile: No results for input(s): INR, PROTIME in the last 168 hours. Cardiac Enzymes: No results for input(s): CKTOTAL, CKMB, CKMBINDEX, TROPONINI in the last 168 hours. BNP (last 3 results) No results for input(s): PROBNP in the last 8760 hours. HbA1C: No results for input(s): HGBA1C in the last 72 hours. CBG: No results for input(s): GLUCAP in the last 168 hours. Lipid Profile: No results for input(s): CHOL, HDL, LDLCALC, TRIG, CHOLHDL, LDLDIRECT in the last 72 hours. Thyroid Function Tests: No results for input(s): TSH, T4TOTAL, FREET4, T3FREE, THYROIDAB in the last 72 hours. Anemia Panel: No results for input(s): VITAMINB12, FOLATE, FERRITIN, TIBC, IRON, RETICCTPCT in the last 72 hours. Sepsis Labs: No results for input(s): PROCALCITON, LATICACIDVEN in the last 168 hours.  No results found for this or any previous visit (from the past 240 hour(s)).       Radiology Studies: Mr Laqueta Jean Wo Contrast  Result Date: 09/18/2018 CLINICAL DATA:  Followup subarachnoid hemorrhage on the right. EXAM: MRI HEAD WITHOUT AND WITH CONTRAST TECHNIQUE: Multiplanar, multiecho pulse sequences of the brain and surrounding structures were obtained without and with intravenous contrast. CONTRAST:  8 cc Gadavist COMPARISON:  Multiple prior CT studies most recently 09/11/2018. MRI 09/08/2018. FINDINGS: Brain: Diffusion imaging does not show any acute or subacute infarction. The brainstem and cerebellum are normal. Cerebral hemispheres are normal without evidence of old small or large  vessel infarction. There has been resolution of the previously seen subarachnoid hemorrhage in the right temporal and sylvian region. No evidence of residual signal abnormality or hemosiderin deposition. No mass lesion, hydrocephalus or extra-axial collection. No abnormal contrast enhancement. Vascular: Major vessels at the base of the brain show flow. Skull and upper cervical spine: Negative Sinuses/Orbits: Paranasal sinuses are clear. Mastoid effusion present on the left, not noted previously. Other: Resolving scalp hematoma in the right parietal region. IMPRESSION: Brain parenchyma appears normal today. No evidence of residual intracranial hemorrhage. No sign that there was an intraparenchymal component. Left mastoid effusion, not seen previously. Resolving right parietal scalp hematoma and edema. Electronically Signed   By: Paulina Fusi M.D.   On: 09/18/2018 12:16        Scheduled Meds: . cloNIDine  0.2 mg Oral TID  . feeding supplement (ENSURE ENLIVE)  237 mL Oral BID BM  . folic  acid  1 mg Oral Daily  . levETIRAcetam  1,000 mg Oral BID  . LORazepam  1 mg Intravenous Q8H  . multivitamin with minerals  1 tablet Oral Daily  . QUEtiapine  50 mg Oral QHS  . thiamine  100 mg Oral Daily   Continuous Infusions: . sodium chloride       LOS: 11 days    Time spent: 28 minutes.     Kathlen Mody, MD [facility] Pager [phone]  If 7PM-7AM, please contact night-coverage [email or web address] Password [email or web address] 09/19/2018, 1:21 PM

## 2018-09-19 NOTE — Progress Notes (Signed)
Pt extremely agitated and combative, he has removed his telemetry leads and demanding to be discharged.  He is out of bed, very unsteady, stumbling around room trying to change clothes.  Upset about plans for rehab, states he will get therapy "on my own time" and he needs to get home so he can go back to work.  RN and pt mother in room.  Pt not able to be reasoned with, charge nurse in to help de-escalate.  Security also notified, but when they arrived, pt sitting on edge of bed, seeming a bit more cooperative, they did not come because staff felt it would agitate patient more.  Pt did not receive his last dose of ativan due to loss of IV access, IV team was previously paged, but pt refusing new start upon their arrival.  Dr. Blake Divine paged, received order for p.o. Ativan, pt agreed to take.

## 2018-09-20 DIAGNOSIS — F1020 Alcohol dependence, uncomplicated: Secondary | ICD-10-CM

## 2018-09-20 LAB — LEVETIRACETAM LEVEL: Levetiracetam Lvl: 6.2 ug/mL — ABNORMAL LOW (ref 10.0–40.0)

## 2018-09-20 MED ORDER — LORAZEPAM 1 MG PO TABS: 1.0000 mg | ORAL_TABLET | Freq: Three times a day (TID) | ORAL | Status: DC | PRN

## 2018-09-20 MED ORDER — CLONIDINE HCL 0.1 MG PO TABS
0.1000 mg | ORAL_TABLET | Freq: Three times a day (TID) | ORAL | Status: DC
  Administered 2018-09-21 (×2): 0.1 mg via ORAL
  Filled 2018-09-20 (×2): qty 1

## 2018-09-20 MED ORDER — GABAPENTIN 600 MG PO TABS
300.0000 mg | ORAL_TABLET | Freq: Two times a day (BID) | ORAL | Status: DC
  Administered 2018-09-20 – 2018-09-21 (×2): 300 mg via ORAL
  Filled 2018-09-20 (×2): qty 1

## 2018-09-20 NOTE — Progress Notes (Signed)
PROGRESS NOTE    Darren Diaz  ZOX:[id number] DOB: Mar 26, 1986 DOA: 09/08/2018 PCP: Patient, No Pcp Per   Brief Narrative: 32 year old male with history of alcohol abuse presented with witnessed seizure and fall and found with small ICH.  Previously required Precedex for agitation secondary delirium tremens.  He has now been weaned off and transferred to [facility] service.    Assessment & Plan:   Active Problems:   Seizures (HCC)   Alcohol abuse   Status epilepticus (HCC)   TBI (traumatic brain injury) (HCC)   Tobacco abuse   Acute blood loss anemia   Alcohol withdrawal/ Delirium tremens  Appears to have resolved.  Off precedex and continue with seroquel.  Will plan to titrate the clonidine off .  Continue with thiamine and folate.    Hypokalemia and hypomagnesemia: Replaced.    Lower extremity neuropathy;  -further work-up with an EMG and nerve conduction study as an outpatient with Dr. Allena Katz. - continue with thiamine.    Seizures sec to SAH/ alcohol abuse:  Patient had 2 generalized tonic-clonic seizures on Saturday night.  Patient currently is back to baseline at this time.  Neurology reconsulted for recommendations.  Patient initially was on Keppra 500 mg twice daily, neurology increased to 1000 mg twice daily.  MRI of the brain with and without contrast does not show any intracranial bleed or any other acute abnormalities.    Unsteadiness:  PT evaluation recommending CIR.  CIR consult placed   Macrocytic anemia : - hemoglobin stable between 11 to 12.    Prolonged QT interval:  - replace potassium and keep it greater than 4 and magnesium greater than 2.  Repeat EKG unchanged from before.  Orthostatic hypotension and tachycardia on ambulation IV fluids with normal saline at 75 mL/h started.  Repeat orthostatic vital signs today.   On extensive discussion with the patient and mother and father at bedside, patient appears to have chronic history of depression and  anxiety with substance abuse and is currently not on any medications.  We will request social worker consult for resources and psychiatric consult for medication assistance.   DVT prophylaxis: SCDs Code Status:  Full code.  Family Communication: Mother and father at bedside today Disposition Plan: CIR pending   Consultants:   PCCM   neurology Dr. Wilford Corner  Procedures: none.     Antimicrobials: None.    Subjective: Patient is alert and afebrile and comfortable.  Patient reports no complaints  Objective: Vitals:   09/19/18 2014 09/20/18 0501 09/20/18 0742 09/20/18 1136  BP: 138/89 132/83 131/76 120/86  Pulse: 69 67 72 62  Resp: 16 17 16 16   Temp: 98.9 F (37.2 C)  98.3 F (36.8 C) 98.2 F (36.8 C)  TempSrc: Oral  Oral Oral  SpO2: 98% 99% 99% 100%  Weight:      Height:        Intake/Output Summary (Last 24 hours) at 09/20/2018 1244 Last data filed at 09/20/2018 1020 Gross per 24 hour  Intake -  Output 625 ml  Net -625 ml   Filed Weights   09/14/18 0300 09/16/18 2110 09/19/18 0500  Weight: 91.7 kg 89.6 kg 89.3 kg    Examination:  General exam: Alert and comfortable sitting in chair, not in any kind of distress Respiratory system: Clear to auscultation bilaterally.  No wheezing or rhonchi Cardiovascular system: S1 & S2 heard, RRR. No JVD, murmurs,  Gastrointestinal system: Abdomen is soft, nontender, nondistended,  good bowel sounds Central nervous system: Alert and  oriented.  Grossly nonfocal Extremities: Symmetric 5 x 5 power.  No pedal edema no cyanosis or clubbing Skin: No rashes, lesions or ulcers Psychiatry:  Mood & affect appropriate.     Data Reviewed: I have personally reviewed following labs and imaging studies  CBC: Recent Labs  Lab 09/14/18 0428 09/15/18 0315 09/17/18 0445  WBC 3.8* 4.2 6.2  NEUTROABS  --   --  3.2  HGB 11.4* 12.8* 11.9*  HCT 33.0* 38.4* 34.7*  MCV 100.0 100.5* 100.6*  PLT 141* 211 288   Basic Metabolic  Panel: Recent Labs  Lab 09/14/18 0428 09/15/18 0315 09/16/18 0400 09/17/18 0445 09/18/18 1157  NA 140 140 138 139 139  K 3.5 3.1* 3.5 3.5 3.9  CL 107 105 105 103 107  CO2 22 22 24 25  20*  GLUCOSE 104* 100* 94 90 110*  BUN <5* <5* 7 8 9   CREATININE 0.71 0.70 0.87 1.12 0.91  CALCIUM 8.7* 9.1 9.0 9.4 9.1  MG 1.6* 1.9 1.7 1.7  --   PHOS 2.5 4.2 4.5 5.6*  --    GFR: Estimated Creatinine Clearance: 121.8 mL/min (by C-G formula based on SCr of 0.91 mg/dL). Liver Function Tests: No results for input(s): AST, ALT, ALKPHOS, BILITOT, PROT, ALBUMIN in the last 168 hours. No results for input(s): LIPASE, AMYLASE in the last 168 hours. No results for input(s): AMMONIA in the last 168 hours. Coagulation Profile: No results for input(s): INR, PROTIME in the last 168 hours. Cardiac Enzymes: No results for input(s): CKTOTAL, CKMB, CKMBINDEX, TROPONINI in the last 168 hours. BNP (last 3 results) No results for input(s): PROBNP in the last 8760 hours. HbA1C: No results for input(s): HGBA1C in the last 72 hours. CBG: No results for input(s): GLUCAP in the last 168 hours. Lipid Profile: No results for input(s): CHOL, HDL, LDLCALC, TRIG, CHOLHDL, LDLDIRECT in the last 72 hours. Thyroid Function Tests: No results for input(s): TSH, T4TOTAL, FREET4, T3FREE, THYROIDAB in the last 72 hours. Anemia Panel: No results for input(s): VITAMINB12, FOLATE, FERRITIN, TIBC, IRON, RETICCTPCT in the last 72 hours. Sepsis Labs: No results for input(s): PROCALCITON, LATICACIDVEN in the last 168 hours.  No results found for this or any previous visit (from the past 240 hour(s)).       Radiology Studies: No results found.      Scheduled Meds: . cloNIDine  0.2 mg Oral TID  . feeding supplement (ENSURE ENLIVE)  237 mL Oral BID BM  . folic acid  1 mg Oral Daily  . levETIRAcetam  1,000 mg Oral BID  . LORazepam  1 mg Oral Q8 Weeks  . multivitamin with minerals  1 tablet Oral Daily  . QUEtiapine  50  mg Oral QHS  . thiamine  100 mg Oral Daily   Continuous Infusions:    LOS: 12 days    Time spent: 35 minutes.     Kathlen Mody, MD [facility] Pager [phone]  If 7PM-7AM, please contact night-coverage [email or web address] Password [facility] 09/20/2018, 12:44 PM

## 2018-09-20 NOTE — Consult Note (Signed)
[facility] Face-to-Face Psychiatry Consult   Reason for Consult:  "anxiety depression, agitated last night, profound alcohol abuse , leading to falls, seizures and withdrawal symptoms." Referring Physician:  Dr. Blake Divine  Patient Identification: Darren Diaz MRN:  [id number] Principal Diagnosis: Alcohol use disorder, severe, dependence (HCC) Diagnosis:  Active Problems:   Seizures (HCC)   Alcohol abuse   Status epilepticus (HCC)   TBI (traumatic brain injury) (HCC)   Tobacco abuse   Acute blood loss anemia   Total Time spent with patient: 1 hour  Subjective:   Darren Diaz is a 32 y.o. male patient admitted with seizure.  HPI:   Per chart review, patient presented with witnessed seizure and fall and was found to have a small ICH. He sustained a TBI. He required Precedex for agitation secondary to DTs. BAL was 125 and UDS was negative on admission. He has been agitated and demanding to leave the hospital. He is recommended for inpatient rehab to improve mobility and safety awareness. He was evaluated by neurology and is receiving Keppra 1000 mg BID for seizure prophylaxis. He is also receiving Seroquel 50 mg qhs. He received Ativan 1 mg yesterday for anxiety.   On interview, Darren Diaz denies feeling depressed or anxiety. He reports that his mood has been okay. He admits to feeling anxious and depressed as a teenager. He completed family therapy. He denies SI, HI or AVH. He denies problems with sleep or alcohol. He appears to minimize his alcohol use. He reports having one beer and 2 shots of whisky daily. He denies a prior history of withdrawals, DTs or seizures. He denies cravings for alcohol at this time. He reports getting a DUI in [place] when he was 32 years old. He denies a history of blackouts. He does not believe his use is problematic.   Past Psychiatric History: Alcohol abuse   Risk to Self:  None. Denies SI.  Risk to Others:  None. Denies HI.  Prior Inpatient Therapy:  Denies  Prior  Outpatient Therapy:  Denies   Past Medical History:  Past Medical History:  Diagnosis Date  . Seizures (HCC)     Past Surgical History:  Procedure Laterality Date  . 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 Family Psychiatric  History: Denies  Social History:  Social History   Substance and Sexual Activity  Alcohol Use Yes  . Alcohol/week: 6.0 standard drinks  . Types: 3 Cans of beer, 3 Standard drinks or equivalent per week     Social History   Substance and Sexual Activity  Drug Use No    Social History   Socioeconomic History  . Marital status: Single    Spouse name: Not on file  . Number of children: Not on file  . Years of education: Not on file  . Highest education level: Not on file  Occupational History  . Not on file  Social Needs  . Financial resource strain: Not on file  . Food insecurity:    Worry: Not on file    Inability: Not on file  . Transportation needs:    Medical: Not on file    Non-medical: Not on file  Tobacco Use  . Smoking status: Current Every Day Smoker  . Smokeless tobacco: Never Used  Substance and Sexual Activity  . Alcohol use: Yes    Alcohol/week: 6.0 standard drinks    Types: 3 Cans of beer, 3 Standard drinks or  equivalent per week  . Drug use: No  . Sexual activity: Not on file  Lifestyle  . Physical activity:    Days per week: Not on file    Minutes per session: Not on file  . Stress: Not on file  Relationships  . Social connections:    Talks on phone: Not on file    Gets together: Not on file    Attends religious service: Not on file    Active member of club or organization: Not on file    Attends meetings of clubs or organizations: Not on file    Relationship status: Not on file  Other Topics Concern  . Not on file  Social History Narrative  . Not on file   Additional Social History: He is from [place]. He moved to [place] in 2010 for a job. He lives  alone with his dog. He has never been married and does not have children. He is a Surveyor, minerals. He has a history of alcohol abuse although minimizes his use. He denies illicit substance use.     Allergies:  No Known Allergies  Labs: No results found for this or any previous visit (from the past 48 hour(s)).  Current Facility-Administered Medications  Medication Dose Route Frequency Provider Last Rate Last Dose  . acetaminophen (TYLENOL) suppository 650 mg  650 mg Rectal Q6H PRN Minor, Vilinda Blanks, NP      . acetaminophen (TYLENOL) tablet 650 mg  650 mg Oral Q6H PRN Minor, Vilinda Blanks, NP   650 mg at 09/09/18 1401  . cloNIDine (CATAPRES) tablet 0.2 mg  0.2 mg Oral TID Kathlen Mody, MD   0.2 mg at 09/20/18 0919  . feeding supplement (ENSURE ENLIVE) (ENSURE ENLIVE) liquid 237 mL  237 mL Oral BID BM Minor, Vilinda Blanks, NP   237 mL at 09/20/18 0937  . folic acid (FOLVITE) tablet 1 mg  1 mg Oral Daily Minor, Vilinda Blanks, NP   1 mg at 09/20/18 7017  . hydrALAZINE (APRESOLINE) injection 10 mg  10 mg Intravenous Q2H PRN Minor, Vilinda Blanks, NP   10 mg at 09/14/18 0417  . labetalol (NORMODYNE,TRANDATE) injection 20 mg  20 mg Intravenous Q2H PRN Minor, Vilinda Blanks, NP   20 mg at 09/15/18 7939  . levETIRAcetam (KEPPRA) tablet 1,000 mg  1,000 mg Oral BID Metzger-Cihelka, Desiree, NP   1,000 mg at 09/20/18 0919  . LORazepam (ATIVAN) injection 2-3 mg  2-3 mg Intravenous Q1H PRN Minor, Vilinda Blanks, NP   2 mg at 09/15/18 1642  . LORazepam (ATIVAN) tablet 1 mg  1 mg Oral Q8 Heywood Footman, MD      . LORazepam (ATIVAN) tablet 1 mg  1 mg Oral Q8H PRN Kathlen Mody, MD   1 mg at 09/19/18 1627  . multivitamin with minerals tablet 1 tablet  1 tablet Oral Daily Minor, Vilinda Blanks, NP   1 tablet at 09/20/18 0919  . QUEtiapine (SEROQUEL) tablet 50 mg  50 mg Oral QHS Minor, Vilinda Blanks, NP   50 mg at 09/19/18 2114  . thiamine (VITAMIN B-1) tablet 100 mg  100 mg Oral Daily Minor, Vilinda Blanks, NP   100 mg at 09/20/18 0920     Musculoskeletal: Strength & Muscle Tone: within normal limits Gait & Station: unsteady Patient leans: N/A  Psychiatric Specialty Exam: Physical Exam  Nursing note and vitals reviewed. Constitutional: He is oriented to person, place, and time. He appears well-developed and well-nourished.  HENT:  Head: Normocephalic and atraumatic.  Neck: Normal range of motion.  Respiratory: Effort normal.  Musculoskeletal: Normal range of motion.  Neurological: He is alert and oriented to person, place, and time.  Psychiatric: He has a normal mood and affect. His behavior is normal. Judgment and thought content normal.    Review of Systems  Cardiovascular: Negative for chest pain.  Gastrointestinal: Negative for abdominal pain, blood in stool, constipation, diarrhea, nausea and vomiting.  Musculoskeletal: Negative for back pain.  Psychiatric/Behavioral: Positive for substance abuse. Negative for depression, hallucinations and suicidal ideas. The patient is not nervous/anxious and does not have insomnia.   All other systems reviewed and are negative.   Blood pressure 120/86, pulse 62, temperature 98.2 F (36.8 C), temperature source Oral, resp. rate 16, height 5\' 11"  (1.803 m), weight 89.3 kg, SpO2 100 %.Body mass index is 27.46 kg/m.  General Appearance: Fairly Groomed, young, Caucasian male, wearing casual clothes who is sitting up in bed. NAD.   Eye Contact:  Good  Speech:  Clear and Coherent and Normal Rate  Volume:  Normal  Mood:  Euthymic  Affect:  Appropriate and Congruent  Thought Process:  Goal Directed, Linear and Descriptions of Associations: Intact  Orientation:  Full (Time, Place, and Person)  Thought Content:  Logical  Suicidal Thoughts:  No  Homicidal Thoughts:  No  Memory:  Immediate;   Good Recent;   Good Remote;   Good  Judgement:  Poor  Insight:  Fair  Psychomotor Activity:  Normal  Concentration:  Concentration: Good and Attention Span: Good  Recall:  Good  Fund  of Knowledge:  Good  Language:  Good  Akathisia:  No  Handed:  Right  AIMS (if indicated):   N/A  Assets:  Architect Housing Resilience Social Support  ADL's:  Intact  Cognition:  WNL  Sleep:   Okay   Assessment:  Darren Diaz is a 31 y.o. male who was admitted with witnessed seizure and fall and was found to have a small ICH. He sustained a TBI. He required Precedex for agitation secondary to DTs. BAL was 125 and UDS was negative on admission. He denies active mood symptoms. He denies SI, HI or AVH. He appears to minimize his alcohol use and denies problematic use. Consider Gabapentin for alcohol use and please have SW provide patient with resources for substance abuse treatment.  Treatment Plan Summary: -Consider Gabapentin 300 mg TID for alcohol use.  -EKG reviewed and QTc 478 on 12/8. Please closely monitor when starting or increasing QTc prolonging agents.  -Please have SW provide patient with resources for substance abuse treatment.  -Psychiatry will sign off on patient at this time. Please consult psychiatry again as needed.    Disposition: No evidence of imminent risk to self or others at present.   Patient does not meet criteria for psychiatric inpatient admission.  Darren Beach, DO 09/20/2018 12:44 PM

## 2018-09-20 NOTE — Progress Notes (Signed)
Inpatient Rehabilitation Admissions Coordinator  I met with patient and his parents at bedside. We discussed goals an expectations of an inpt rehab admit. Patient is working with therapy today and then I will return to further discuss our final recommendations for rehab venue. I discussed cost of care as patient is uninsured. I will contact financial counselor to discuss any discounts or assistance available for his medical care.  Danne Baxter, RN, MSN Rehab Admissions Coordinator [phone] 09/20/2018 10:40 AM

## 2018-09-20 NOTE — Progress Notes (Signed)
Nutrition Follow-up  DOCUMENTATION CODES:   Not applicable  INTERVENTION:  Continue Ensure Enlive po BID, each supplement provides 350 kcal and 20 grams of protein.  Continue multivitamin once daily.   Encourage adequate PO intake.   NUTRITION DIAGNOSIS:   Inadequate oral intake related to lethargy/confusion as evidenced by meal completion < 25%; improved  GOAL:   Patient will meet greater than or equal to 90% of their needs; met  MONITOR:   Supplement acceptance, PO intake, Weight trends, Labs, I & O's  REASON FOR ASSESSMENT:   Diagnosis    ASSESSMENT:   Patient with PMH significant for ETOH abuse. Presents this admission with seizures due to ETOH withdrawal, small temporal ICH/SA, and paraesthesias.   Meal completion has been 100%. Intake and appetite has been good. Pt currently has Ensure ordered and has been consuming them. RD to continue with current orders to aid in adequate caloric and protein needs. Labs and medication reviewed. Plans for possible CIR prior to discharge home.   Diet Order:   Diet Order            Diet regular Room service appropriate? Yes; Fluid consistency: Thin  Diet effective now              EDUCATION NEEDS:   Education needs have been addressed  Skin:  Skin Assessment: Reviewed RN Assessment  Last BM:  12/8  Height:   Ht Readings from Last 1 Encounters:  09/12/18 _0  (1.803 m)    Weight:   Wt Readings from Last 1 Encounters:  09/19/18 89.3 kg    Ideal Body Weight:  78.2 kg  BMI:  Body mass index is 27.46 kg/m.  Estimated Nutritional Needs:   Kcal:  2300-2500 kcal   Protein:  115-130 grams  Fluid:  >/= 2.3 L/day   Darren Parker, MS, RD, LDN Pager # [phone] After hours/ weekend pager # [phone]

## 2018-09-20 NOTE — Progress Notes (Signed)
Physical Therapy Treatment Patient Details Name: Darren Diaz MRN: [id number] DOB: 08-03-1986 Today's Date: 09/20/2018    History of Present Illness Patient is a 32 y/o male presenting to the ED on 09/08/18 due to seizures. CT of head showed a small intraparenchymal hemorrhage in the right temporal lobe and questionable small subarachnoid hemorrhage with large right parietal occipital hematoma without fracture. Intubated on 09/08/18, extubated on 08/13/18. 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    PT Comments    Patient seen for mobility progression. Improved balance this session, however does continue to demonstrate need for Min A due to scissoring gait pattern and drifting into objects in hallway. Patient with increased balance deficits with dual cognitive tasks requiring close Min A to ensure patient maintains midline. Difficulty with narrow BOS activities with increased postural sway and need for assist. Will continue to follow.     Follow Up Recommendations  CIR     Equipment Recommendations  Other (comment)(defer)    Recommendations for Other Services Rehab consult;OT consult;Speech consult     Precautions / Restrictions Precautions Precautions: Fall Restrictions Weight Bearing Restrictions: No    Mobility  Bed Mobility               General bed mobility comments: seated OOB in recliner   Transfers Overall transfer level: Needs assistance Equipment used: None Transfers: Sit to/from Stand Sit to Stand: Min assist         General transfer comment: min assist for safety and balance  Ambulation/Gait Ambulation/Gait assistance: Min assist Gait Distance (Feet): 100 Feet Assistive device: None Gait Pattern/deviations: Step-through pattern;Decreased stride length;Drifts right/left;Trunk flexed Gait velocity: decreased   General Gait Details: unsteadiness throughout with periods of scissoring gait pattern as well as drifting R/L  requiring Min A for balance and to achieve midline; increased balance deficits with cognitive tasks   Stairs             Wheelchair Mobility    Modified Rankin (Stroke Patients Only) Modified Rankin (Stroke Patients Only) Pre-Morbid Rankin Score: No symptoms Modified Rankin: Moderately severe disability     Balance Overall balance assessment: Needs assistance Sitting-balance support: No upper extremity supported;Feet supported Sitting balance-Leahy Scale: Fair     Standing balance support: No upper extremity supported;During functional activity Standing balance-Leahy Scale: Poor Standing balance comment: reliant on external support                            Cognition Arousal/Alertness: Awake/alert Behavior During Therapy: WFL for tasks assessed/performed;Impulsive;Restless Overall Cognitive Status: Impaired/Different from baseline Area of Impairment: Following commands;Safety/judgement;Problem solving;Awareness                       Following Commands: Follows one step commands consistently;Follows one step commands with increased time Safety/Judgement: Decreased awareness of safety;Decreased awareness of deficits Awareness: Emergent Problem Solving: Slow processing;Difficulty sequencing;Requires verbal cues General Comments: presents with decreased safety awareness and impaired problem sovling; noted increased time and effort for dual cog tasks       Exercises      General Comments General comments (skin integrity, edema, etc.): VSS throughout session. Max HR up to 130 with mobility;      Pertinent Vitals/Pain Pain Assessment: No/denies pain    Home Living Family/patient expects to be discharged to:: Private residence Living Arrangements: Alone Available Help at Discharge: Family;Available PRN/intermittently Type of Home: House Home Access: Stairs  to enter Entrance Stairs-Rails: Can reach both;Right;Left Home Layout: Two level Home  Equipment: None      Prior Function Level of Independence: Independent      Comments: works full time Surveyor, minerals   PT Goals (current goals can now be found in the care plan section) Acute Rehab PT Goals Patient Stated Goal: none stated PT Goal Formulation: With patient Time For Goal Achievement: 10/03/18 Potential to Achieve Goals: Good Progress towards PT goals: Progressing toward goals    Frequency    Min 4X/week      PT Plan Current plan remains appropriate    Co-evaluation PT/OT/SLP Co-Evaluation/Treatment: Yes Reason for Co-Treatment: Necessary to address cognition/behavior during functional activity;For patient/therapist safety;To address functional/ADL transfers PT goals addressed during session: Mobility/safety with mobility;Balance OT goals addressed during session: ADL's and self-care;Other (comment)(mobility)      AM-PAC PT "6 Clicks" Mobility   Outcome Measure  Help needed turning from your back to your side while in a flat bed without using bedrails?: A Little Help needed moving from lying on your back to sitting on the side of a flat bed without using bedrails?: A Little Help needed moving to and from a bed to a chair (including a wheelchair)?: A Little Help needed standing up from a chair using your arms (e.g., wheelchair or bedside chair)?: A Little Help needed to walk in hospital room?: A Little Help needed climbing 3-5 steps with a railing? : A Lot 6 Click Score: 17    End of Session Equipment Utilized During Treatment: Gait belt Activity Tolerance: Patient tolerated treatment well Patient left: in chair;with call bell/phone within reach;with chair alarm set Nurse Communication: Mobility status PT Visit Diagnosis: Unsteadiness on feet (R26.81);Other abnormalities of gait and mobility (R26.89);Muscle weakness (generalized) (M62.81)     Time: 7253-6644 PT Time Calculation (min) (ACUTE ONLY): 17 min  Charges:  $Gait Training: 8-22  mins                     Kipp Laurence, PT, DPT Supplemental Physical Therapist 09/20/18 10:08 AM Pager: [phone] Office: [phone]

## 2018-09-20 NOTE — Evaluation (Signed)
Speech Language Pathology Evaluation Patient Details Name: Darren Diaz MRN: [id number] DOB: November 30, 1985 Today's Date: 09/20/2018 Time: 7564-3329 SLP Time Calculation (min) (ACUTE ONLY): 15 min  Problem List:  Patient Active Problem List   Diagnosis Date Noted  . Alcohol abuse   . Status epilepticus (HCC)   . TBI (traumatic brain injury) (HCC)   . Tobacco abuse   . Acute blood loss anemia   . Seizures (HCC) 09/08/2018  . Acute respiratory failure with hypoxia (HCC)   . 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 Thrombocytopenia (HCC)    Past Medical History:  Past Medical History:  Diagnosis Date  . Seizures (HCC)    Past Surgical History:  Past Surgical History:  Procedure Laterality Date  . 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 Acute brain finding of abnormal subarachnoid fluid or blood in the right temporal lobe. Negative for acute infarct or cerebral edema. Generalized cerebral volume loss for age, which can be seen in the setting of chronic systemic diseases such as HIV.   Assessment / Plan / Recommendation Clinical Impression  Pt presents with functional cognitive abilities as evidenced by score of 29 out of 30 on MOCA version 7.1 (n=> 26). Pt was appropriate when interacting with SLP and demonstrated awareness of difficulty sequencing gait (as he was a fast walker previously). Pt is recommending CIR follow up and provided that pt had difficulty performing dual attention tasks during movement. ST to sign of acutely with hopeful plan to CIR and further higher level cognitive assessment at that venue of care.     SLP Assessment  SLP Recommendation/Assessment: All further Speech Lanaguage Pathology  needs can be addressed in the next venue of care SLP Visit Diagnosis: Cognitive  communication deficit (R41.841)    Follow Up Recommendations  Inpatient Rehab          SLP Evaluation Cognition  Overall Cognitive Status: Within Functional Limits for tasks assessed Arousal/Alertness: Awake/alert Orientation Level: Oriented X4       Comprehension  Auditory Comprehension Overall Auditory Comprehension: Appears within functional limits for tasks assessed Visual Recognition/Discrimination Discrimination: Within Function Limits Reading Comprehension Reading Status: Within funtional limits    Expression Expression Primary Mode of Expression: Verbal Verbal Expression Overall Verbal Expression: Appears within functional limits for tasks assessed Written Expression Dominant Hand: Right Written Expression: Within Functional Limits   Oral / Motor  Oral Motor/Sensory Function Overall Oral Motor/Sensory Function: Within functional limits Motor Speech Overall Motor Speech: Appears within functional limits for tasks assessed Respiration: Within functional limits   GO                    Darren Diaz 09/20/2018, 11:31 AM

## 2018-09-20 NOTE — Evaluation (Signed)
Occupational Therapy Evaluation Patient Details Name: Darren Diaz MRN: [id number] DOB: 01-09-1986 Today's Date: 09/20/2018    History of Present Illness Patient is a 32 y/o male presenting to the ED on 09/08/18 due to seizures. CT of head showed a small intraparenchymal hemorrhage in the right temporal lobe and questionable small subarachnoid hemorrhage with large right parietal occipital hematoma without fracture. Intubated on 09/08/18, extubated on 08/13/18. PMH significant for PDA with closure at the age of 53 and EtOH use.   Clinical Impression   PTA patient independent and working.  Currently admitted for above and limited by problem list below, including impaired balance, decreased safety awareness and poor problem solving.  Patient requires min assist for transfers, min guard for standing ADLs (toileting, grooming) and min assist for LB ADLs.  Cognitively, he is oriented and able to follow commands but noted difficulty with dual cognitive tasks and safety. He will benefit from continued OT services while admitted and believe he will best benefit from intensive CIR level rehab at dc in order to optimize independence/safety with ADLs and mobility.      Follow Up Recommendations  CIR    Equipment Recommendations  3 in 1 bedside commode    Recommendations for Other Services Rehab consult     Precautions / Restrictions Precautions Precautions: Fall Restrictions Weight Bearing Restrictions: No      Mobility Bed Mobility               General bed mobility comments: seated OOB in recliner   Transfers Overall transfer level: Needs assistance Equipment used: None Transfers: Sit to/from Stand Sit to Stand: Min assist         General transfer comment: min assist for safety and balance    Balance Overall balance assessment: Needs assistance Sitting-balance support: No upper extremity supported;Feet supported Sitting balance-Leahy Scale: Fair     Standing balance  support: No upper extremity supported;During functional activity Standing balance-Leahy Scale: Poor Standing balance comment: reliant on external support                           ADL either performed or assessed with clinical judgement   ADL Overall ADL's : Needs assistance/impaired     Grooming: Min guard;Standing   Upper Body Bathing: Set up;Sitting   Lower Body Bathing: Minimal assistance;Sit to/from stand   Upper Body Dressing : Set up;Sitting   Lower Body Dressing: Minimal assistance;Sit to/from stand   Toilet Transfer: Ambulation;Minimal Dentist Details (indicate cue type and reason): simulated to recliner  Toileting- Clothing Manipulation and Hygiene: Min guard;Sit to/from stand       Functional mobility during ADLs: Minimal assistance;Cueing for safety General ADL Comments: patient limited by impaired balance, safety and problem sovling      Vision Baseline Vision/History: Wears glasses Wears Glasses: Distance only(at night) Patient Visual Report: No change from baseline Vision Assessment?: Yes Eye Alignment: Within Functional Limits Ocular Range of Motion: Within Functional Limits Alignment/Gaze Preference: Within Defined Limits Tracking/Visual Pursuits: Able to track stimulus in all quads without difficulty;Other (comment)(decreased smoothness in all planes) Visual Fields: No apparent deficits     Perception     Praxis      Pertinent Vitals/Pain Pain Assessment: No/denies pain     Hand Dominance Right   Extremity/Trunk Assessment Upper Extremity Assessment Upper Extremity Assessment: RUE deficits/detail;LUE deficits/detail RUE Deficits / Details: grossly 4+/5, noted slight tremors and decreased coordination functionally  LUE Deficits / Details: grossly  4+/5, noted slight tremors and decreased coordination functionally    Lower Extremity Assessment Lower Extremity Assessment: Defer to PT evaluation   Cervical / Trunk  Assessment Cervical / Trunk Assessment: Normal   Communication Communication Communication: No difficulties   Cognition Arousal/Alertness: Awake/alert Behavior During Therapy: WFL for tasks assessed/performed;Impulsive;Restless Overall Cognitive Status: Impaired/Different from baseline Area of Impairment: Following commands;Safety/judgement;Problem solving;Awareness                       Following Commands: Follows one step commands consistently;Follows one step commands with increased time Safety/Judgement: Decreased awareness of safety;Decreased awareness of deficits Awareness: Emergent Problem Solving: Slow processing;Difficulty sequencing;Requires verbal cues General Comments: presents with decreased safety awareness and impaired problem sovling; noted increased time and effort for dual cog tasks    General Comments  VSS throughout session    Exercises     Shoulder Instructions      Home Living Family/patient expects to be discharged to:: Private residence Living Arrangements: Alone Available Help at Discharge: Family;Available PRN/intermittently Type of Home: House Home Access: Stairs to enter [employer] of Steps: 3 Entrance Stairs-Rails: Can reach both;Right;Left Home Layout: Two level Alternate Level Stairs-Number of Steps: 14 Alternate Level Stairs-Rails: Left Bathroom Shower/Tub: Tub/shower unit;Walk-in shower(typically uses walk in shower )   Bathroom Toilet: Standard     Home Equipment: None          Prior Functioning/Environment Level of Independence: Independent        Comments: works full time [employer]or, [employer]        OT Problem List: Decreased activity tolerance;Impaired balance (sitting and/or standing);Decreased safety awareness;Decreased cognition;Decreased knowledge of use of DME or AE;Decreased knowledge of precautions;Decreased coordination      OT Treatment/Interventions: Self-care/ADL training;Energy  conservation;DME and/or AE instruction;Therapeutic activities;Cognitive remediation/compensation;Patient/family education;Balance training;Therapeutic exercise    OT Goals(Current goals can be found in the care plan section) Acute Rehab OT Goals Patient Stated Goal: none stated OT Goal Formulation: With patient Time For Goal Achievement: 10/04/18 Potential to Achieve Goals: Good  OT Frequency: Min 2X/week   Barriers to D/C:            Co-evaluation PT/OT/SLP Co-Evaluation/Treatment: Yes Reason for Co-Treatment: Necessary to address cognition/behavior during functional activity;To address functional/ADL transfers;For patient/therapist safety   OT goals addressed during session: ADL's and self-care;Other (comment)(mobility)      AM-PAC OT "6 Clicks" Daily Activity     Outcome Measure Help from another person eating meals?: None Help from another person taking care of personal grooming?: A Little Help from another person toileting, which includes using toliet, bedpan, or urinal?: A Little Help from another person bathing (including washing, rinsing, drying)?: A Little Help from another person to put on and taking off regular upper body clothing?: None Help from another person to put on and taking off regular lower body clothing?: A Little 6 Click Score: 20   End of Session Equipment Utilized During Treatment: Gait belt Nurse Communication: Mobility status  Activity Tolerance: Patient tolerated treatment well Patient left: in chair;with call bell/phone within reach;with chair alarm set  OT Visit Diagnosis: Other abnormalities of gait and mobility (R26.89);Unsteadiness on feet (R26.81);Other symptoms and signs involving cognitive function                Time: 4098-1191 OT Time Calculation (min): 17 min Charges:  OT General Charges $OT Visit: 1 Visit OT Evaluation $OT Eval Moderate Complexity: 1 Mod  Chancy Milroy, OT Acute Herbalist  [phone] Office [phone]   Chancy Milroy 09/20/2018, 9:12 AM

## 2018-09-20 NOTE — Progress Notes (Signed)
Inpatient Rehabilitation Admissions Coordinator  I met with patient at bedside alone except for tele sitter. Parents not present. I discussed that PT, OT, and SLP continue to feel he could benefit from an inpt rehab admission prior to discharge home. He states the cost of care estimate limits him in agreeing to admit. I have contacted financial counselor, Janett Billow, to discuss with patient. Noted that psych consult has been ordered.  Danne Baxter, RN, MSN Rehab Admissions Coordinator [phone] 09/20/2018 12:47 PM

## 2018-09-21 MED ORDER — QUETIAPINE FUMARATE 50 MG PO TABS: 50.0000 mg | ORAL_TABLET | Freq: Every day | ORAL | 0 refills | Status: DC

## 2018-09-21 MED ORDER — LEVETIRACETAM 1000 MG PO TABS: 1000.0000 mg | ORAL_TABLET | Freq: Two times a day (BID) | ORAL | 0 refills | Status: DC

## 2018-09-21 MED ORDER — GABAPENTIN 600 MG PO TABS: 300.0000 mg | ORAL_TABLET | Freq: Two times a day (BID) | ORAL | 0 refills | Status: DC

## 2018-09-21 MED ORDER — ENSURE ENLIVE PO LIQD: 237.0000 mL | Freq: Two times a day (BID) | ORAL | 0 refills | Status: DC

## 2018-09-21 MED ORDER — FOLIC ACID 1 MG PO TABS: 1.0000 mg | ORAL_TABLET | Freq: Every day | ORAL | Status: DC

## 2018-09-21 MED ORDER — ADULT MULTIVITAMIN W/MINERALS CH: 1.0000 | ORAL_TABLET | Freq: Every day | ORAL | Status: DC

## 2018-09-21 MED ORDER — CLONIDINE HCL 0.1 MG PO TABS: ORAL_TABLET | ORAL | 0 refills | Status: DC

## 2018-09-21 MED ORDER — THIAMINE HCL 100 MG PO TABS: 100.0000 mg | ORAL_TABLET | Freq: Every day | ORAL | Status: DC

## 2018-09-21 NOTE — Care Management Note (Addendum)
Case Management Note  Patient Details  Name: Darren Diaz MRN: [id number] Date of Birth: 09/19/1986  Subjective/Objective:                    Action/Plan: Pt discharging home with self care. CM consulted for outpatient therapy. CM met with the patient and he would like to attend Outpatient on [facility]. Orders in Epic and information on the AVS.  Pt without insurance and PCP. CM was able to obtain him an appt at [facility]. Information on the AVS.  CM encouraged him to use [facility] after the first 30 days for his mediations. Information on the AVS. CM provided pt MATCH letter to assist with the cost of his medications, and a list of pharmacies that accept the [facility].  Pt refused ordered 3 in 1. Pt's parents to provide supervision at home and transportation to home.   Expected Discharge Date:                  Expected Discharge Plan:  OP Rehab  In-House Referral:  Clinical Social Work  Discharge planning Services  CM Consult, [facility], [facility]  Post Acute Care Choice:    Choice offered to:     DME Arranged:    DME Agency:     HH Arranged:    [place] Agency:     Status of Service:  Completed, signed off  If discussed at [employer], dates discussed:    Additional Comments:  Pollie Friar, RN 09/21/2018, 4:17 PM

## 2018-09-21 NOTE — Progress Notes (Signed)
CSW met with patient to discuss substance abuse resources. Patient says he's been involved with AA before, and knows how to find that information online. CSW discussed other resources available, including counseling services and group therapy resources. Patient discussed how he wants to get better so he would look into options available.   CSW signing off.  Laveda Abbe, [place] Clinical Social Worker [phone]

## 2018-09-21 NOTE — Progress Notes (Signed)
Pt discharge education and instructions completed with pt at bedside; both discharge home with family to transport to disposition. Pt IV and telemetry removed; pt handed his prescriptions as ordered by MD. Pt offered wheelchair but declines and ambulated off unit with RN to the side. Dionne Bucy RN

## 2018-09-21 NOTE — Progress Notes (Signed)
Physical Therapy Treatment Patient Details Name: Darren Diaz MRN: [id number] DOB: November 30, 1985 Today's Date: 09/21/2018    History of Present Illness Patient is a 31 y/o male presenting to the ED on 09/08/18 due to seizures. CT of head showed a small intraparenchymal hemorrhage in the right temporal lobe and questionable small subarachnoid hemorrhage with large right parietal occipital hematoma without fracture. Intubated on 09/08/18, extubated on 08/13/18. PMH significant for PDA with closure at the age of 6 and EtOH use.    PT Comments    Pt making great progress towards achieving his current functional mobility goals. Updated recommendations to reflect. Pt would benefit from OP PT for higher level balance. Pt would continue to benefit from skilled physical therapy services at this time while admitted and after d/c to address the below listed limitations in order to improve overall safety and independence with functional mobility.    Follow Up Recommendations  Outpatient PT     Equipment Recommendations  None recommended by PT    Recommendations for Other Services       Precautions / Restrictions Precautions Precautions: Fall Restrictions Weight Bearing Restrictions: No    Mobility  Bed Mobility Overal bed mobility: Modified Independent                Transfers Overall transfer level: Needs assistance Equipment used: None Transfers: Sit to/from Stand Sit to Stand: Supervision         General transfer comment: supervision for safety  Ambulation/Gait Ambulation/Gait assistance: Min guard Gait Distance (Feet): 200 Feet Assistive device: None Gait Pattern/deviations: Step-through pattern;Decreased stride length;Drifts right/left Gait velocity: decreased   General Gait Details: pt with mild instability and minor LOB x2 which he was able to recover independently using a stepping strategy, min guard overall for safety   Stairs Stairs: Yes Stairs assistance:  Supervision Stair Management: Two rails;Alternating pattern;Forwards Number of Stairs: 5(5 steps x2 trials)     Wheelchair Mobility    Modified Rankin (Stroke Patients Only)       Balance Overall balance assessment: Needs assistance Sitting-balance support: Feet supported Sitting balance-Leahy Scale: Good     Standing balance support: During functional activity;No upper extremity supported Standing balance-Leahy Scale: Fair                              Cognition Arousal/Alertness: Awake/alert Behavior During Therapy: WFL for tasks assessed/performed Overall Cognitive Status: Within Functional Limits for tasks assessed                                        Exercises      General Comments        Pertinent Vitals/Pain Pain Assessment: No/denies pain    Home Living                      Prior Function            PT Goals (current goals can now be found in the care plan section) Acute Rehab PT Goals PT Goal Formulation: With patient Time For Goal Achievement: 10/03/18 Potential to Achieve Goals: Good Progress towards PT goals: Progressing toward goals    Frequency    Min 3X/week      PT Plan Discharge plan needs to be updated;Frequency needs to be updated    Co-evaluation  AM-PAC PT "6 Clicks" Mobility   Outcome Measure  Help needed turning from your back to your side while in a flat bed without using bedrails?: None Help needed moving from lying on your back to sitting on the side of a flat bed without using bedrails?: None Help needed moving to and from a bed to a chair (including a wheelchair)?: None Help needed standing up from a chair using your arms (e.g., wheelchair or bedside chair)?: None Help needed to walk in hospital room?: A Little Help needed climbing 3-5 steps with a railing? : A Little 6 Click Score: 22    End of Session   Activity Tolerance: Patient tolerated treatment  well Patient left: in bed;with call bell/phone within reach;with bed alarm set Nurse Communication: Mobility status PT Visit Diagnosis: Unsteadiness on feet (R26.81);Other abnormalities of gait and mobility (R26.89);Muscle weakness (generalized) (M62.81)     Time: 4098-1191 PT Time Calculation (min) (ACUTE ONLY): 15 min  Charges:  $Gait Training: 8-22 mins                     Deborah Chalk, [place], DPT  Acute Rehabilitation Services Pager [phone] Office [phone]     Alessandra Bevels Sharion Grieves 09/21/2018, 4:17 PM

## 2018-09-21 NOTE — Discharge Instructions (Signed)
Darren Diaz,  You were here because of a seizure and small brain bleed/bruise. You also had aochol withdrawal. Please follow-up at a primary care physician's office in addition to the neurologist's office.

## 2018-09-21 NOTE — Progress Notes (Signed)
Pt refusing cardiac monitoring. Darren Diaz

## 2018-09-21 NOTE — Progress Notes (Signed)
Inpatient Rehabilitation Admissions Coordinator  I met with patient at bedside with his tele sitter. He is requesting discharge home. I advised patient to discharge appropriately with MD and not AMA. He is in agreement. No family present. I have notified RN CM and SW. We will sign off at this time. Noted psych consult yesterday.  Danne Baxter, RN, MSN Rehab Admissions Coordinator [phone] 09/21/2018 11:49 AM

## 2018-09-23 NOTE — Discharge Summary (Signed)
Physician Discharge Summary  Darren Diaz ZOX:[id number] DOB: Oct 25, 1985 DOA: 09/08/2018  PCP: Patient, No Pcp Per  Admit date: 09/08/2018 Discharge date: 09/23/2018  Admitted From: Home Disposition: Home  Recommendations for Outpatient Follow-up:  1. Follow up with PCP in 1 week 2. Neurology follow-up 3. Please obtain BMP/CBC in one week 4. Please follow up on the following pending results: None  Home Health: Outpatient PT Equipment/Devices: None  Discharge Condition: Stable CODE STATUS: Full code Diet recommendation: Heart healthy   Brief/Interim Summary:  Admission HPI written by Reyes Ivan, MD   History of present illness   HPI obtained from medical chart review and per parents at bedside as patient is intubated and sedated on mechanical ventilation.  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  Parents are visiting from out of town since yesterday.  Parents were having a "discussion" with him tonight over his alcohol use.  Known EtOH use is several shots, 2-4 of liquor and beer and their presence.  Last use last evening, but parents think patient has cut back on his usage since their arrival. Patient walked into the kitchen and mother noted his eyes rolled back and patient fell from standing position to the floor.  Mother states he hit his head hard and then had apparent seizure lasting from 4 to 10 minutes per parents.   On arrival to ER patient was postictal GCS improved 14 in ER.  Labs noted for EtOH of 125, K3.3, AG 20, AST 198, ALT 89, platelet 61.  CT of head showed a small intraparenchymal hemorrhage in the right temporal lobe and questionable small subarachnoid hemorrhage with large right parietal occipital hematoma without fracture.  While in ER patient had another witnessed seizure  and was combative postictally requiring intubation for airway protection.  Patient sedated on propofol and loaded with Keppra.  Neurology consulted.  PCCM to admit to ICU.   Hospital course:  Alcohol withdrawal Delirium tremens Patient was treated with Precedex which was weaned off. Started on clonidine with a taper. Thiamine, multivitamin and folate. Social work consulted and provided resources.  Hypokalemia Hypomagnesemia Supplementation given  LE neuropathy Outpatient follow-up with neurology  Seizures Secondary to subarachnoid hemorrhage/alcohol withdrawal. Started on Keppra 1000 mg (increased from 500 mg) BID.  Unsteadiness Patient declining placement. Home with outpatient PT.  Macrocytic anemia In setting of alcohol use. Stable.  Prolonged QTc Magnesium and potassium given.  Orthostatic hypotension Improved.  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  Will need NCV/EMG   Ambulatory referral to Physical Therapy   Complete by:  As directed    Diet - low sodium heart healthy   Complete by:  As directed    Increase activity slowly   Complete by:  As directed      Allergies as of 09/21/2018   No  Known Allergies     Medication List    TAKE these medications   cloNIDine 0.1 MG tablet Commonly known as:  CATAPRES Take 1 tablet (0.1 mg total) by mouth 3 (three) times daily for 1 day, THEN 1 tablet (0.1 mg total) 2 (two) times daily for 1 day, THEN 1 tablet (0.1 mg total) daily for 1  day. Start taking on:  September 21, 2018 Notes to patient:  Take med 2 times (morning and evening) 12/13 x1 on 12/14 (morning)   feeding supplement (ENSURE ENLIVE) Liqd Take 237 mLs by mouth 2 (two) times daily between meals.   folic acid 1 MG tablet Commonly known as:  FOLVITE Take 1 tablet (1 mg total) by mouth daily.   gabapentin 600 MG tablet Commonly known as:  NEURONTIN Take 0.5 tablets (300 mg total) by mouth 2 (two) times daily.   levETIRAcetam 1000 MG tablet Commonly known as:  KEPPRA Take 1 tablet (1,000 mg total) by mouth 2 (two) times daily.   multivitamin with minerals Tabs tablet Take 1 tablet by mouth daily.   pyridOXINE 100 MG tablet Commonly known as:  VITAMIN B-6 Take 100 mg by mouth daily.   QUEtiapine 50 MG tablet Commonly known as:  SEROQUEL Take 1 tablet (50 mg total) by mouth at bedtime.   thiamine 100 MG tablet Take 1 tablet (100 mg total) by mouth daily.      Follow-up Information    [facility] Follow up on 10/03/2018.   Why:  your appointment is at 1 pm. Please arrive 30 min early and bring ID and current medications.  Contact information: [phone]   [address] [place], [place] [address]       [facility] Follow up.   Specialty:  Rehabilitation Why:  They will contact you for the first appointment Contact information: [address] [id number] mc [place] [address] [phone]       [facility] Follow up.   Why:  Use this location for your pharmacy needs. Provides discounted medications Contact information: [address] [place] [address] [phone]       [facility]. Schedule an appointment as soon as possible for a visit in 4 day(s).   Specialty:  Neurology Why:  Seizures Contact information: [address] [place] [address] [phone]          No Known Allergies  Consultations:  PCCM   Procedures/Studies: Ct Head Wo Contrast  Result Date: 09/11/2018 CLINICAL DATA:  Follow-up examination for acute subarachnoid hemorrhage. EXAM: CT HEAD WITHOUT CONTRAST TECHNIQUE: Contiguous axial images were obtained from the base of the skull through the vertex without intravenous contrast. COMPARISON:  Prior CT from 09/09/2018 FINDINGS: Brain: Previously seen right subarachnoid hemorrhage slightly decreased as compared to previous exam. No new intracranial hemorrhage. No acute large vessel territory infarct. No mass lesion, midline shift or mass effect. No hydrocephalus. No extra-axial fluid collection. Vascular: No hyperdense vessel. Skull: Evolving multifocal scalp contusions noted. Calvarium intact. Sinuses/Orbits: Globes orbital soft tissues demonstrate no acute finding. Scattered mucosal thickening with layering opacity within the paranasal sinuses, similar to previous. Left mastoid effusions slightly increased. Other: None. IMPRESSION: 1. Slight interval decrease in small volume right subarachnoid hemorrhage. No other new acute intracranial abnormality. 2. Evolving multifocal scalp contusions. 3. Similar paranasal sinus disease with increasing left mastoid effusion. Electronically Signed   By: Rise Mu M.D.   On:  09/11/2018 23:33   Ct Head Wo Contrast  Result Date: 09/09/2018 CLINICAL DATA:  Head trauma, ataxia EXAM: CT HEAD WITHOUT CONTRAST TECHNIQUE: Contiguous axial images were obtained from the base of the skull through the vertex without intravenous contrast. COMPARISON:  CT head 09/08/2018 FINDINGS: Brain: Subarachnoid hemorrhage right temporal lobe similar to the prior study. Negative for subdural hemorrhage. Generalized atrophy. Negative for acute infarct or mass. No midline shift. Vascular: Negative for hyperdense vessel Skull: Negative for fracture Sinuses/Orbits: Air-fluid levels in the sphenoid and maxillary sinuses  bilaterally, new since the recent CT. No sinus fracture identified. Mucosal edema throughout the ethmoid and maxillary sinus bilaterally. Other: Right parietal soft tissue swelling similar to the prior study compatible with contusion. IMPRESSION: Mild subarachnoid hemorrhage right temporal lobe unchanged. No subdural hemorrhage or midline shift. Negative for acute infarct. Electronically Signed   By: Marlan Palau M.D.   On: 09/09/2018 14:59   Ct Head Wo Contrast  Result Date: 09/08/2018 CLINICAL DATA:  New onset seizure. EXAM: CT HEAD WITHOUT CONTRAST TECHNIQUE: Contiguous axial images were obtained from the base of the skull through the vertex without intravenous contrast. COMPARISON:  None. FINDINGS: Brain: Small volume intraparenchymal hemorrhage in the right temporal lobe. Possible small adjacent subarachnoid hemorrhage versus motion. No midline shift or mass effect. No hydrocephalus, the basilar cisterns are patent. Vascular: No hyperdense vessel. Skull: No fracture or focal lesion. Sinuses/Orbits: Mucosal thickening of both maxillary sinuses. Mastoid air cells are clear. Other: Right parietal-occipital scalp hematoma. IMPRESSION: 1. Small volume intraparenchymal hemorrhage in the right temporal lobe. Possible small adjacent subarachnoid hemorrhage versus motion artifact. 2. Right parietal-occipital scalp hematoma without skull fracture. Critical Value/emergent results were called by telephone at the time of interpretation on 09/08/2018 at 5:16 am to PA [facility] , who verbally acknowledged these results. Electronically Signed   By: Narda Rutherford M.D.   On: 09/08/2018 05:16   Mr Brain Wo Contrast  Addendum Date: 09/08/2018   ADDENDUM REPORT: 09/08/2018 11:54 ADDENDUM: I have now become aware that the patient already underwent noncontrast head CT earlier today (report but no images currently available in [facility], probably due to an unscheduled IT downtime). The report describes a small  right temporal lobe hemorrhage/hemorrhagic contusion with possible SAH. CONCLUSION: Sequelae of trauma with small volume right temporal lobe hemorrhage strongly suspected to explain the abnormal subarachnoid FLAIR signal in #1. Electronically Signed   By: Odessa Fleming M.D.   On: 09/08/2018 11:54   Result Date: 09/08/2018 CLINICAL DATA:  32 year old male with unexplained altered mental status, witnessed seizure, recent falls, had been complaining of extremity numbness. Intubated. EXAM: MRI HEAD WITHOUT CONTRAST TECHNIQUE: Multiplanar, multiecho pulse sequences of the brain and surrounding structures were obtained without intravenous contrast. COMPARISON:  None. FINDINGS: Brain: No restricted diffusion or evidence of acute infarction. Abnormal FLAIR hyperintensity in the sulci of the right temporal lobe (series 11, image 11 and series 12, image 38). No intraventricular or other extra-axial hemorrhage identified. The ventricles appear clear, no intraventricular debris is identified. On sagittal series 12, image 89 there is questionable abnormal CSF in some of the basilar cisterns, but I favor this is artifact, as it is not correlated on the remaining sequences. Underlying age advanced generalized cerebral volume loss. No focal encephalomalacia, but there might be occasional chronic micro hemorrhages in the brain such as in the left inferior frontal gyrus on series 13, image 29. Generally normal signal in the cerebral white matter, deep gray matter nuclei, brainstem, and cerebellum.  Coronal thin slice imaging demonstrates symmetric and normal appearing hippocampal formations. No midline shift, mass effect, evidence of mass lesion, ventriculomegaly. Cervicomedullary junction and pituitary are within normal limits. Vascular: Major intracranial vascular flow voids are preserved. Skull and upper cervical spine: Negative visible cervical spine and spinal cord. Visualized bone marrow signal is within normal limits.  Sinuses/Orbits: Negative orbits. Mild paranasal sinus mucosal thickening. Other: The patient is intubated with fluid in the pharynx. Trace left mastoid fluid. Visible internal auditory structures appear negative. Relatively large and broad-based right posterior convexity scalp hematoma measuring up to 16 millimeters in thickness. IMPRESSION: 1. Acute brain finding of abnormal subarachnoid fluid or blood in the right temporal lobe. This might be trace subarachnoid hemorrhage (posttraumatic) with differential diagnosis including proteinaceous CSF such as due to meningitis. No ventricular debris. Follow-up noncontrast Head CT for correlation would be valuable. 2. Negative for acute infarct or cerebral edema. 3. Generalized cerebral volume loss for age, which can be seen in the setting of chronic systemic diseases such as HIV. 4. Broad-based right posterior scalp hematoma. Electronically Signed: By: Odessa Fleming M.D. On: 09/08/2018 11:23   Mr Laqueta Jean ZO Contrast  Result Date: 09/18/2018 CLINICAL DATA:  Followup subarachnoid hemorrhage on the right. EXAM: MRI HEAD WITHOUT AND WITH CONTRAST TECHNIQUE: Multiplanar, multiecho pulse sequences of the brain and surrounding structures were obtained without and with intravenous contrast. CONTRAST:  8 cc Gadavist COMPARISON:  Multiple prior CT studies most recently 09/11/2018. MRI 09/08/2018. FINDINGS: Brain: Diffusion imaging does not show any acute or subacute infarction. The brainstem and cerebellum are normal. Cerebral hemispheres are normal without evidence of old small or large vessel infarction. There has been resolution of the previously seen subarachnoid hemorrhage in the right temporal and sylvian region. No evidence of residual signal abnormality or hemosiderin deposition. No mass lesion, hydrocephalus or extra-axial collection. No abnormal contrast enhancement. Vascular: Major vessels at the base of the brain show flow. Skull and upper cervical spine: Negative  Sinuses/Orbits: Paranasal sinuses are clear. Mastoid effusion present on the left, not noted previously. Other: Resolving scalp hematoma in the right parietal region. IMPRESSION: Brain parenchyma appears normal today. No evidence of residual intracranial hemorrhage. No sign that there was an intraparenchymal component. Left mastoid effusion, not seen previously. Resolving right parietal scalp hematoma and edema. Electronically Signed   By: Paulina Fusi M.D.   On: 09/18/2018 12:16   Mr Cervical Spine Wo Contrast  Result Date: 09/08/2018 CLINICAL DATA:  Foot numbness for several weeks with increasing altered mental status over the last day. Patient fell yesterday with seizure activity. Now intubated. EXAM: MRI CERVICAL AND THORACIC SPINE WITHOUT CONTRAST TECHNIQUE: Multiplanar and multiecho pulse sequences of the cervical spine, to include the craniocervical junction and cervicothoracic junction, and the thoracic spine, were obtained without intravenous contrast. COMPARISON:  Portable chest today.  Abdominopelvic CT 07/09/2014. FINDINGS: MRI CERVICAL SPINE FINDINGS Alignment: Normal. Vertebrae: No evidence of acute fracture or traumatic subluxation. No focal osseous lesions. Cord: Normal in signal and caliber.  No evidence of cord hemorrhage. Posterior Fossa, vertebral arteries, paraspinal tissues: The visualized posterior fossa appears normal. There are normal vertebral artery flow voids bilaterally. Patient is intubated, and an enteric tube is in place. There is fluid within the nasopharyngeal airway. Disc levels: No disc herniation, spinal stenosis or nerve root encroachment. MRI THORACIC SPINE FINDINGS Alignment:  Normal. Vertebrae: No evidence of acute fracture or traumatic subluxation. No focal osseous abnormality. Cord: Normal in signal and caliber. The conus medullaris extends  to the L1 level. Paraspinal and other soft tissues: No paraspinal abnormality. There is dependent atelectasis at both lung bases.  Enteric tube extends below the diaphragm. 7 mm subcutaneous cyst off midline to the right at T7, consistent with an epidermal inclusion (sebaceous) cyst. Disc levels: There is a small left paracentral disc protrusion at T6-7 without cord deformity or foraminal compromise. The additional disc space levels appear normal. There is no nerve root encroachment. There is no evidence of discitis or osteomyelitis. IMPRESSION: 1. No acute or significant findings within the cervicothoracic spine. 2. Small left paracentral disc protrusion at T6-7 without cord deformity or foraminal compromise. 3. No abnormal cord signal or paraspinal findings. 4. Fluid in the nasopharyngeal airway attributed to intubation. Bibasilar pulmonary atelectasis. Electronically Signed   By: Carey Bullocks M.D.   On: 09/08/2018 12:17   Mr Thoracic Spine Wo Contrast  Result Date: 09/08/2018 CLINICAL DATA:  Foot numbness for several weeks with increasing altered mental status over the last day. Patient fell yesterday with seizure activity. Now intubated. EXAM: MRI CERVICAL AND THORACIC SPINE WITHOUT CONTRAST TECHNIQUE: Multiplanar and multiecho pulse sequences of the cervical spine, to include the craniocervical junction and cervicothoracic junction, and the thoracic spine, were obtained without intravenous contrast. COMPARISON:  Portable chest today.  Abdominopelvic CT 07/09/2014. FINDINGS: MRI CERVICAL SPINE FINDINGS Alignment: Normal. Vertebrae: No evidence of acute fracture or traumatic subluxation. No focal osseous lesions. Cord: Normal in signal and caliber.  No evidence of cord hemorrhage. Posterior Fossa, vertebral arteries, paraspinal tissues: The visualized posterior fossa appears normal. There are normal vertebral artery flow voids bilaterally. Patient is intubated, and an enteric tube is in place. There is fluid within the nasopharyngeal airway. Disc levels: No disc herniation, spinal stenosis or nerve root encroachment. MRI THORACIC  SPINE FINDINGS Alignment:  Normal. Vertebrae: No evidence of acute fracture or traumatic subluxation. No focal osseous abnormality. Cord: Normal in signal and caliber. The conus medullaris extends to the L1 level. Paraspinal and other soft tissues: No paraspinal abnormality. There is dependent atelectasis at both lung bases. Enteric tube extends below the diaphragm. 7 mm subcutaneous cyst off midline to the right at T7, consistent with an epidermal inclusion (sebaceous) cyst. Disc levels: There is a small left paracentral disc protrusion at T6-7 without cord deformity or foraminal compromise. The additional disc space levels appear normal. There is no nerve root encroachment. There is no evidence of discitis or osteomyelitis. IMPRESSION: 1. No acute or significant findings within the cervicothoracic spine. 2. Small left paracentral disc protrusion at T6-7 without cord deformity or foraminal compromise. 3. No abnormal cord signal or paraspinal findings. 4. Fluid in the nasopharyngeal airway attributed to intubation. Bibasilar pulmonary atelectasis. Electronically Signed   By: Carey Bullocks M.D.   On: 09/08/2018 12:17   Dg Chest Port 1 View  Result Date: 09/09/2018 CLINICAL DATA:  Fever and chills.  Hypoxia. EXAM: PORTABLE CHEST 1 VIEW COMPARISON:  September 08, 2018 FINDINGS: Endotracheal tube tip is 10.2 cm above the carina. Nasogastric tube tip and side port are below the diaphragm. No pneumothorax. There is a minimal right pleural effusion. The lungs elsewhere are clear. Heart is upper normal in size with pulmonary vascularity normal. No adenopathy. No bone lesions. IMPRESSION: Tube positions as described without pneumothorax. Note that the endotracheal tube tip is approximately 10 cm above the carina. It may be prudent to consider advancing endotracheal tube approximately 5-6 cm. Minimal right pleural effusion. No edema or consolidation. Stable cardiac silhouette. Electronically Signed  By: Bretta Bang III M.D.   On: 09/09/2018 07:10   Dg Chest Portable 1 View  Result Date: 09/08/2018 CLINICAL DATA:  Initial evaluation for endotracheal tube placement. EXAM: PORTABLE CHEST 1 VIEW COMPARISON:  None available. FINDINGS: Endotracheal tube in place with tip position approximately 4.6 cm above the carina. Enteric tube courses into the abdomen. Cardiac and mediastinal silhouettes within normal limits. Lungs hypoinflated. No focal infiltrates. No edema or effusion. No pneumothorax. Osseous structures within normal limits. IMPRESSION: 1. Endotracheal tube in place with tip position 4.6 cm above the carina. 2. Low lung volumes.  No other active cardiopulmonary disease. Electronically Signed   By: Rise Mu M.D.   On: 09/08/2018 05:41   Dg Abd Portable 1v  Result Date: 09/09/2018 CLINICAL DATA:  Orogastric tube placement. EXAM: PORTABLE ABDOMEN - 1 VIEW COMPARISON:  None. FINDINGS: Tip and side port of the enteric tube below the diaphragm in the stomach. Normal bowel gas pattern. No bowel dilatation. IMPRESSION: Tip and side port of the enteric tube below the diaphragm in the stomach. Electronically Signed   By: Narda Rutherford M.D.   On: 09/09/2018 06:22   Dg Foot Complete Left  Result Date: 08/30/2018 Please see detailed radiograph report in office note.  Dg Foot Complete Right  Result Date: 08/30/2018 Please see detailed radiograph report in office note.     Subjective: No issues today. No hallucinations. Tremors improved.  Discharge Exam: Vitals:   09/21/18 0754 09/21/18 1229  BP: 134/74 127/85  Pulse: 87 90  Resp: 17 17  Temp: 98.6 F (37 C) 99.3 F (37.4 C)  SpO2: 100% 100%   Vitals:   09/21/18 0355 09/21/18 0500 09/21/18 0754 09/21/18 1229  BP: (!) 144/86  134/74 127/85  Pulse: (!) 53  87 90  Resp: 16  17 17   Temp: 98.4 F (36.9 C)  98.6 F (37 C) 99.3 F (37.4 C)  TempSrc: Oral  Oral Oral  SpO2: 100%  100% 100%  Weight:  88 kg    Height:         General: Pt is alert, awake, not in acute distress Cardiovascular: RRR, S1/S2 +, no rubs, no gallops Respiratory: CTA bilaterally, no wheezing, no rhonchi Abdominal: Soft, NT, ND, bowel sounds + Extremities: no edema, no cyanosis    The results of significant diagnostics from this hospitalization (including imaging, microbiology, ancillary and laboratory) are listed below for reference.     Microbiology: No results found for this or any previous visit (from the past 240 hour(s)).   Labs: BNP (last 3 results) No results for input(s): BNP in the last 8760 hours. Basic Metabolic Panel: Recent Labs  Lab 09/17/18 0445 09/18/18 1157  NA 139 139  K 3.5 3.9  CL 103 107  CO2 25 20*  GLUCOSE 90 110*  BUN 8 9  CREATININE 1.12 0.91  CALCIUM 9.4 9.1  MG 1.7  --   PHOS 5.6*  --    Liver Function Tests: No results for input(s): AST, ALT, ALKPHOS, BILITOT, PROT, ALBUMIN in the last 168 hours. No results for input(s): LIPASE, AMYLASE in the last 168 hours. No results for input(s): AMMONIA in the last 168 hours. CBC: Recent Labs  Lab 09/17/18 0445  WBC 6.2  NEUTROABS 3.2  HGB 11.9*  HCT 34.7*  MCV 100.6*  PLT 288    SIGNED:   Jacquelin Hawking, MD [facility] 09/23/2018, 5:23 PM

## 2018-09-28 ENCOUNTER — Ambulatory Visit: Payer: Self-pay | Admitting: Diagnostic Neuroimaging

## 2018-09-28 ENCOUNTER — Encounter: Payer: Self-pay | Admitting: Diagnostic Neuroimaging

## 2018-09-28 VITALS — BP 134/82 | HR 95 | Ht 71.0 in | Wt 203.4 lb

## 2018-09-28 DIAGNOSIS — S066X1S Traumatic subarachnoid hemorrhage with loss of consciousness of 30 minutes or less, sequela: Secondary | ICD-10-CM

## 2018-09-28 DIAGNOSIS — G40909 Epilepsy, unspecified, not intractable, without status epilepticus: Secondary | ICD-10-CM

## 2018-09-28 DIAGNOSIS — F1020 Alcohol dependence, uncomplicated: Secondary | ICD-10-CM

## 2018-09-28 DIAGNOSIS — R569 Unspecified convulsions: Secondary | ICD-10-CM

## 2018-09-28 MED ORDER — LEVETIRACETAM 1000 MG PO TABS: 1000.0000 mg | ORAL_TABLET | Freq: Two times a day (BID) | ORAL | 4 refills | Status: DC

## 2018-09-28 NOTE — Patient Instructions (Signed)
SEIZURE - continue levetiracetam 1000mg  twice a day   - According to [place] law, you can not drive unless you are seizure / syncope free for at least 6 months and under physician's care.   - Please maintain precautions. Do not participate in activities where a loss of awareness could harm you or someone else. No swimming alone, no tub bathing, no hot tubs, no driving, no operating motorized vehicles (cars, ATVs, motocycles, etc), lawnmowers, power tools or firearms. No standing at heights, such as rooftops, ladders or stairs. Avoid hot objects such as stoves, heaters, open fires. Wear a helmet when riding a bicycle, scooter, skateboard, etc. and avoid areas of traffic. Set your water heater to 120 degrees or less.   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

## 2018-09-28 NOTE — Progress Notes (Signed)
[facility]  PATIENT: Darren Diaz DOB: May 08, 1986  REFERRING CLINICIAN: Hospital discharge HISTORY FROM: patient  REASON FOR VISIT: new consult    HISTORICAL  CHIEF COMPLAINT:  Chief Complaint  Patient presents with  . Seizures    rm 7, New Pt, ED referral, "no prior seizures"    HISTORY OF PRESENT ILLNESS:   32 year old male here for evaluation of seizure.  Patient has history of alcohol abuse.  Patient presented to the emergency room on 09/08/2018 due to witnessed seizure at home.  Patient lives alone.  His family was coming in town to visit for Thanksgiving.  He had started to cut down on his daily alcohol use in anticipation of his family visiting.  On 09/07/2018 in the evening patient was in an argument with his family, acting erratically, when all of a sudden his parents heard him fall in another room.  When they came to his side he was having generalized convulsions.  Patient was taken to the emergency room by EMS.  Alcohol level was 125.  Patient had a second seizure in the emergency room, was intubated and started on antiseizure medication.  He was diagnosed with a traumatic subarachnoid hemorrhage as well, that likely occurred as a result of falling and hitting his head after the onset of the seizure.  Patient was also treated for alcohol withdrawal.  Patient had several more breakthrough seizures in the hospital.  Antiseizure medication was increased.  Ultimately patient was stabilized and discharged home on 09/21/2018.  Since that time patient is stable.  No recurrent seizures.  He has noticed increased tingling in his feet and legs.  He was referred to set up care with primary care physician.  He was discharged on gabapentin and quetiapine related to his alcohol dependence.  He does not have follow-up with psychiatry or psychology.  No family history of seizure.  No personal prior history of seizure.   REVIEW OF SYSTEMS: Full 14 system review of  systems performed and negative with exception of: As per HPI.   ALLERGIES: No Known Allergies  HOME MEDICATIONS: Outpatient Medications Prior to Visit  Medication Sig Dispense Refill  . feeding supplement, ENSURE ENLIVE, (ENSURE ENLIVE) LIQD Take 237 mLs by mouth 2 (two) times daily between meals. 60 Bottle 0  . folic acid (FOLVITE) 1 MG tablet Take 1 tablet (1 mg total) by mouth daily.    Marland Kitchen gabapentin (NEURONTIN) 600 MG tablet Take 0.5 tablets (300 mg total) by mouth 2 (two) times daily. 60 tablet 0  . levETIRAcetam (KEPPRA) 1000 MG tablet Take 1 tablet (1,000 mg total) by mouth 2 (two) times daily. 60 tablet 0  . Multiple Vitamin (MULTIVITAMIN WITH MINERALS) TABS tablet Take 1 tablet by mouth daily.    Marland Kitchen pyridOXINE (VITAMIN B-6) 100 MG tablet Take 100 mg by mouth daily.    . QUEtiapine (SEROQUEL) 50 MG tablet Take 1 tablet (50 mg total) by mouth at bedtime. 30 tablet 0  . thiamine 100 MG tablet Take 1 tablet (100 mg total) by mouth daily.    . cloNIDine (CATAPRES) 0.1 MG tablet Take 1 tablet (0.1 mg total) by mouth 3 (three) times daily for 1 day, THEN 1 tablet (0.1 mg total) 2 (two) times daily for 1 day, THEN 1 tablet (0.1 mg total) daily for 1 day. 6 tablet 0   No facility-administered medications prior to visit.     PAST MEDICAL HISTORY: Past Medical History:  Diagnosis Date  . Seizures (HCC) 08/2018  PAST SURGICAL HISTORY: Past Surgical History:  Procedure Laterality Date  . 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 Fibromyalgia Mother     SOCIAL HISTORY: Social History   Socioeconomic History  . Marital status: Single    Spouse name: Not on file  . Number of children: 0  . Years of education: Not on file  . Highest education level: Master's degree (e.g., MA, MS, MEng, MEd, MSW, MBA)  Occupational History  . Not on file  Social Needs  . Financial resource strain: Not on file  .  Food insecurity:    Worry: Not on file    Inability: Not on file  . Transportation needs:    Medical: Not on file    Non-medical: Not on file  Tobacco Use  . Smoking status: Current Every Day Smoker    Types: Cigarettes  . Smokeless tobacco: Never Used  . Tobacco comment: 09/28/18 1/3 PPD  Substance and Sexual Activity  . Alcohol use: Yes    Alcohol/week: 6.0 standard drinks    Types: 3 Cans of beer, 3 Standard drinks or equivalent per week    Comment: 09/28/18 1-2 daily  . Drug use: No  . Sexual activity: Not on file  Lifestyle  . Physical activity:    Days per week: Not on file    Minutes per session: Not on file  . Stress: Not on file  Relationships  . Social connections:    Talks on phone: Not on file    Gets together: Not on file    Attends religious service: Not on file    Active member of club or organization: Not on file    Attends meetings of clubs or organizations: Not on file    Relationship status: Not on file  . Intimate partner violence:    Fear of current or ex partner: Not on file    Emotionally abused: Not on file    Physically abused: Not on file    Forced sexual activity: Not on file  Other Topics Concern  . Not on file  Social History Narrative   Lives alone   Caffeine- 0-1 cups coffee daily     PHYSICAL EXAM  GENERAL EXAM/CONSTITUTIONAL: Vitals:  Vitals:   09/28/18 1124  BP: 134/82  Pulse: 95  Weight: 203 lb 6.4 oz (92.3 kg)  Height: 5\' 11"  (1.803 m)     Body mass index is 28.37 kg/m. Wt Readings from Last 3 Encounters:  09/28/18 203 lb 6.4 oz (92.3 kg)  09/21/18 194 lb 0.1 oz (88 kg)  07/08/14 230 lb (104.3 kg)     Patient is in no distress; well developed, nourished and groomed; neck is supple  CARDIOVASCULAR:  Examination of carotid arteries is normal; no carotid bruits  Regular rate and rhythm, no murmurs  Examination of peripheral vascular system by observation and palpation is normal  EYES:  Ophthalmoscopic exam  of optic discs and posterior segments is normal; no papilledema or hemorrhages  Visual Acuity Screening   Right eye Left eye Both eyes  Without correction: 20/40 20/50   With correction:     Comments: Didn't have glasses with him, uses to drive only    MUSCULOSKELETAL:  Gait, strength, tone, movements noted in Neurologic exam below  NEUROLOGIC: MENTAL STATUS:  No flowsheet data found.  awake, alert, oriented to person, place and time  recent and remote  memory intact  normal attention and concentration  language fluent, comprehension intact, naming intact  fund of knowledge appropriate  CRANIAL NERVE:   2nd - no papilledema on fundoscopic exam  2nd, 3rd, 4th, 6th - pupils equal and reactive to light, visual fields full to confrontation, extraocular muscles intact, no nystagmus  5th - facial sensation symmetric  7th - facial strength symmetric  8th - hearing intact  9th - palate elevates symmetrically, uvula midline  11th - shoulder shrug symmetric  12th - tongue protrusion midline  MOTOR:   normal bulk and tone, full strength in the BUE, BLE  SENSORY:   normal and symmetric to light touch, temperature, vibration  COORDINATION:   finger-nose-finger, fine finger movements normal  REFLEXES:   deep tendon reflexes present and symmetric  GAIT/STATION:   narrow based gait; able to walk tandem; romberg is negative     DIAGNOSTIC DATA (LABS, IMAGING, TESTING) - I reviewed patient records, labs, notes, testing and imaging myself where available.  Lab Results  Component Value Date   WBC 6.2 09/17/2018   HGB 11.9 (L) 09/17/2018   HCT 34.7 (L) 09/17/2018   MCV 100.6 (H) 09/17/2018   PLT 288 09/17/2018      Component Value Date/Time   NA 139 09/18/2018 1157   K 3.9 09/18/2018 1157   CL 107 09/18/2018 1157   CO2 20 (L) 09/18/2018 1157   GLUCOSE 110 (H) 09/18/2018 1157   BUN 9 09/18/2018 1157   CREATININE 0.91 09/18/2018 1157   CALCIUM 9.1  09/18/2018 1157   PROT 7.8 09/08/2018 0419   ALBUMIN 3.3 (L) 09/09/2018 0314   AST 198 (H) 09/08/2018 0419   ALT 89 (H) 09/08/2018 0419   ALKPHOS 66 09/08/2018 0419   BILITOT 1.2 09/08/2018 0419   GFRNONAA >60 09/18/2018 1157   GFRAA >60 09/18/2018 1157   Lab Results  Component Value Date   TRIG 68 09/08/2018   Lab Results  Component Value Date   HGBA1C 4.1 (L) 09/14/2018   Lab Results  Component Value Date   VITAMINB12 426 09/09/2018   Lab Results  Component Value Date   TSH 3.228 09/09/2018    09/08/18 MRI brain [I reviewed images myself. Right temporal SAH and contusion. -VRP]  1. Acute brain finding of abnormal subarachnoid fluid or blood in the right temporal lobe. This might be trace subarachnoid hemorrhage (posttraumatic) with differential diagnosis including proteinaceous CSF such as due to meningitis. No ventricular debris. Follow-up noncontrast Head CT for correlation would be valuable. 2. Negative for acute infarct or cerebral edema. 3. Generalized cerebral volume loss for age, which can be seen in the setting of chronic systemic diseases such as HIV.  4. Broad-based right posterior scalp hematoma. ADDENDUM:  - I have now become aware that the patient already underwent noncontrast head CT earlier today (report but no images currently available in [facility], probably due to an unscheduled IT downtime). - The report describes a small right temporal lobe hemorrhage/hemorrhagic contusion with possible SAH. CONCLUSION: - Sequelae of trauma with small volume right temporal lobe hemorrhage strongly suspected to explain the abnormal subarachnoid FLAIR signal in #1.  09/18/18 MRI brain [I reviewed images myself and agree with interpretation. -VRP]  - Brain parenchyma appears normal today. No evidence of residual intracranial hemorrhage. No sign that there was an intraparenchymal component.  - Left mastoid effusion, not seen previously. - Resolving right parietal scalp  hematoma and edema.  09/08/18 EEG - Sedated EEG - Clinical Interpretation: This EEG  is consistent with the patient's known sedated state.  There was no seizure or seizure predisposition recorded on this study. Please note that a normal EEG does not preclude the possibility of epilepsy.    ASSESSMENT AND PLAN  32 y.o. year old male here with history of alcohol abuse, with multiple seizures in the setting of alcohol withdrawal.  Also found to have traumatic subarachnoid hemorrhage, related to falling as a result of his initial seizure.  We will continue antiseizure medication for now, and would anticipate treatment for at least 3 years until seizure-free and alcohol abuse/dependence is resolved.   Dx:  1. Seizure disorder (HCC)   2. 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 Alcohol use disorder, severe, dependence (HCC)      PLAN:  SEIZURE - continue levetiracetam 1000mg  twice a day  - According to  law, you can not drive unless you are seizure / syncope free for at least 6 months and under physician's care.  - Please maintain precautions. Do not participate in activities where a loss of awareness could harm you or someone else. No swimming alone, no tub bathing, no hot tubs, no driving, no operating motorized vehicles (cars, ATVs, motocycles, etc), lawnmowers, power tools or firearms. No standing at heights, such as rooftops, ladders or stairs. Avoid hot objects such as stoves, heaters, open fires. Wear a helmet when riding a bicycle, scooter, skateboard, etc. and avoid areas of traffic. Set your water heater to 120 degrees or less.  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 levETIRAcetam (KEPPRA) 1000 MG tablet    Sig: Take 1 tablet (1,000 mg total) by mouth 2 (two) times daily.    Dispense:   180 tablet    Refill:  4   Return in about 4 months (around 01/28/2019).  I reviewed images, labs, notes, records myself. I summarized findings and reviewed with patient, for this high risk condition (seizure) requiring high complexity decision making.    Suanne Marker, MD 09/28/2018, 11:35 AM Certified in Neurology, Neurophysiology and Neuroimaging  [facility] [address] [place], [place] [address] [phone]

## 2018-10-03 ENCOUNTER — Ambulatory Visit: Payer: Self-pay | Admitting: Family Medicine

## 2018-11-12 NOTE — Progress Notes (Deleted)
[facility] Neurology Division Clinic Note - Initial Visit   Date: 11/12/18  Darren Diaz MRN: [id number] DOB: 1986/07/17   Dear Dr. Ardelle Anton:  Thank you for your kind referral of Darren Diaz for consultation of neuropathy. Although his history is well known to you, please allow Darren Diaz to reiterate it for the purpose of our medical record. The patient was accompanied to the clinic by *** who also provides collateral information.     History of Present Illness: Darren Diaz is a 33 y.o. ***-handed Caucasian male with *** presenting for evaluation of ***.    Out-side paper records, electronic medical record, and images have been reviewed where available and summarized as: ***  Past Medical History:  Diagnosis Date  . Seizures (HCC) 08/2018    Past Surgical History:  Procedure Laterality Date  . PATENT DUCTUS ARTERIOUS REPAIR     33 years old     Medications:  Outpatient Encounter Medications as of 11/14/2018  Medication Sig  . cloNIDine (CATAPRES) 0.1 MG tablet Take 1 tablet (0.1 mg total) by mouth 3 (three) times daily for 1 day, THEN 1 tablet (0.1 mg total) 2 (two) times daily for 1 day, THEN 1 tablet (0.1 mg total) daily for 1 day.  . feeding supplement, ENSURE ENLIVE, (ENSURE ENLIVE) LIQD Take 237 mLs by mouth 2 (two) times daily between meals.  . folic acid (FOLVITE) 1 MG tablet Take 1 tablet (1 mg total) by mouth daily.  Marland Kitchen gabapentin (NEURONTIN) 600 MG tablet Take 0.5 tablets (300 mg total) by mouth 2 (two) times daily.  Marland Kitchen levETIRAcetam (KEPPRA) 1000 MG tablet Take 1 tablet (1,000 mg total) by mouth 2 (two) times daily.  . Multiple Vitamin (MULTIVITAMIN WITH MINERALS) TABS tablet Take 1 tablet by mouth daily.  Marland Kitchen pyridOXINE (VITAMIN B-6) 100 MG tablet Take 100 mg by mouth daily.  . QUEtiapine (SEROQUEL) 50 MG tablet Take 1 tablet (50 mg total) by mouth at bedtime.  . thiamine 100 MG tablet Take 1 tablet (100 mg total) by mouth daily.   No facility-administered  encounter medications on file as of 11/14/2018.      Allergies: No Known Allergies  Family History: Family History  Problem Relation Age of Onset  . Fibromyalgia Mother     Social History: Social History   Tobacco Use  . Smoking status: Current Every Day Smoker    Types: Cigarettes  . Smokeless tobacco: Never Used  . Tobacco comment: 09/28/18 1/3 PPD  Substance Use Topics  . Alcohol use: Yes    Alcohol/week: 6.0 standard drinks    Types: 3 Cans of beer, 3 Standard drinks or equivalent per week    Comment: 09/28/18 1-2 daily  . Drug use: No   Social History   Social History Narrative   Lives alone   Caffeine- 0-1 cups coffee daily    Review of Systems:  CONSTITUTIONAL: No fevers, chills, night sweats, or weight loss.  *** EYES: No visual changes or eye pain ENT: No hearing changes.  No history of nose bleeds.   RESPIRATORY: No cough, wheezing and shortness of breath.   CARDIOVASCULAR: Negative for chest pain, and palpitations.   GI: Negative for abdominal discomfort, blood in stools or black stools.  No recent change in bowel habits.   GU:  No history of incontinence.   MUSCLOSKELETAL: No history of joint pain or swelling.  No myalgias.   SKIN: Negative for lesions, rash, and itching.   HEMATOLOGY/ONCOLOGY: Negative for prolonged bleeding, bruising easily, and  swollen nodes.  No history of cancer.   ENDOCRINE: Negative for cold or heat intolerance, polydipsia or goiter.   PSYCH:  ***depression or anxiety symptoms.   NEURO: As Above.   Vital Signs:  There were no vitals taken for this visit. Pain Scale: *** on a scale of 0-10   General Medical Exam:  *** General:  Well appearing, comfortable.   Eyes/ENT: see cranial nerve examination.   Neck: No masses appreciated.  Full range of motion without tenderness.  No carotid bruits. Respiratory:  Clear to auscultation, good air entry bilaterally.   Cardiac:  Regular rate and rhythm, no murmur.   Extremities:  No  deformities, edema, or skin discoloration.  Skin:  No rashes or lesions.  Neurological Exam: MENTAL STATUS including orientation to time, place, person, recent and remote memory, attention span and concentration, language, and fund of knowledge is ***normal.  Speech is not dysarthric.  CRANIAL NERVES: II:  No visual field defects.  Unremarkable fundi.   III-IV-VI: Pupils equal round and reactive to light.  Normal conjugate, extra-ocular eye movements in all directions of gaze.  No nystagmus.  No ptosis***.   V:  Normal facial sensation.  Jaw jerk is ***.   VII:  Normal facial symmetry and movements.  No pathologic facial reflexes.  VIII:  Normal hearing and vestibular function.   IX-X:  Normal palatal movement.   XI:  Normal shoulder shrug and head rotation.   XII:  Normal tongue strength and range of motion, no deviation or fasciculation.  MOTOR:  No atrophy, fasciculations or abnormal movements.  No pronator drift.  Tone is normal.    Right Upper Extremity:    Left Upper Extremity:    Deltoid  5/5   Deltoid  5/5   Biceps  5/5   Biceps  5/5   Triceps  5/5   Triceps  5/5   Wrist extensors  5/5   Wrist extensors  5/5   Wrist flexors  5/5   Wrist flexors  5/5   Finger extensors  5/5   Finger extensors  5/5   Finger flexors  5/5   Finger flexors  5/5   Dorsal interossei  5/5   Dorsal interossei  5/5   Abductor pollicis  5/5   Abductor pollicis  5/5   Tone (Ashworth scale)  0  Tone (Ashworth scale)  0   Right Lower Extremity:    Left Lower Extremity:    Hip flexors  5/5   Hip flexors  5/5   Hip extensors  5/5   Hip extensors  5/5   Knee flexors  5/5   Knee flexors  5/5   Knee extensors  5/5   Knee extensors  5/5   Dorsiflexors  5/5   Dorsiflexors  5/5   Plantarflexors  5/5   Plantarflexors  5/5   Toe extensors  5/5   Toe extensors  5/5   Toe flexors  5/5   Toe flexors  5/5   Tone (Ashworth scale)  0  Tone (Ashworth scale)  0   MSRs:  Right                                                                  Left brachioradialis 2+  brachioradialis 2+  biceps 2+  biceps 2+  triceps 2+  triceps 2+  patellar 2+  patellar 2+  ankle jerk 2+  ankle jerk 2+  Hoffman no  Hoffman no  plantar response down  plantar response down   SENSORY:  Normal and symmetric perception of light touch, pinprick, vibration, and proprioception.  Romberg's sign absent.   COORDINATION/GAIT: Normal finger-to- nose-finger and heel-to-shin.  Intact rapid alternating movements bilaterally.  Able to rise from a chair without using arms.  Gait narrow based and stable. Tandem and stressed gait intact.    IMPRESSION: ***  PLAN/RECOMMENDATIONS:  *** Return to clinic in *** months.   The duration of this appointment visit was *** minutes of face-to-face time with the patient.  Greater than 50% of this time was spent in counseling, explanation of diagnosis, planning of further management, and coordination of care.   Thank you for allowing me to participate in patient's care.  If I can answer any additional questions, I would be pleased to do so.    Sincerely,    Armonee Bojanowski K. Allena Katz, DO

## 2018-11-14 ENCOUNTER — Ambulatory Visit: Payer: Self-pay | Admitting: Neurology

## 2018-12-17 ENCOUNTER — Encounter (HOSPITAL_COMMUNITY): Payer: Self-pay | Admitting: Emergency Medicine

## 2018-12-17 ENCOUNTER — Inpatient Hospital Stay (HOSPITAL_COMMUNITY): Payer: BLUE CROSS/BLUE SHIELD

## 2018-12-17 ENCOUNTER — Inpatient Hospital Stay (HOSPITAL_COMMUNITY)
Admission: EM | Admit: 2018-12-17 | Discharge: 2019-01-02 | DRG: 853 | Disposition: A | Discharge status: Home Health | Payer: BLUE CROSS/BLUE SHIELD | Attending: Internal Medicine | Admitting: Internal Medicine

## 2018-12-17 ENCOUNTER — Other Ambulatory Visit: Payer: Self-pay

## 2018-12-17 ENCOUNTER — Emergency Department (HOSPITAL_COMMUNITY): Payer: BLUE CROSS/BLUE SHIELD

## 2018-12-17 DIAGNOSIS — Z781 Physical restraint status: Secondary | ICD-10-CM

## 2018-12-17 DIAGNOSIS — L03115 Cellulitis of right lower limb: Secondary | ICD-10-CM | POA: Diagnosis present

## 2018-12-17 DIAGNOSIS — Z8669 Personal history of other diseases of the nervous system and sense organs: Secondary | ICD-10-CM | POA: Diagnosis not present

## 2018-12-17 DIAGNOSIS — I5041 Acute combined systolic (congestive) and diastolic (congestive) heart failure: Secondary | ICD-10-CM

## 2018-12-17 DIAGNOSIS — R0602 Shortness of breath: Secondary | ICD-10-CM | POA: Diagnosis not present

## 2018-12-17 DIAGNOSIS — Z9911 Dependence on respirator [ventilator] status: Secondary | ICD-10-CM | POA: Diagnosis not present

## 2018-12-17 DIAGNOSIS — B950 Streptococcus, group A, as the cause of diseases classified elsewhere: Secondary | ICD-10-CM | POA: Diagnosis present

## 2018-12-17 DIAGNOSIS — G40509 Epileptic seizures related to external causes, not intractable, without status epilepticus: Secondary | ICD-10-CM | POA: Diagnosis present

## 2018-12-17 DIAGNOSIS — J9600 Acute respiratory failure, unspecified whether with hypoxia or hypercapnia: Secondary | ICD-10-CM

## 2018-12-17 DIAGNOSIS — A400 Sepsis due to streptococcus, group A: Secondary | ICD-10-CM | POA: Diagnosis present

## 2018-12-17 DIAGNOSIS — R569 Unspecified convulsions: Secondary | ICD-10-CM | POA: Diagnosis not present

## 2018-12-17 DIAGNOSIS — I5023 Acute on chronic systolic (congestive) heart failure: Secondary | ICD-10-CM | POA: Diagnosis present

## 2018-12-17 DIAGNOSIS — N179 Acute kidney failure, unspecified: Secondary | ICD-10-CM | POA: Diagnosis present

## 2018-12-17 DIAGNOSIS — G9341 Metabolic encephalopathy: Secondary | ICD-10-CM | POA: Diagnosis present

## 2018-12-17 DIAGNOSIS — I426 Alcoholic cardiomyopathy: Secondary | ICD-10-CM | POA: Diagnosis present

## 2018-12-17 DIAGNOSIS — I5021 Acute systolic (congestive) heart failure: Secondary | ICD-10-CM | POA: Diagnosis not present

## 2018-12-17 DIAGNOSIS — A409 Streptococcal sepsis, unspecified: Secondary | ICD-10-CM | POA: Diagnosis present

## 2018-12-17 DIAGNOSIS — M00011 Staphylococcal arthritis, right shoulder: Secondary | ICD-10-CM | POA: Diagnosis not present

## 2018-12-17 DIAGNOSIS — B951 Streptococcus, group B, as the cause of diseases classified elsewhere: Secondary | ICD-10-CM | POA: Diagnosis not present

## 2018-12-17 DIAGNOSIS — R509 Fever, unspecified: Secondary | ICD-10-CM | POA: Diagnosis not present

## 2018-12-17 DIAGNOSIS — Z79899 Other long term (current) drug therapy: Secondary | ICD-10-CM

## 2018-12-17 DIAGNOSIS — R945 Abnormal results of liver function studies: Secondary | ICD-10-CM

## 2018-12-17 DIAGNOSIS — F17210 Nicotine dependence, cigarettes, uncomplicated: Secondary | ICD-10-CM | POA: Diagnosis present

## 2018-12-17 DIAGNOSIS — R197 Diarrhea, unspecified: Secondary | ICD-10-CM | POA: Diagnosis not present

## 2018-12-17 DIAGNOSIS — K7200 Acute and subacute hepatic failure without coma: Secondary | ICD-10-CM | POA: Diagnosis present

## 2018-12-17 DIAGNOSIS — Z978 Presence of other specified devices: Secondary | ICD-10-CM

## 2018-12-17 DIAGNOSIS — F10239 Alcohol dependence with withdrawal, unspecified: Secondary | ICD-10-CM

## 2018-12-17 DIAGNOSIS — Z9289 Personal history of other medical treatment: Secondary | ICD-10-CM

## 2018-12-17 DIAGNOSIS — F10231 Alcohol dependence with withdrawal delirium: Secondary | ICD-10-CM | POA: Diagnosis present

## 2018-12-17 DIAGNOSIS — M009 Pyogenic arthritis, unspecified: Secondary | ICD-10-CM | POA: Diagnosis present

## 2018-12-17 DIAGNOSIS — G8929 Other chronic pain: Secondary | ICD-10-CM | POA: Diagnosis present

## 2018-12-17 DIAGNOSIS — J9601 Acute respiratory failure with hypoxia: Secondary | ICD-10-CM | POA: Diagnosis present

## 2018-12-17 DIAGNOSIS — N182 Chronic kidney disease, stage 2 (mild): Secondary | ICD-10-CM | POA: Diagnosis present

## 2018-12-17 DIAGNOSIS — A401 Sepsis due to streptococcus, group B: Principal | ICD-10-CM | POA: Diagnosis present

## 2018-12-17 DIAGNOSIS — E876 Hypokalemia: Secondary | ICD-10-CM | POA: Diagnosis present

## 2018-12-17 DIAGNOSIS — L0390 Cellulitis, unspecified: Secondary | ICD-10-CM | POA: Diagnosis not present

## 2018-12-17 DIAGNOSIS — L8990 Pressure ulcer of unspecified site, unspecified stage: Secondary | ICD-10-CM

## 2018-12-17 DIAGNOSIS — I428 Other cardiomyopathies: Secondary | ICD-10-CM | POA: Diagnosis not present

## 2018-12-17 DIAGNOSIS — J810 Acute pulmonary edema: Secondary | ICD-10-CM | POA: Diagnosis not present

## 2018-12-17 DIAGNOSIS — R7881 Bacteremia: Secondary | ICD-10-CM | POA: Diagnosis not present

## 2018-12-17 DIAGNOSIS — G9340 Encephalopathy, unspecified: Secondary | ICD-10-CM | POA: Diagnosis not present

## 2018-12-17 DIAGNOSIS — Z9114 Patient's other noncompliance with medication regimen: Secondary | ICD-10-CM

## 2018-12-17 DIAGNOSIS — I130 Hypertensive heart and chronic kidney disease with heart failure and stage 1 through stage 4 chronic kidney disease, or unspecified chronic kidney disease: Secondary | ICD-10-CM | POA: Diagnosis present

## 2018-12-17 DIAGNOSIS — M25511 Pain in right shoulder: Secondary | ICD-10-CM

## 2018-12-17 DIAGNOSIS — I429 Cardiomyopathy, unspecified: Secondary | ICD-10-CM | POA: Diagnosis not present

## 2018-12-17 DIAGNOSIS — R918 Other nonspecific abnormal finding of lung field: Secondary | ICD-10-CM | POA: Diagnosis not present

## 2018-12-17 DIAGNOSIS — R740 Nonspecific elevation of levels of transaminase and lactic acid dehydrogenase [LDH]: Secondary | ICD-10-CM | POA: Diagnosis not present

## 2018-12-17 DIAGNOSIS — R7989 Other specified abnormal findings of blood chemistry: Secondary | ICD-10-CM

## 2018-12-17 DIAGNOSIS — F10939 Alcohol use, unspecified with withdrawal, unspecified: Secondary | ICD-10-CM

## 2018-12-17 HISTORY — DX: Personal history of (corrected) congenital malformations of heart and circulatory system: Z87.74

## 2018-12-17 HISTORY — DX: Alcohol abuse, uncomplicated: F10.10

## 2018-12-17 HISTORY — DX: Tobacco use: Z72.0

## 2018-12-17 LAB — COMPREHENSIVE METABOLIC PANEL
ALT: 51 U/L — AB (ref 0–44)
AST: 83 U/L — ABNORMAL HIGH (ref 15–41)
Albumin: 4.3 g/dL (ref 3.5–5.0)
Alkaline Phosphatase: 58 U/L (ref 38–126)
Anion gap: 17 — ABNORMAL HIGH (ref 5–15)
BUN: 6 mg/dL (ref 6–20)
CO2: 19 mmol/L — ABNORMAL LOW (ref 22–32)
CREATININE: 0.75 mg/dL (ref 0.61–1.24)
Calcium: 9.6 mg/dL (ref 8.9–10.3)
Chloride: 101 mmol/L (ref 98–111)
GFR calc Af Amer: >60 mL/min (ref >60)
GFR calc non Af Amer: >60 mL/min (ref >60)
Glucose, Bld: 165 mg/dL — ABNORMAL HIGH (ref 70–99)
Potassium: 3.6 mmol/L (ref 3.5–5.1)
Sodium: 137 mmol/L (ref 135–145)
Total Bilirubin: 1.3 mg/dL — ABNORMAL HIGH (ref 0.3–1.2)
Total Protein: 7.7 g/dL (ref 6.5–8.1)

## 2018-12-17 LAB — CBC
HCT: 39.2 % (ref 39.0–52.0)
Hemoglobin: 13.5 g/dL (ref 13.0–17.0)
MCH: 34.6 pg — ABNORMAL HIGH (ref 26.0–34.0)
MCHC: 34.4 g/dL (ref 30.0–36.0)
MCV: 100.5 fL — ABNORMAL HIGH (ref 80.0–100.0)
PLATELETS: DECREASED 10*3/uL (ref 150–400)
RBC: 3.9 MIL/uL — ABNORMAL LOW (ref 4.22–5.81)
RDW: 14.3 % (ref 11.5–15.5)
WBC: 5 10*3/uL (ref 4.0–10.5)
nRBC: 0 % (ref 0.0–0.2)

## 2018-12-17 LAB — ETHANOL: Alcohol, Ethyl (B): <10 mg/dL (ref <10)

## 2018-12-17 MED ORDER — LORAZEPAM 2 MG/ML IJ SOLN
2.0000 mg | Freq: Once | INTRAMUSCULAR | Status: AC
  Administered 2018-12-17: 2 mg via INTRAVENOUS
  Filled 2018-12-17: qty 1

## 2018-12-17 MED ORDER — LORAZEPAM 1 MG PO TABS: 1.0000 mg | ORAL_TABLET | Freq: Four times a day (QID) | ORAL | Status: DC | PRN

## 2018-12-17 MED ORDER — ADULT MULTIVITAMIN W/MINERALS CH: 1.0000 | ORAL_TABLET | Freq: Every day | ORAL | Status: DC

## 2018-12-17 MED ORDER — QUETIAPINE FUMARATE 50 MG PO TABS
50.0000 mg | ORAL_TABLET | Freq: Every day | ORAL | Status: DC
  Administered 2018-12-17 – 2018-12-20 (×4): 50 mg via ORAL
  Filled 2018-12-17 (×2): qty 1
  Filled 2018-12-17 (×2): qty 2
  Filled 2018-12-17: qty 1
  Filled 2018-12-17 (×2): qty 2
  Filled 2018-12-17: qty 1

## 2018-12-17 MED ORDER — THIAMINE HCL 100 MG/ML IJ SOLN
100.0000 mg | Freq: Every day | INTRAMUSCULAR | Status: DC
  Administered 2018-12-18: 100 mg via INTRAVENOUS
  Filled 2018-12-17 (×2): qty 2

## 2018-12-17 MED ORDER — VITAMIN B-1 100 MG PO TABS
100.0000 mg | ORAL_TABLET | Freq: Every day | ORAL | Status: DC
  Administered 2018-12-18 – 2018-12-20 (×3): 100 mg via ORAL
  Filled 2018-12-17 (×3): qty 1

## 2018-12-17 MED ORDER — ENOXAPARIN SODIUM 40 MG/0.4ML ~~LOC~~ SOLN
40.0000 mg | SUBCUTANEOUS | Status: DC
  Administered 2018-12-18 – 2018-12-30 (×13): 40 mg via SUBCUTANEOUS
  Filled 2018-12-17 (×13): qty 0.4

## 2018-12-17 MED ORDER — LORAZEPAM 2 MG/ML IJ SOLN
0.0000 mg | Freq: Four times a day (QID) | INTRAMUSCULAR | Status: DC
  Administered 2018-12-17 – 2018-12-18 (×2): 2 mg via INTRAVENOUS
  Filled 2018-12-17 (×2): qty 1

## 2018-12-17 MED ORDER — VITAMIN B-6 100 MG PO TABS
100.0000 mg | ORAL_TABLET | Freq: Every day | ORAL | Status: DC
  Administered 2018-12-18: 100 mg via ORAL
  Filled 2018-12-17 (×3): qty 1

## 2018-12-17 MED ORDER — SODIUM CHLORIDE 0.9 % IV BOLUS
1000.0000 mL | Freq: Once | INTRAVENOUS | Status: AC
  Administered 2018-12-18: 1000 mL via INTRAVENOUS

## 2018-12-17 MED ORDER — ACETAMINOPHEN 325 MG PO TABS
650.0000 mg | ORAL_TABLET | Freq: Four times a day (QID) | ORAL | Status: DC | PRN
  Administered 2018-12-18 – 2018-12-23 (×7): 650 mg via ORAL
  Filled 2018-12-17 (×8): qty 2

## 2018-12-17 MED ORDER — ACETAMINOPHEN 650 MG RE SUPP
650.0000 mg | Freq: Four times a day (QID) | RECTAL | Status: DC | PRN
  Administered 2018-12-21: 650 mg via RECTAL
  Filled 2018-12-17 (×2): qty 1

## 2018-12-17 MED ORDER — ENSURE ENLIVE PO LIQD
237.0000 mL | Freq: Two times a day (BID) | ORAL | Status: DC
  Administered 2018-12-20: 237 mL via ORAL

## 2018-12-17 MED ORDER — LORAZEPAM 1 MG PO TABS: 0.0000 mg | ORAL_TABLET | Freq: Four times a day (QID) | ORAL | Status: DC

## 2018-12-17 MED ORDER — LORAZEPAM 1 MG PO TABS: 0.0000 mg | ORAL_TABLET | Freq: Two times a day (BID) | ORAL | Status: DC

## 2018-12-17 MED ORDER — ONDANSETRON HCL 4 MG PO TABS: 4.0000 mg | ORAL_TABLET | Freq: Four times a day (QID) | ORAL | Status: DC | PRN

## 2018-12-17 MED ORDER — FOLIC ACID 1 MG PO TABS: 1.0000 mg | ORAL_TABLET | Freq: Every day | ORAL | Status: DC

## 2018-12-17 MED ORDER — LORAZEPAM 2 MG/ML IJ SOLN: 0.0000 mg | Freq: Two times a day (BID) | INTRAMUSCULAR | Status: DC

## 2018-12-17 MED ORDER — LORAZEPAM 2 MG/ML IJ SOLN
1.0000 mg | Freq: Four times a day (QID) | INTRAMUSCULAR | Status: DC | PRN
  Administered 2018-12-18: 1 mg via INTRAVENOUS
  Filled 2018-12-17: qty 1

## 2018-12-17 MED ORDER — ONDANSETRON HCL 4 MG/2ML IJ SOLN
4.0000 mg | Freq: Four times a day (QID) | INTRAMUSCULAR | Status: DC | PRN
  Administered 2018-12-18: 4 mg via INTRAVENOUS
  Filled 2018-12-17: qty 2

## 2018-12-17 MED ORDER — GABAPENTIN 600 MG PO TABS
300.0000 mg | ORAL_TABLET | Freq: Two times a day (BID) | ORAL | Status: DC
  Administered 2018-12-17 – 2018-12-20 (×5): 300 mg via ORAL
  Filled 2018-12-17 (×5): qty 1
  Filled 2018-12-17: qty 0.5
  Filled 2018-12-17: qty 1

## 2018-12-17 MED ORDER — ADULT MULTIVITAMIN W/MINERALS CH
1.0000 | ORAL_TABLET | Freq: Every day | ORAL | Status: DC
  Administered 2018-12-17 – 2018-12-20 (×3): 1 via ORAL
  Filled 2018-12-17 (×5): qty 1

## 2018-12-17 MED ORDER — FOLIC ACID 1 MG PO TABS
1.0000 mg | ORAL_TABLET | Freq: Every day | ORAL | Status: DC
  Administered 2018-12-17 – 2018-12-20 (×3): 1 mg via ORAL
  Filled 2018-12-17 (×5): qty 1

## 2018-12-17 MED ORDER — LEVETIRACETAM 500 MG PO TABS
1000.0000 mg | ORAL_TABLET | Freq: Two times a day (BID) | ORAL | Status: DC
  Administered 2018-12-17: 1000 mg via ORAL
  Filled 2018-12-17 (×2): qty 2

## 2018-12-17 NOTE — ED Notes (Signed)
Seizure precautions

## 2018-12-17 NOTE — ED Triage Notes (Signed)
Pt arrives via EMS with reports of a witnessed seizure of approx 8 mins today. Nauseous today. Denies any pain. Pt did fall. 4 mg zofran given. Pt on keppra.

## 2018-12-17 NOTE — ED Provider Notes (Addendum)
[facility] EMERGENCY DEPARTMENT Provider Note   CSN: [id number] Arrival date & time: 12/17/18  1813    History   Chief Complaint Chief Complaint  Patient presents with  . Seizures    HPI Darren Diaz is a 33 y.o. male.     Patient with hx seizures, c/o seizure at home just pta. Was in kitchen, fell to ground, lasted several minutes, initially postictal/confused afterwards, but now mental status c/w baseline. Dull frontal headache post seizure/fall. No neck or back pain. Denies numbness/weakness. Pt does note intermittent nausea/vomiting all day, several episodes, prior to seizure. Emesis clear to sl yellow, not bloody or bilious. No abd pain. Had normal bm today. Hx etoh abuse, states is trying to cut back. Appears shaky. No delusions, delirium or hallucinations. Denies hx dts. Denies recent change in meds, states compliant w meds, including keppra. Pt unsure when last seizure was. Denies recent increased frequency of seizures.   The history is provided by the patient.  Seizures    Past Medical History:  Diagnosis Date  . Seizures (HCC) 08/2018    Patient Active Problem List   Diagnosis Date Noted  . Alcohol use disorder, severe, dependence (HCC)   . Alcohol abuse   . Status epilepticus (HCC)   . TBI (traumatic brain injury) (HCC)   . Tobacco abuse   . Acute blood loss anemia   . Seizures (HCC) 09/08/2018  . Acute respiratory failure with hypoxia (HCC)   . 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 Thrombocytopenia (HCC)     Past Surgical History:  Procedure Laterality Date  . 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 Authorizing Provider  cloNIDine (CATAPRES) 0.1 MG tablet Take 1 tablet (0.1 mg total) by mouth 3 (three) times daily for 1 day, THEN 1 tablet (0.1 mg total) 2 (two) times daily for 1 day, THEN 1  tablet (0.1 mg total) daily for 1 day. 09/21/18 09/24/18  Narda Bonds, MD  feeding supplement, ENSURE ENLIVE, (ENSURE ENLIVE) LIQD Take 237 mLs by mouth 2 (two) times daily between meals. 09/22/18   Narda Bonds, MD  folic acid (FOLVITE) 1 MG tablet Take 1 tablet (1 mg total) by mouth daily. 09/22/18   Narda Bonds, MD  gabapentin (NEURONTIN) 600 MG tablet Take 0.5 tablets (300 mg total) by mouth 2 (two) times daily. 09/21/18   Narda Bonds, MD  levETIRAcetam (KEPPRA) 1000 MG tablet Take 1 tablet (1,000 mg total) by mouth 2 (two) times daily. 09/28/18   Penumalli, Glenford Bayley, MD  Multiple Vitamin (MULTIVITAMIN WITH MINERALS) TABS tablet Take 1 tablet by mouth daily. 09/22/18   Narda Bonds, MD  pyridOXINE (VITAMIN B-6) 100 MG tablet Take 100 mg by mouth daily.    [provider]  QUEtiapine (SEROQUEL) 50 MG tablet Take 1 tablet (50 mg total) by mouth at bedtime. 09/21/18   Narda Bonds, MD  thiamine 100 MG tablet Take 1 tablet (100 mg total) by mouth daily. 09/22/18   Narda Bonds, MD    Family History Family History  Problem Relation Age of Onset  . Fibromyalgia Mother     Social History Social History   Tobacco Use  . Smoking status: Current Every Day Smoker    Types: Cigarettes  . Smokeless tobacco:  Never Used  . Tobacco comment: 09/28/18 1/3 PPD  Substance Use Topics  . Alcohol use: Yes    Alcohol/week: 6.0 standard drinks    Types: 3 Cans of beer, 3 Standard drinks or equivalent per week    Comment: 09/28/18 1-2 daily  . Drug use: No     Allergies   Patient has no known allergies.   Review of Systems Review of Systems  Constitutional: Negative for fever.  HENT: Negative for sore throat.   Eyes: Negative for pain and redness.  Respiratory: Negative for cough and shortness of breath.   Cardiovascular: Negative for chest pain.  Gastrointestinal: Positive for vomiting. Negative for abdominal pain and diarrhea.  Genitourinary: Negative for  dysuria and flank pain.  Musculoskeletal: Negative for back pain and neck pain.  Skin: Negative for rash.  Neurological: Positive for seizures and headaches. Negative for speech difficulty, weakness and numbness.  Hematological: Does not bruise/bleed easily.  Psychiatric/Behavioral: Negative for confusion.     Physical Exam Updated Vital Signs BP (!) 159/111   Pulse (!) 147   Temp 100 F (37.8 C) (Oral)   Resp 18   SpO2 97%   Physical Exam Vitals signs and nursing note reviewed.  Constitutional:      Appearance: He is well-developed.     Comments: Shaky/tremulous, tachycardic.   HENT:     Head:     Comments: Contusion forehead.     Nose: Nose normal.     Mouth/Throat:     Mouth: Mucous membranes are moist.     Pharynx: Oropharynx is clear.  Eyes:     General: No scleral icterus.    Conjunctiva/sclera: Conjunctivae normal.     Pupils: Pupils are equal, round, and reactive to light.  Neck:     Musculoskeletal: Normal range of motion and neck supple. No neck rigidity or muscular tenderness.     Trachea: No tracheal deviation.  Cardiovascular:     Rate and Rhythm: Regular rhythm. Tachycardia present.     Pulses: Normal pulses.     Heart sounds: Normal heart sounds. No murmur. No friction rub. No gallop.   Pulmonary:     Effort: Pulmonary effort is normal. No accessory muscle usage or respiratory distress.     Breath sounds: Normal breath sounds.  Chest:     Chest wall: No tenderness.  Abdominal:     General: Bowel sounds are normal. There is no distension.     Palpations: Abdomen is soft.     Tenderness: There is no abdominal tenderness. There is no guarding.  Genitourinary:    Comments: No cva tenderness. Musculoskeletal:        General: No swelling or tenderness.     Right lower leg: No edema.     Left lower leg: No edema.     Comments: CTLS spine, non tender, aligned, no step off. Good rom bil ext without pain or focal bony tenderness.   Skin:    General:  Skin is warm and dry.     Findings: No rash.  Neurological:     Mental Status: He is alert.     Comments: Alert, speech clear. Pt very tremulous/shaky. Motor intact bil, stre 5/5. sens grossly intact.   Psychiatric:     Comments: Anxious/shaky,.       ED Treatments / Results  Labs (all labs ordered are listed, but only abnormal results are displayed) Results for orders placed or performed during the hospital encounter of 12/17/18  CBC  Result  Value Ref Range   WBC 5.0 4.0 - 10.5 K/uL   RBC 3.90 (L) 4.22 - 5.81 MIL/uL   Hemoglobin 13.5 13.0 - 17.0 g/dL   HCT 49.2 01.0 - 07.1 %   MCV 100.5 (H) 80.0 - 100.0 fL   MCH 34.6 (H) 26.0 - 34.0 pg   MCHC 34.4 30.0 - 36.0 g/dL   RDW 21.9 75.8 - 83.2 %   Platelets  150 - 400 K/uL    PLATELET CLUMPS NOTED ON SMEAR, COUNT APPEARS DECREASED   nRBC 0.0 0.0 - 0.2 %  Comprehensive metabolic panel  Result Value Ref Range   Sodium 137 135 - 145 mmol/L   Potassium 3.6 3.5 - 5.1 mmol/L   Chloride 101 98 - 111 mmol/L   CO2 19 (L) 22 - 32 mmol/L   Glucose, Bld 165 (H) 70 - 99 mg/dL   BUN 6 6 - 20 mg/dL   Creatinine, Ser 5.49 0.61 - 1.24 mg/dL   Calcium 9.6 8.9 - 82.6 mg/dL   Total Protein 7.7 6.5 - 8.1 g/dL   Albumin 4.3 3.5 - 5.0 g/dL   AST 83 (H) 15 - 41 U/L   ALT 51 (H) 0 - 44 U/L   Alkaline Phosphatase 58 38 - 126 U/L   Total Bilirubin 1.3 (H) 0.3 - 1.2 mg/dL   GFR calc non Af Amer >60 >60 mL/min   GFR calc Af Amer >60 >60 mL/min   Anion gap 17 (H) 5 - 15  Ethanol  Result Value Ref Range   Alcohol, Ethyl (B) <10 <10 mg/dL   Ct Head Wo Contrast  Result Date: 12/17/2018 CLINICAL DATA:  Seizure, head trauma EXAM: CT HEAD WITHOUT CONTRAST TECHNIQUE: Contiguous axial images were obtained from the base of the skull through the vertex without intravenous contrast. COMPARISON:  MRI 09/18/2018 FINDINGS: Brain: No acute intracranial abnormality. Specifically, no hemorrhage, hydrocephalus, mass lesion, acute infarction, or significant  intracranial injury. Vascular: No hyperdense vessel or unexpected calcification. Skull: No acute calvarial abnormality. Sinuses/Orbits: Visualized paranasal sinuses and mastoids clear. Orbital soft tissues unremarkable. Other: None IMPRESSION: No acute intracranial abnormality. Electronically Signed   By: Charlett Nose M.D.   On: 12/17/2018 20:08    EKG EKG Interpretation  Date/Time:  Saturday December 17 2018 20:21:45 EST Ventricular Rate:  100 PR Interval:    QRS Duration: 106 QT Interval:  370 QTC Calculation: 478 R Axis:   75 Text Interpretation:  Sinus tachycardia Borderline prolonged QT interval Confirmed by Cathren Laine ([id number]) on 12/17/2018 8:25:39 PM   Radiology Ct Head Wo Contrast  Result Date: 12/17/2018 CLINICAL DATA:  Seizure, head trauma EXAM: CT HEAD WITHOUT CONTRAST TECHNIQUE: Contiguous axial images were obtained from the base of the skull through the vertex without intravenous contrast. COMPARISON:  MRI 09/18/2018 FINDINGS: Brain: No acute intracranial abnormality. Specifically, no hemorrhage, hydrocephalus, mass lesion, acute infarction, or significant intracranial injury. Vascular: No hyperdense vessel or unexpected calcification. Skull: No acute calvarial abnormality. Sinuses/Orbits: Visualized paranasal sinuses and mastoids clear. Orbital soft tissues unremarkable. Other: None IMPRESSION: No acute intracranial abnormality. Electronically Signed   By: Charlett Nose M.D.   On: 12/17/2018 20:08    Procedures Procedures (including critical care time)  Medications Ordered in ED Medications  LORazepam (ATIVAN) injection 2 mg (has no administration in time range)     Initial Impression / Assessment and Plan / ED Course  I have reviewed the triage vital signs and the nursing notes.  Pertinent labs & imaging results that were  available during my care of the patient were reviewed by me and considered in my medical decision making (see chart for details).  Iv ns. Continuous  pulse ox and monitor. o2 Lindsay. Stat labs. Ct. Seizure precautions.   Ns bolus. Ativan iv as pt very shaky, tachycardic.   CIWA protocol started.   Reviewed nursing notes and prior charts for additional history.   Labs reviewed - chem normal except hco3 sl low.  CT reviewed - no acute process.  Given low grade fevers, seizure, will also get cxr, add ua to labs.   Patient remains tachycardic, tremulous - given complicated etoh withdrawal, will admit - medicine service consulted for admission.  Additional ativan iv via ciwa protocol.   CRITICAL CARE RE: complicated etoh withdrawal with High CIWA, tachycardia, seizures.  Performed by: Suzi Roots Total critical care time: 35 minutes Critical care time was exclusive of separately billable procedures and treating other patients. Critical care was necessary to treat or prevent imminent or life-threatening deterioration. Critical care was time spent personally by me on the following activities: development of treatment plan with patient and/or surrogate as well as nursing, discussions with consultants, evaluation of patient's response to treatment, examination of patient, obtaining history from patient or surrogate, ordering and performing treatments and interventions, ordering and review of laboratory studies, ordering and review of radiographic studies, pulse oximetry and re-evaluation of patient's condition.   Final Clinical Impressions(s) / ED Diagnoses   Final diagnoses:  None    ED Discharge Orders    None          Cathren Laine, MD 12/17/18 2036

## 2018-12-17 NOTE — H&P (Signed)
History and Physical    [name] OVZ:[id number] DOB: [birth year] DOA: 12/17/2018  PCP: Patient, No Pcp Per  Patient coming from: Home  I have personally briefly reviewed patient's old medical records in [facility]  Chief Complaint: Seizure  HPI: Darren Diaz is a 33 y.o. male with medical history significant of Seizures, EtOH abuse.  He is on Keppra 1gm at baseline for seizure disorder.  He reports he has been trying to cut back on EtOH intake this past week.  Today he had seizure.  Friend heard a "thump" and found him seizing.  Lasted several mins.  Initially post-ictal / confused after but now back at baseline.  Pt does note intermittent nausea/vomiting all day, several episodes, prior to seizure. Emesis clear to sl yellow, not bloody or bilious. No abd pain. Had normal bm today.  No prior h/o DTs   ED Course: Mild tachycardia and noted to be tremulous.  EtOH level of 0.   Review of Systems: As per HPI otherwise 10 point review of systems negative.   Past Medical History:  Diagnosis Date  . Seizures (HCC) 08/2018    Past Surgical History:  Procedure Laterality Date  . 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 He has never used smokeless tobacco. He reports current alcohol use of about 6.0 standard drinks of alcohol per week. He reports that he does not use drugs.  No Known Allergies  Family History  Problem Relation Age of Onset  . Fibromyalgia Mother      Prior to Admission medications   Medication Sig Start Date End Date Taking? Authorizing Provider  cloNIDine (CATAPRES) 0.1 MG tablet Take 1 tablet (0.1 mg total) by mouth 3 (three) times daily for 1 day, THEN 1 tablet (0.1 mg total) 2 (two) times daily for 1 day, THEN 1 tablet (0.1 mg total) daily for 1 day. 09/21/18 09/24/18  Narda Bonds, MD  feeding supplement, ENSURE ENLIVE, (ENSURE ENLIVE) LIQD Take 237 mLs by mouth 2 (two) times daily between  meals. 09/22/18   Narda Bonds, MD  folic acid (FOLVITE) 1 MG tablet Take 1 tablet (1 mg total) by mouth daily. 09/22/18   Narda Bonds, MD  gabapentin (NEURONTIN) 600 MG tablet Take 0.5 tablets (300 mg total) by mouth 2 (two) times daily. 09/21/18   Narda Bonds, MD  levETIRAcetam (KEPPRA) 1000 MG tablet Take 1 tablet (1,000 mg total) by mouth 2 (two) times daily. 09/28/18   Penumalli, Glenford Bayley, MD  Multiple Vitamin (MULTIVITAMIN WITH MINERALS) TABS tablet Take 1 tablet by mouth daily. 09/22/18   Narda Bonds, MD  pyridOXINE (VITAMIN B-6) 100 MG tablet Take 100 mg by mouth daily.    [provider]  QUEtiapine (SEROQUEL) 50 MG tablet Take 1 tablet (50 mg total) by mouth at bedtime. 09/21/18   Narda Bonds, MD  thiamine 100 MG tablet Take 1 tablet (100 mg total) by mouth daily. 09/22/18   Narda Bonds, MD    Physical Exam: Vitals:   12/17/18 1818 12/17/18 1844 12/17/18 1900 12/17/18 1958  BP: (!) 159/111 (!) 160/98 (!) 165/99 (!) 158/106  Pulse: (!) 147 (!) 127 (!) 106   Resp: 18 14 20  (!) 22  Temp: 100 F (37.8 C)     TempSrc: Oral     SpO2: 97% 96% 96%     Constitutional: NAD, calm, comfortable Eyes: PERRL, lids  and conjunctivae normal ENMT: Mucous membranes are moist. Posterior pharynx clear of any exudate or lesions.Normal dentition.  Neck: normal, supple, no masses, no thyromegaly Respiratory: clear to auscultation bilaterally, no wheezing, no crackles. Normal respiratory effort. No accessory muscle use.  Cardiovascular: Tachycardic Abdomen: no tenderness, no masses palpated. No hepatosplenomegaly. Bowel sounds positive.  Musculoskeletal: no clubbing / cyanosis. No joint deformity upper and lower extremities. Good ROM, no contractures. Normal muscle tone.  Skin: no rashes, lesions, ulcers. No induration Neurologic: CN 2-12 grossly intact. Sensation intact, DTR normal. Strength 5/5 in all 4. Tremulous, shaky. Psychiatric: Normal judgment and insight.  Alert and oriented x 3. Normal mood.    Labs on Admission: I have personally reviewed following labs and imaging studies  CBC: Recent Labs  Lab 12/17/18 1850  WBC 5.0  HGB 13.5  HCT 39.2  MCV 100.5*  PLT PLATELET CLUMPS NOTED ON SMEAR, COUNT APPEARS DECREASED   Basic Metabolic Panel: Recent Labs  Lab 12/17/18 1850  NA 137  K 3.6  CL 101  CO2 19*  GLUCOSE 165*  BUN 6  CREATININE 0.75  CALCIUM 9.6   GFR: CrCl cannot be calculated (Unknown ideal weight.). Liver Function Tests: Recent Labs  Lab 12/17/18 1850  AST 83*  ALT 51*  ALKPHOS 58  BILITOT 1.3*  PROT 7.7  ALBUMIN 4.3   No results for input(s): LIPASE, AMYLASE in the last 168 hours. No results for input(s): AMMONIA in the last 168 hours. Coagulation Profile: No results for input(s): INR, PROTIME in the last 168 hours. Cardiac Enzymes: No results for input(s): CKTOTAL, CKMB, CKMBINDEX, TROPONINI in the last 168 hours. BNP (last 3 results) No results for input(s): PROBNP in the last 8760 hours. HbA1C: No results for input(s): HGBA1C in the last 72 hours. CBG: No results for input(s): GLUCAP in the last 168 hours. Lipid Profile: No results for input(s): CHOL, HDL, LDLCALC, TRIG, CHOLHDL, LDLDIRECT in the last 72 hours. Thyroid Function Tests: No results for input(s): TSH, T4TOTAL, FREET4, T3FREE, THYROIDAB in the last 72 hours. Anemia Panel: No results for input(s): VITAMINB12, FOLATE, FERRITIN, TIBC, IRON, RETICCTPCT in the last 72 hours. Urine analysis: No results found for: COLORURINE, APPEARANCEUR, LABSPEC, PHURINE, GLUCOSEU, HGBUR, BILIRUBINUR, KETONESUR, PROTEINUR, UROBILINOGEN, NITRITE, LEUKOCYTESUR  Radiological Exams on Admission: Ct Head Wo Contrast  Result Date: 12/17/2018 CLINICAL DATA:  Seizure, head trauma EXAM: CT HEAD WITHOUT CONTRAST TECHNIQUE: Contiguous axial images were obtained from the base of the skull through the vertex without intravenous contrast. COMPARISON:  MRI 09/18/2018  FINDINGS: Brain: No acute intracranial abnormality. Specifically, no hemorrhage, hydrocephalus, mass lesion, acute infarction, or significant intracranial injury. Vascular: No hyperdense vessel or unexpected calcification. Skull: No acute calvarial abnormality. Sinuses/Orbits: Visualized paranasal sinuses and mastoids clear. Orbital soft tissues unremarkable. Other: None IMPRESSION: No acute intracranial abnormality. Electronically Signed   By: Charlett Nose M.D.   On: 12/17/2018 20:08    EKG: Independently reviewed.  Assessment/Plan Active Problems:   Alcohol withdrawal seizure, with unspecified complication (HCC)    1. EtOH withdrawal seizure - also h/o seizure disorder 1. CIWA 2. Ativan Per CIWA pathway 3. Continue home keppra 4. Tele monitor 5. Seizure precautions  DVT prophylaxis: Lovenox Code Status: Full Family Communication: No family in room Disposition Plan: Home after admit Consults called: None Admission status: Admit to inpatient  Severity of Illness: The appropriate patient status for this patient is INPATIENT. Inpatient status is judged to be reasonable and necessary in order to provide the required intensity of service to  ensure the patient's safety. The patient's presenting symptoms, physical exam findings, and initial radiographic and laboratory data in the context of their chronic comorbidities is felt to place them at high risk for further clinical deterioration. Furthermore, it is not anticipated that the patient will be medically stable for discharge from the hospital within 2 midnights of admission. The following factors support the patient status of inpatient.   " The patient's presenting symptoms include Seizure. " The worrisome physical exam findings include Tremor, tachycardia, signs of EtOH withdrawal. " The initial radiographic and laboratory data are worrisome because of EtOH level of 0. " The chronic co-morbidities include Chronic EtOH abuse.   * I  certify that at the point of admission it is my clinical judgment that the patient will require inpatient hospital care spanning beyond 2 midnights from the point of admission due to high intensity of service, high risk for further deterioration and high frequency of surveillance required.*    GARDNER, JARED M. DO [facility]  How to contact the [facility] Attending or Consulting provider 7A - 7P or covering provider during after hours 7P -7A, for this patient?  1. Check the care team in [facility] and look for a) attending/consulting [facility] provider listed and b) the [facility] team listed 2. Log into [email or web address]  Amion Physician Scheduling and messaging for groups and whole hospitals  On call and physician scheduling software for group practices, residents, hospitalists and other medical providers for call, clinic, rotation and shift schedules. On[employer] is a hospital-wide system for scheduling doctors and paging doctors on call. EasyPlot is for scientific plotting and data analysis.  [email or web address]  and use [place]'s universal password to access. If you do not have the password, please contact the hospital operator.  3. Locate the [facility] provider you are looking for under [facility] and page to a number that you can be directly reached. 4. If you still have difficulty reaching the provider, please page the [facility] (Director on Call) for the Hospitalists listed on amion for assistance.  12/17/2018, 9:15 PM

## 2018-12-17 NOTE — ED Notes (Signed)
Report called to rn on 5 w 

## 2018-12-18 ENCOUNTER — Inpatient Hospital Stay (HOSPITAL_COMMUNITY): Payer: BLUE CROSS/BLUE SHIELD

## 2018-12-18 DIAGNOSIS — R509 Fever, unspecified: Secondary | ICD-10-CM

## 2018-12-18 LAB — COMPREHENSIVE METABOLIC PANEL
ALT: 77 U/L — ABNORMAL HIGH (ref 0–44)
AST: 138 U/L — ABNORMAL HIGH (ref 15–41)
Albumin: 3.8 g/dL (ref 3.5–5.0)
Alkaline Phosphatase: 35 U/L — ABNORMAL LOW (ref 38–126)
Anion gap: 14 (ref 5–15)
BUN: 8 mg/dL (ref 6–20)
CO2: 22 mmol/L (ref 22–32)
Calcium: 9.2 mg/dL (ref 8.9–10.3)
Chloride: 101 mmol/L (ref 98–111)
Creatinine, Ser: 0.95 mg/dL (ref 0.61–1.24)
GFR calc Af Amer: >60 mL/min (ref >60)
GFR calc non Af Amer: >60 mL/min (ref >60)
Glucose, Bld: 106 mg/dL — ABNORMAL HIGH (ref 70–99)
Potassium: 2.9 mmol/L — ABNORMAL LOW (ref 3.5–5.1)
Sodium: 137 mmol/L (ref 135–145)
Total Bilirubin: 1.7 mg/dL — ABNORMAL HIGH (ref 0.3–1.2)
Total Protein: 7.5 g/dL (ref 6.5–8.1)

## 2018-12-18 LAB — RESPIRATORY PANEL BY PCR
Adenovirus: NOT DETECTED
Bordetella pertussis: NOT DETECTED
Chlamydophila pneumoniae: NOT DETECTED
Coronavirus 229E: NOT DETECTED
Coronavirus HKU1: NOT DETECTED
Coronavirus NL63: NOT DETECTED
Coronavirus OC43: NOT DETECTED
INFLUENZA B-RVPPCR: NOT DETECTED
Influenza A: NOT DETECTED
METAPNEUMOVIRUS-RVPPCR: NOT DETECTED
Mycoplasma pneumoniae: NOT DETECTED
Parainfluenza Virus 1: NOT DETECTED
Parainfluenza Virus 2: NOT DETECTED
Parainfluenza Virus 3: NOT DETECTED
Parainfluenza Virus 4: NOT DETECTED
RESPIRATORY SYNCYTIAL VIRUS-RVPPCR: NOT DETECTED
Rhinovirus / Enterovirus: NOT DETECTED

## 2018-12-18 LAB — URINALYSIS, ROUTINE W REFLEX MICROSCOPIC
Bacteria, UA: NONE SEEN
Bilirubin Urine: NEGATIVE
GLUCOSE, UA: NEGATIVE mg/dL
Ketones, ur: NEGATIVE mg/dL
Leukocytes,Ua: NEGATIVE
Nitrite: NEGATIVE
PROTEIN: 100 mg/dL — AB
Specific Gravity, Urine: 1.03 (ref 1.005–1.030)
pH: 5 (ref 5.0–8.0)

## 2018-12-18 LAB — CBC
HCT: 39.9 % (ref 39.0–52.0)
Hemoglobin: 13.9 g/dL (ref 13.0–17.0)
MCH: 34.2 pg — ABNORMAL HIGH (ref 26.0–34.0)
MCHC: 34.8 g/dL (ref 30.0–36.0)
MCV: 98.3 fL (ref 80.0–100.0)
Platelets: DECREASED 10*3/uL (ref 150–400)
RBC: 4.06 MIL/uL — ABNORMAL LOW (ref 4.22–5.81)
RDW: 14 % (ref 11.5–15.5)
WBC: 4.8 10*3/uL (ref 4.0–10.5)
nRBC: 0 % (ref 0.0–0.2)

## 2018-12-18 LAB — LACTIC ACID, PLASMA
Lactic Acid, Venous: 2.2 mmol/L (ref 0.5–1.9)
Lactic Acid, Venous: 1.9 mmol/L (ref 0.5–1.9)

## 2018-12-18 MED ORDER — VANCOMYCIN HCL 10 G IV SOLR
2000.0000 mg | Freq: Once | INTRAVENOUS | Status: AC
  Administered 2018-12-18: 2000 mg via INTRAVENOUS
  Filled 2018-12-18: qty 2000

## 2018-12-18 MED ORDER — METOPROLOL TARTRATE 5 MG/5ML IV SOLN
5.0000 mg | Freq: Four times a day (QID) | INTRAVENOUS | Status: DC
  Administered 2018-12-18 – 2018-12-19 (×3): 5 mg via INTRAVENOUS
  Filled 2018-12-18 (×4): qty 5

## 2018-12-18 MED ORDER — SODIUM CHLORIDE 0.9 % IV SOLN
2.0000 g | INTRAVENOUS | Status: DC
  Administered 2018-12-18: 2 g via INTRAVENOUS
  Filled 2018-12-18 (×2): qty 20

## 2018-12-18 MED ORDER — VANCOMYCIN HCL 10 G IV SOLR
1500.0000 mg | Freq: Two times a day (BID) | INTRAVENOUS | Status: DC
  Administered 2018-12-19: 1500 mg via INTRAVENOUS
  Filled 2018-12-18 (×2): qty 1500

## 2018-12-18 MED ORDER — MAGNESIUM SULFATE 2 GM/50ML IV SOLN
2.0000 g | Freq: Once | INTRAVENOUS | Status: AC
  Administered 2018-12-18: 2 g via INTRAVENOUS
  Filled 2018-12-18: qty 50

## 2018-12-18 MED ORDER — METOPROLOL TARTRATE 5 MG/5ML IV SOLN
5.0000 mg | INTRAVENOUS | Status: DC | PRN
  Administered 2018-12-18 – 2018-12-21 (×5): 5 mg via INTRAVENOUS
  Filled 2018-12-18 (×5): qty 5

## 2018-12-18 MED ORDER — SODIUM CHLORIDE 0.9 % IV SOLN
INTRAVENOUS | Status: DC
  Administered 2018-12-18 – 2018-12-19 (×2): via INTRAVENOUS

## 2018-12-18 MED ORDER — METRONIDAZOLE IN NACL 5-0.79 MG/ML-% IV SOLN
500.0000 mg | Freq: Three times a day (TID) | INTRAVENOUS | Status: DC
  Administered 2018-12-18 – 2018-12-19 (×3): 500 mg via INTRAVENOUS
  Filled 2018-12-18 (×3): qty 100

## 2018-12-18 MED ORDER — LORAZEPAM 2 MG/ML IJ SOLN
1.0000 mg | INTRAMUSCULAR | Status: DC | PRN
  Administered 2018-12-20 (×2): 1 mg via INTRAVENOUS
  Filled 2018-12-18 (×2): qty 1

## 2018-12-18 MED ORDER — POTASSIUM CHLORIDE 10 MEQ/100ML IV SOLN
10.0000 meq | INTRAVENOUS | Status: AC
  Administered 2018-12-18 (×3): 10 meq via INTRAVENOUS
  Filled 2018-12-18 (×3): qty 100

## 2018-12-18 MED ORDER — PYRIDOXINE HCL 100 MG/ML IJ SOLN
100.0000 mg | Freq: Every day | INTRAMUSCULAR | Status: DC
  Administered 2018-12-18 – 2018-12-20 (×3): 100 mg via INTRAVENOUS
  Filled 2018-12-18 (×4): qty 1

## 2018-12-18 MED ORDER — IBUPROFEN 100 MG/5ML PO SUSP
600.0000 mg | Freq: Three times a day (TID) | ORAL | Status: DC | PRN
  Administered 2018-12-19 – 2018-12-20 (×3): 600 mg via ORAL
  Filled 2018-12-18 (×4): qty 30

## 2018-12-18 MED ORDER — LORAZEPAM 2 MG/ML IJ SOLN
2.0000 mg | INTRAMUSCULAR | Status: DC | PRN
  Administered 2018-12-18: 3 mg via INTRAVENOUS
  Administered 2018-12-18: 2 mg via INTRAVENOUS
  Administered 2018-12-18: 3 mg via INTRAVENOUS
  Administered 2018-12-18: 2 mg via INTRAVENOUS
  Administered 2018-12-18: 3 mg via INTRAVENOUS
  Administered 2018-12-18 – 2018-12-20 (×11): 2 mg via INTRAVENOUS
  Administered 2018-12-20: 1 mg via INTRAVENOUS
  Administered 2018-12-21 (×2): 2 mg via INTRAVENOUS
  Filled 2018-12-18: qty 2
  Filled 2018-12-18 (×5): qty 1
  Filled 2018-12-18: qty 2
  Filled 2018-12-18 (×2): qty 1
  Filled 2018-12-18: qty 2
  Filled 2018-12-18 (×12): qty 1

## 2018-12-18 MED ORDER — LEVETIRACETAM IN NACL 1000 MG/100ML IV SOLN
1000.0000 mg | Freq: Two times a day (BID) | INTRAVENOUS | Status: DC
  Administered 2018-12-18 – 2018-12-20 (×4): 1000 mg via INTRAVENOUS
  Filled 2018-12-18 (×5): qty 100

## 2018-12-18 NOTE — Progress Notes (Signed)
CRITICAL VALUE ALERT  Critical Value:  Lactic acid    2.2  Date & Time Notied:  12/18/2018   16:47 p.m.  Provider Notified: Dr. Jeoffrey Massed  Fluids & antibiotics ordered.

## 2018-12-18 NOTE — Progress Notes (Addendum)
Informed by RN that patient has been persistently febrile-confused, and now has had 7 loose watery stools today.  O/e Confused, somnolent Tachy to the 120's  A/P ETOH withdrawal-at risk for DT's Fever-?gastroenteritis now having diarrhea  ?URI-acknowledged URI symp this morning  ?Aspiration  Given persistent fever-start Vanco/Rocephin/Flagyl Await Stat CXR Resp virus panel, and blood cultures pending Have ordered GI pathogen panel as well.  Have asked RN to continue with IV Ativan per protocol-if inspite of escalating doses of IV ativan he is still very symptomatic-will consult PCCM for precedex infusion.  If fever persists will scan Chest/Abd

## 2018-12-18 NOTE — Progress Notes (Signed)
Called per floor RN to trouble shoot cooling blanket. Pt with persistent fever despite tylenol. Advised RN to use ice packs for now as cooling blanket isn't working at this time. Pt confused, agitated, HR 130-140s, BP stable 138/90 PO2 96 on RA. Lactic acid lab ordered per protocol. RN to update MD on labs and persistent fevers. Advised to call RRT as needed, will follow.

## 2018-12-18 NOTE — Progress Notes (Signed)
Pharmacy Antibiotic Note  Darren Diaz is a 33 y.o. male admitted on 12/17/2018 with seizures and alcohol withdrawal. While admitted, patient has been persistently febrile with Tmax 103.2. Antibiotics have been started for sepsis. Pharmacy has been consulted for vancomycin dosing. Patient also started on ceftriaxone and flagyl per MD. WBC is wnl and Scr is around baseline at 0.95   Plan: Vancomycin 2000 mg IV x1, then 1500 mg IV Q 12 hrs. Goal AUC 400-550. Expected AUC: 456.8 SCr used: 0.95 CTX 2 g IV q24h per MD Flagyl 500 mg IV q8h per MD Monitor renal function, clinical improvement, C&S, vanc levels prn   Height: 5\' 10"  (177.8 cm) Weight: 204 lb 9.4 oz (92.8 kg) IBW/kg (Calculated) : 73  Temp (24hrs), Avg:101.8 F (38.8 C), Min:100 F (37.8 C), Max:103.2 F (39.6 C)  Recent Labs  Lab 12/17/18 1850 12/18/18 1321  WBC 5.0 4.8  CREATININE 0.75 0.95    Estimated Creatinine Clearance: 125.4 mL/min (by C-G formula based on SCr of 0.95 mg/dL).    No Known Allergies  Antimicrobials this admission: Vanc 3/8 >>  CTX 3/8 >>  Flagyl 3/8 >>  Dose adjustments this admission: N/A  Microbiology results: 3/8 BCx: in process  Thank you for allowing pharmacy to be a part of this patient's care.  Arvilla Market, PharmD PGY1 Pharmacy Resident Phone [phone] 12/18/2018     3:37 PM

## 2018-12-18 NOTE — Progress Notes (Addendum)
PROGRESS NOTE        PATIENT DETAILS Name: Darren Diaz Age: 33 y.o. Sex: male Date of Birth: 10-27-1985 Admit Date: 12/17/2018 Admitting Physician Hillary Bow, DO [id number], No Pcp Per  Brief Narrative: Patient is a 33 y.o. male with history of alcohol use, seizure disorder-presenting with breakthrough seizure. Apparently patient has been cutting down his ETOH use.  See below for further details  Subjective: Febrile this morning-denies any headache, nausea vomiting.  Acknowledges nasal congestion/runny nose.  Patient tremulous-but able to answer most of my questions appropriately.  Claims last drink was on 3/7  Assessment/Plan: Seizure: Likely in the setting of alcohol withdrawal-claims he has been cutting back.  He also claims that he has been compliant with Keppra.  CT head without any acute abnormalities.  Continue Ativan per protocol and Keppra.  If seizures recur-we will officially consult neurology.  Alcohol withdrawal: Already very tremulous-continue Ativan per protocol-last drink was on 3/7.  Continue MVI, thiamine, folic acid.  Fever: Does acknowledge some URI symptoms-check respiratory virus panel.  No other foci apparent on exam-neck is supple, denies headache.  Have ordered blood cultures-and a repeat chest x-ray to rule out aspiration.  Also febrile-he remained stable-we will hold off on starting antimicrobial agents.  If fever continues-may need further work-up/ID evaluation.  No travel history.  DVT Prophylaxis: Prophylactic Lovenox   Code Status: Full code  Family Communication: None at bedside  Disposition Plan: Remain inpatient  Antimicrobial agents: Anti-infectives (From admission, onward)   None     Procedures: None  CONSULTS:  None  Time spent: 35- minutes-Greater than 50% of this time was spent in counseling, explanation of diagnosis, planning of further management, and coordination of  care.  MEDICATIONS: Scheduled Meds: . enoxaparin (LOVENOX) injection  40 mg Subcutaneous Q24H  . feeding supplement (ENSURE ENLIVE)  237 mL Oral BID BM  . folic acid  1 mg Oral Daily  . gabapentin  300 mg Oral BID  . levETIRAcetam  1,000 mg Oral BID  . LORazepam  0-4 mg Intravenous Q6H   Or  . LORazepam  0-4 mg Oral Q6H  . [START ON 12/20/2018] LORazepam  0-4 mg Intravenous Q12H   Or  . [START ON 12/20/2018] LORazepam  0-4 mg Oral Q12H  . multivitamin with minerals  1 tablet Oral Daily  . pyridOXINE  100 mg Intravenous Daily  . QUEtiapine  50 mg Oral QHS  . thiamine  100 mg Oral Daily   Or  . thiamine  100 mg Intravenous Daily   Continuous Infusions: PRN Meds:.acetaminophen **OR** acetaminophen, LORazepam **OR** LORazepam, metoprolol tartrate, ondansetron **OR** ondansetron (ZOFRAN) IV   PHYSICAL EXAM: Vital signs: Vitals:   12/18/18 0637 12/18/18 0638 12/18/18 0642 12/18/18 0732  BP:  (!) 153/101 (!) 129/98   Pulse:  (!) 114 (!) 121   Resp:      Temp: (!) 102 F (38.9 C)   (!) 101.7 F (38.7 C)  TempSrc: Rectal   Rectal  SpO2:  97%    Weight:      Height:       Filed Weights   12/17/18 2300  Weight: 92.8 kg   Body mass index is 29.36 kg/m.   General appearance :Awake, alert, not in any distress.  HEENT: Atraumatic and Normocephalic Neck: supple, no JVD. Resp:Good air entry bilaterally, no added sounds  CVS:  S1 S2 regular, no murmurs.  GI: Bowel sounds present, Non tender and not distended with no gaurding, rigidity or rebound.No organomegaly Extremities: B/L Lower Ext shows no edema, both legs are warm to touch Neurology:  speech clear,Non focal, sensation is grossly intact. Musculoskeletal:No digital cyanosis Skin:No Rash, warm and dry Wounds:N/A  I have personally reviewed following labs and imaging studies  LABORATORY DATA: CBC: Recent Labs  Lab 12/17/18 1850  WBC 5.0  HGB 13.5  HCT 39.2  MCV 100.5*  PLT PLATELET CLUMPS NOTED ON SMEAR, COUNT  APPEARS DECREASED    Basic Metabolic Panel: Recent Labs  Lab 12/17/18 1850  NA 137  K 3.6  CL 101  CO2 19*  GLUCOSE 165*  BUN 6  CREATININE 0.75  CALCIUM 9.6    GFR: Estimated Creatinine Clearance: 148.9 mL/min (by C-G formula based on SCr of 0.75 mg/dL).  Liver Function Tests: Recent Labs  Lab 12/17/18 1850  AST 83*  ALT 51*  ALKPHOS 58  BILITOT 1.3*  PROT 7.7  ALBUMIN 4.3   No results for input(s): LIPASE, AMYLASE in the last 168 hours. No results for input(s): AMMONIA in the last 168 hours.  Coagulation Profile: No results for input(s): INR, PROTIME in the last 168 hours.  Cardiac Enzymes: No results for input(s): CKTOTAL, CKMB, CKMBINDEX, TROPONINI in the last 168 hours.  BNP (last 3 results) No results for input(s): PROBNP in the last 8760 hours.  HbA1C: No results for input(s): HGBA1C in the last 72 hours.  CBG: No results for input(s): GLUCAP in the last 168 hours.  Lipid Profile: No results for input(s): CHOL, HDL, LDLCALC, TRIG, CHOLHDL, LDLDIRECT in the last 72 hours.  Thyroid Function Tests: No results for input(s): TSH, T4TOTAL, FREET4, T3FREE, THYROIDAB in the last 72 hours.  Anemia Panel: No results for input(s): VITAMINB12, FOLATE, FERRITIN, TIBC, IRON, RETICCTPCT in the last 72 hours.  Urine analysis: No results found for: COLORURINE, APPEARANCEUR, LABSPEC, PHURINE, GLUCOSEU, HGBUR, BILIRUBINUR, KETONESUR, PROTEINUR, UROBILINOGEN, NITRITE, LEUKOCYTESUR  Sepsis Labs: Lactic Acid, Venous    Component Value Date/Time   LATICACIDVEN 1.6 09/09/2018 0657    MICROBIOLOGY: No results found for this or any previous visit (from the past 240 hour(s)).  RADIOLOGY STUDIES/RESULTS: Dg Chest 2 View  Result Date: 12/17/2018 CLINICAL DATA:  Seizure, fever EXAM: CHEST - 2 VIEW COMPARISON:  09/09/2018 chest radiograph. FINDINGS: Stable cardiomediastinal silhouette with normal heart size. No pneumothorax. No pleural effusion. Lungs appear clear,  with no acute consolidative airspace disease and no pulmonary edema. IMPRESSION: No active cardiopulmonary disease. Electronically Signed   By: Delbert Phenix M.D.   On: 12/17/2018 20:47   Ct Head Wo Contrast  Result Date: 12/17/2018 CLINICAL DATA:  Seizure, head trauma EXAM: CT HEAD WITHOUT CONTRAST TECHNIQUE: Contiguous axial images were obtained from the base of the skull through the vertex without intravenous contrast. COMPARISON:  MRI 09/18/2018 FINDINGS: Brain: No acute intracranial abnormality. Specifically, no hemorrhage, hydrocephalus, mass lesion, acute infarction, or significant intracranial injury. Vascular: No hyperdense vessel or unexpected calcification. Skull: No acute calvarial abnormality. Sinuses/Orbits: Visualized paranasal sinuses and mastoids clear. Orbital soft tissues unremarkable. Other: None IMPRESSION: No acute intracranial abnormality. Electronically Signed   By: Charlett Nose M.D.   On: 12/17/2018 20:08     LOS: 1 day   Jeoffrey Massed, MD  [facility]  If 7PM-7AM, please contact night-coverage  Please page via [email or web address]  Go to [email or web address] and use [facility] universal password to access. If you do not have the  password, please contact the hospital operator.  Locate the [facility] provider you are looking for under [facility] and page to a number that you can be directly reached. If you still have difficulty reaching the provider, please page the [facility] (Director on Call) for the Hospitalists listed on amion for assistance.  12/18/2018, 9:34 AM

## 2018-12-19 ENCOUNTER — Inpatient Hospital Stay (HOSPITAL_COMMUNITY): Payer: BLUE CROSS/BLUE SHIELD

## 2018-12-19 DIAGNOSIS — R740 Nonspecific elevation of levels of transaminase and lactic acid dehydrogenase [LDH]: Secondary | ICD-10-CM

## 2018-12-19 DIAGNOSIS — B951 Streptococcus, group B, as the cause of diseases classified elsewhere: Secondary | ICD-10-CM

## 2018-12-19 DIAGNOSIS — Z978 Presence of other specified devices: Secondary | ICD-10-CM

## 2018-12-19 DIAGNOSIS — R7881 Bacteremia: Secondary | ICD-10-CM

## 2018-12-19 DIAGNOSIS — L03115 Cellulitis of right lower limb: Secondary | ICD-10-CM

## 2018-12-19 DIAGNOSIS — F10239 Alcohol dependence with withdrawal, unspecified: Secondary | ICD-10-CM

## 2018-12-19 DIAGNOSIS — R197 Diarrhea, unspecified: Secondary | ICD-10-CM

## 2018-12-19 DIAGNOSIS — F17210 Nicotine dependence, cigarettes, uncomplicated: Secondary | ICD-10-CM

## 2018-12-19 DIAGNOSIS — B950 Streptococcus, group A, as the cause of diseases classified elsewhere: Secondary | ICD-10-CM | POA: Diagnosis present

## 2018-12-19 DIAGNOSIS — Z8669 Personal history of other diseases of the nervous system and sense organs: Secondary | ICD-10-CM

## 2018-12-19 LAB — COMPREHENSIVE METABOLIC PANEL
ALT: 750 U/L — ABNORMAL HIGH (ref 0–44)
AST: 2241 U/L — ABNORMAL HIGH (ref 15–41)
Albumin: 3.2 g/dL — ABNORMAL LOW (ref 3.5–5.0)
Alkaline Phosphatase: 23 U/L — ABNORMAL LOW (ref 38–126)
Anion gap: 11 (ref 5–15)
BUN: 10 mg/dL (ref 6–20)
CO2: 22 mmol/L (ref 22–32)
Calcium: 8 mg/dL — ABNORMAL LOW (ref 8.9–10.3)
Chloride: 103 mmol/L (ref 98–111)
Creatinine, Ser: 0.99 mg/dL (ref 0.61–1.24)
GFR calc Af Amer: >60 mL/min (ref >60)
GFR calc non Af Amer: >60 mL/min (ref >60)
Glucose, Bld: 96 mg/dL (ref 70–99)
POTASSIUM: 3.2 mmol/L — AB (ref 3.5–5.1)
Sodium: 136 mmol/L (ref 135–145)
Total Bilirubin: 1.6 mg/dL — ABNORMAL HIGH (ref 0.3–1.2)
Total Protein: 6.5 g/dL (ref 6.5–8.1)

## 2018-12-19 LAB — MAGNESIUM: MAGNESIUM: 1.7 mg/dL (ref 1.7–2.4)

## 2018-12-19 LAB — BLOOD CULTURE ID PANEL (REFLEXED)

## 2018-12-19 LAB — CBC
HCT: 37.7 % — ABNORMAL LOW (ref 39.0–52.0)
Hemoglobin: 12.7 g/dL — ABNORMAL LOW (ref 13.0–17.0)
MCH: 34 pg (ref 26.0–34.0)
MCHC: 33.7 g/dL (ref 30.0–36.0)
MCV: 100.8 fL — ABNORMAL HIGH (ref 80.0–100.0)
Platelets: 53 10*3/uL — ABNORMAL LOW (ref 150–400)
RBC: 3.74 MIL/uL — ABNORMAL LOW (ref 4.22–5.81)
RDW: 14.1 % (ref 11.5–15.5)
WBC: 6.9 10*3/uL (ref 4.0–10.5)
nRBC: 0 % (ref 0.0–0.2)

## 2018-12-19 LAB — ECHOCARDIOGRAM COMPLETE
Height: 70 in
Weight: 3273.39 oz

## 2018-12-19 LAB — PROTIME-INR
INR: 1.5 — ABNORMAL HIGH (ref 0.8–1.2)
Prothrombin Time: 17.6 seconds — ABNORMAL HIGH (ref 11.4–15.2)

## 2018-12-19 LAB — ACETAMINOPHEN LEVEL: Acetaminophen (Tylenol), Serum: <10 ug/mL — ABNORMAL LOW (ref 10–30)

## 2018-12-19 LAB — AMMONIA: Ammonia: 28 umol/L (ref 9–35)

## 2018-12-19 MED ORDER — MAGNESIUM SULFATE 2 GM/50ML IV SOLN
2.0000 g | Freq: Once | INTRAVENOUS | Status: AC
  Administered 2018-12-19: 2 g via INTRAVENOUS
  Filled 2018-12-19: qty 50

## 2018-12-19 MED ORDER — POTASSIUM CHLORIDE IN NACL 40-0.9 MEQ/L-% IV SOLN
INTRAVENOUS | Status: DC
  Administered 2018-12-19 (×2): 125 mL/h via INTRAVENOUS
  Administered 2018-12-20: 75 mL/h via INTRAVENOUS
  Filled 2018-12-19 (×5): qty 1000

## 2018-12-19 MED ORDER — POTASSIUM CHLORIDE 10 MEQ/100ML IV SOLN
10.0000 meq | INTRAVENOUS | Status: AC
  Administered 2018-12-19 (×2): 10 meq via INTRAVENOUS
  Filled 2018-12-19 (×2): qty 100

## 2018-12-19 MED ORDER — SODIUM CHLORIDE 0.9 % IV SOLN: INTRAVENOUS | Status: DC

## 2018-12-19 MED ORDER — PENICILLIN G POTASSIUM 20000000 UNITS IJ SOLR
12.0000 10*6.[IU] | Freq: Two times a day (BID) | INTRAVENOUS | Status: DC
  Administered 2018-12-19 – 2018-12-28 (×18): 12 10*6.[IU] via INTRAVENOUS
  Filled 2018-12-19 (×21): qty 12

## 2018-12-19 MED ORDER — NICOTINE 21 MG/24HR TD PT24
21.0000 mg | MEDICATED_PATCH | Freq: Every day | TRANSDERMAL | Status: DC
  Administered 2018-12-19 – 2018-12-31 (×13): 21 mg via TRANSDERMAL
  Filled 2018-12-19 (×14): qty 1

## 2018-12-19 MED ORDER — METOPROLOL TARTRATE 5 MG/5ML IV SOLN: 5.0000 mg | Freq: Three times a day (TID) | INTRAVENOUS | Status: DC

## 2018-12-19 NOTE — Progress Notes (Signed)
Pt unavailable this afternoon. Pt is busy with other procedures. Will attempt at a later time when schedule permits

## 2018-12-19 NOTE — Plan of Care (Signed)

## 2018-12-19 NOTE — Progress Notes (Signed)
  Echocardiogram 2D Echocardiogram has been performed.  Darren Diaz 12/19/2018, 3:57 PM

## 2018-12-19 NOTE — Progress Notes (Signed)
PHARMACY - PHYSICIAN COMMUNICATION CRITICAL VALUE ALERT - BLOOD CULTURE IDENTIFICATION (BCID)  Darren Diaz is an 33 y.o. male who presented to [facility] on 12/17/2018 with a chief complaint of seizure/alcohol withdrawal. Pt remains febrile today.   Name of physician (or Provider) Contacted: [place]heimer   Current antibiotics: Vancomycin/Ceftriaxone/Flagyl   Changes to prescribed antibiotics recommended:  Consider de-escalating later today if rounding team thinks pt is improving clinically (currently remains febrile and tachycardic)  Results for orders placed or performed during the hospital encounter of 12/17/18  Blood Culture ID Panel (Reflexed) (Collected: 12/18/2018  1:00 PM)  Result Value Ref Range   Enterococcus species NOT DETECTED NOT DETECTED   Listeria monocytogenes NOT DETECTED NOT DETECTED   Staphylococcus species NOT DETECTED NOT DETECTED   Staphylococcus aureus (BCID) NOT DETECTED NOT DETECTED   Streptococcus species DETECTED (A) NOT DETECTED   Streptococcus agalactiae NOT DETECTED NOT DETECTED   Streptococcus pneumoniae NOT DETECTED NOT DETECTED   Streptococcus pyogenes DETECTED (A) NOT DETECTED   Acinetobacter baumannii NOT DETECTED NOT DETECTED   Enterobacteriaceae species NOT DETECTED NOT DETECTED   Enterobacter cloacae complex NOT DETECTED NOT DETECTED   Escherichia coli NOT DETECTED NOT DETECTED   Klebsiella oxytoca NOT DETECTED NOT DETECTED   Klebsiella pneumoniae NOT DETECTED NOT DETECTED   Proteus species NOT DETECTED NOT DETECTED   Serratia marcescens NOT DETECTED NOT DETECTED   Haemophilus influenzae NOT DETECTED NOT DETECTED   Neisseria meningitidis NOT DETECTED NOT DETECTED   Pseudomonas aeruginosa NOT DETECTED NOT DETECTED   Candida albicans NOT DETECTED NOT DETECTED   Candida glabrata NOT DETECTED NOT DETECTED   Candida krusei NOT DETECTED NOT DETECTED   Candida parapsilosis NOT DETECTED NOT DETECTED   Candida tropicalis NOT DETECTED NOT DETECTED     Abran Duke 12/19/2018  5:10 AM

## 2018-12-19 NOTE — Progress Notes (Addendum)
PROGRESS NOTE        PATIENT DETAILS Name: Darren Diaz Age: 33 y.o. Sex: male Date of Birth: August 04, 1986 Admit Date: 12/17/2018 Admitting Physician Hillary Bow, DO YQM:VHQIONG, No Pcp Per  Brief Narrative: Patient is a 33 y.o. male with history of alcohol use, seizure disorder-presenting with breakthrough seizure. Apparently patient has been cutting down his ETOH use.  See below for further details  Subjective: Febrile overnight-but fever curve is slightly improved.  He is lethargic but still able to answer most of my questions appropriately.  Continues to have loose stools.  His mother is at bedside.  Assessment/Plan: Sepsis secondary to Streptococcus pyogenes bacteremia and right lower extremity cellulitis: Fever curve slowly improving-initially on IV vancomycin/Rocephin/Flagyl as foci of infection was not clear-but since blood cultures are positive for Streptococcus pyogenes-have discontinued vancomycin and Flagyl.  Difficult exam given mental status-but he appears to have right lower extremity cellulitis.  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  Respiratory virus panel negative, since having diarrhea-pending GI pathogen panel.Have consulted infectious disease as well.  Acute metabolic encephalopathy: Multifactorial-secondary to sepsis in the setting of bacteremia and alcohol withdrawal.  He is lethargic-mumbles-but is able to answer most of my questions appropriately.  He is moving all of his 4 extremities.  CT head on admission was negative.  Hoping that with treatment of sepsis, and ongoing treatment of call withdrawal-that he will slowly improve over the next few days.  Will check ammonia levels as well-given longstanding history of alcohol use  Seizure disorder: Mother thinks patient is noncompliant with Keppra-continue IV Keppra-Ativan per [facility] protocol.  Check  EEG-to r/o nonconvulsive seizures-although I suspect encephalopathy is from the above 2 causes.  Transaminitis: Significant increase in AST and ALT levels-suspect secondary to bacteremia/sepsis-patient was persistently febrile yesterday-although blood pressure did not drop.  RUQ exam is nontender.  Check hepatitis serology, RUQ ultrasound and Tylenol level.  INR is minimally elevated at 1.5.  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  Follow LFTs closely.  Alcohol withdrawal: Already very tremulous-continue Ativan per protocol-last drink was on 3/7.  Continue MVI, thiamine, folic acid.  Hypokalemia/hypomagnesemia: Replete and recheck  DVT Prophylaxis: Prophylactic Lovenox   Code Status: Full code  Family Communication: None at bedside  Disposition Plan: Remain inpatient  Antimicrobial agents: Anti-infectives (From admission, onward)   Start     Dose/Rate Route Frequency Ordered Stop   12/19/18 0400  vancomycin (VANCOCIN) 1,500 mg in sodium chloride 0.9 % 500 mL IVPB  Status:  Discontinued     1,500 mg 250 mL/hr over 120 Minutes Intravenous Every 12 hours 12/18/18 1530 12/19/18 0901   12/18/18 1600  vancomycin (VANCOCIN) 2,000 mg in sodium chloride 0.9 % 500 mL IVPB     2,000 mg 250 mL/hr over 120 Minutes Intravenous  Once 12/18/18 1530 12/19/18 0748   12/18/18 1530  cefTRIAXone (ROCEPHIN) 2 g in sodium chloride 0.9 % 100 mL IVPB     2 g 200 mL/hr over 30 Minutes Intravenous Every 24 hours 12/18/18 1519     12/18/18 1530  metroNIDAZOLE (FLAGYL) IVPB 500 mg  Status:  Discontinued     500 mg 100 mL/hr over 60 Minutes Intravenous Every 8 hours 12/18/18 1519 12/19/18 0901  Procedures: None  CONSULTS:  None  Time spent: 35- minutes-Greater than 50% of this time was spent in counseling, explanation of diagnosis, planning of further management, and coordination of care.  MEDICATIONS: Scheduled Meds: . enoxaparin (LOVENOX)  injection  40 mg Subcutaneous Q24H  . feeding supplement (ENSURE ENLIVE)  237 mL Oral BID BM  . folic acid  1 mg Oral Daily  . gabapentin  300 mg Oral BID  . metoprolol tartrate  5 mg Intravenous Q6H  . multivitamin with minerals  1 tablet Oral Daily  . pyridOXINE  100 mg Intravenous Daily  . QUEtiapine  50 mg Oral QHS  . thiamine  100 mg Oral Daily   Or  . thiamine  100 mg Intravenous Daily   Continuous Infusions: . 0.9 % NaCl with KCl 40 mEq / L 125 mL/hr (12/19/18 0815)  . cefTRIAXone (ROCEPHIN)  IV 2 g (12/18/18 1802)  . levETIRAcetam 1,000 mg (12/19/18 0358)   PRN Meds:.acetaminophen **OR** acetaminophen, ibuprofen, LORazepam, LORazepam, metoprolol tartrate, ondansetron **OR** ondansetron (ZOFRAN) IV   PHYSICAL EXAM: Vital signs: Vitals:   12/19/18 0403 12/19/18 0410 12/19/18 0510 12/19/18 0610  BP: (!) 136/100 (!) 132/106 127/86 119/78  Pulse: (!) 113 (!) 116 (!) 125 (!) 113  Resp:  (!) 26 (!) 21 19  Temp:   99.1 F (37.3 C)   TempSrc:   Oral   SpO2:  100% 98% 98%  Weight:      Height:       Filed Weights   12/17/18 2300  Weight: 92.8 kg   Body mass index is 29.36 kg/m.   General appearance: Lethargic but arousable-able to answer most of my questions appropriately Eyes:no scleral icterus. HEENT: Atraumatic and Normocephalic Neck: supple Resp:Good air entry bilaterally,no added sounds heard anteriorly CVS: S1 S2 regular, tachycardic GI: Bowel sounds present, Non tender and not distended with no gaurding, rigidity or rebound. Extremities: B/L Lower Ext shows no edema, both legs are warm to touch.  Has developed a erythematous/cellulitic area in the right lower extremity. Neurology:  Non focal Psychiatric: Normal judgment and insight. Normal mood. Musculoskeletal:No digital cyanosis Skin:No Rash, warm and dry Wounds:N/A  I have personally reviewed following labs and imaging studies  LABORATORY DATA: CBC: Recent Labs  Lab 12/17/18 1850 12/18/18 1321  12/19/18 0356  WBC 5.0 4.8 6.9  HGB 13.5 13.9 12.7*  HCT 39.2 39.9 37.7*  MCV 100.5* 98.3 100.8*  PLT PLATELET CLUMPS NOTED ON SMEAR, COUNT APPEARS DECREASED PLATELET CLUMPS NOTED ON SMEAR, COUNT APPEARS DECREASED 53*    Basic Metabolic Panel: Recent Labs  Lab 12/17/18 1850 12/18/18 1321 12/19/18 0356  NA 137 137 136  K 3.6 2.9* 3.2*  CL 101 101 103  CO2 19* 22 22  GLUCOSE 165* 106* 96  BUN 6 8 10   CREATININE 0.75 0.95 0.99  CALCIUM 9.6 9.2 8.0*  MG  --   --  1.7    GFR: Estimated Creatinine Clearance: 120.3 mL/min (by C-G formula based on SCr of 0.99 mg/dL).  Liver Function Tests: Recent Labs  Lab 12/17/18 1850 12/18/18 1321 12/19/18 0356  AST 83* 138* 2,241*  ALT 51* 77* 750*  ALKPHOS 58 35* 23*  BILITOT 1.3* 1.7* 1.6*  PROT 7.7 7.5 6.5  ALBUMIN 4.3 3.8 3.2*   No results for input(s): LIPASE, AMYLASE in the last 168 hours. No results for input(s): AMMONIA in the last 168 hours.  Coagulation Profile: Recent Labs  Lab 12/19/18 0702  INR 1.5*    Cardiac  Enzymes: No results for input(s): CKTOTAL, CKMB, CKMBINDEX, TROPONINI in the last 168 hours.  BNP (last 3 results) No results for input(s): PROBNP in the last 8760 hours.  HbA1C: No results for input(s): HGBA1C in the last 72 hours.  CBG: No results for input(s): GLUCAP in the last 168 hours.  Lipid Profile: No results for input(s): CHOL, HDL, LDLCALC, TRIG, CHOLHDL, LDLDIRECT in the last 72 hours.  Thyroid Function Tests: No results for input(s): TSH, T4TOTAL, FREET4, T3FREE, THYROIDAB in the last 72 hours.  Anemia Panel: No results for input(s): VITAMINB12, FOLATE, FERRITIN, TIBC, IRON, RETICCTPCT in the last 72 hours.  Urine analysis:    Component Value Date/Time   COLORURINE AMBER (A) 12/18/2018 2155   APPEARANCEUR HAZY (A) 12/18/2018 2155   LABSPEC 1.030 12/18/2018 2155   PHURINE 5.0 12/18/2018 2155   GLUCOSEU NEGATIVE 12/18/2018 2155   HGBUR SMALL (A) 12/18/2018 2155   BILIRUBINUR  NEGATIVE 12/18/2018 2155   KETONESUR NEGATIVE 12/18/2018 2155   PROTEINUR 100 (A) 12/18/2018 2155   NITRITE NEGATIVE 12/18/2018 2155   LEUKOCYTESUR NEGATIVE 12/18/2018 2155    Sepsis Labs: Lactic Acid, Venous    Component Value Date/Time   LATICACIDVEN 1.9 12/18/2018 1912    MICROBIOLOGY: Recent Results (from the past 240 hour(s))  Respiratory Panel by PCR     Status: None   Collection Time: 12/18/18 12:18 PM  Result Value Ref Range Status   Adenovirus NOT DETECTED NOT DETECTED Final   Coronavirus 229E NOT DETECTED NOT DETECTED Final    Comment: (NOTE) The Coronavirus on the Respiratory Panel, DOES NOT test for the novel  Coronavirus (2019 nCoV)    Coronavirus HKU1 NOT DETECTED NOT DETECTED Final   Coronavirus NL63 NOT DETECTED NOT DETECTED Final   Coronavirus OC43 NOT DETECTED NOT DETECTED Final   Metapneumovirus NOT DETECTED NOT DETECTED Final   Rhinovirus / Enterovirus NOT DETECTED NOT DETECTED Final   Influenza A NOT DETECTED NOT DETECTED Final   Influenza B NOT DETECTED NOT DETECTED Final   Parainfluenza Virus 1 NOT DETECTED NOT DETECTED Final   Parainfluenza Virus 2 NOT DETECTED NOT DETECTED Final   Parainfluenza Virus 3 NOT DETECTED NOT DETECTED Final   Parainfluenza Virus 4 NOT DETECTED NOT DETECTED Final   Respiratory Syncytial Virus NOT DETECTED NOT DETECTED Final   Bordetella pertussis NOT DETECTED NOT DETECTED Final   Chlamydophila pneumoniae NOT DETECTED NOT DETECTED Final   Mycoplasma pneumoniae NOT DETECTED NOT DETECTED Final    Comment: Performed at [facility], [address] [place], [place] [address]  Culture, blood (routine x 2)     Status: None (Preliminary result)   Collection Time: 12/18/18  1:00 PM  Result Value Ref Range Status   Specimen Description BLOOD LEFT HAND  Final   Special Requests   Final    BOTTLES DRAWN AEROBIC ONLY Blood Culture adequate volume   Culture  Setup Time   Final    GRAM POSITIVE COCCI IN CHAINS AEROBIC BOTTLE  ONLY CRITICAL RESULT CALLED TO, READ BACK BY AND VERIFIED WITH: J.LADFORD,PHARMD AT 0450 ON 12/19/18 BY G.MCADOO Performed at [facility], [address] [place], [place] [address]    Culture GRAM POSITIVE COCCI IN CHAINS  Final   Report Status PENDING  Incomplete  Blood Culture ID Panel (Reflexed)     Status: Abnormal   Collection Time: 12/18/18  1:00 PM  Result Value Ref Range Status   Enterococcus species NOT DETECTED NOT DETECTED Final   Listeria  monocytogenes NOT DETECTED NOT DETECTED Final   Staphylococcus species NOT DETECTED NOT DETECTED Final   Staphylococcus aureus (BCID) NOT DETECTED NOT DETECTED Final   Streptococcus species DETECTED (A) NOT DETECTED Final    Comment: CRITICAL RESULT CALLED TO, READ BACK BY AND VERIFIED WITH: J.LADFORD,PHARMD AT 0450 ON 12/19/18 BY G.MCADOO    Streptococcus agalactiae NOT DETECTED NOT DETECTED Final   Streptococcus pneumoniae NOT DETECTED NOT DETECTED Final   Streptococcus pyogenes DETECTED (A) NOT DETECTED Final    Comment: CRITICAL RESULT CALLED TO, READ BACK BY AND VERIFIED WITH: J.LADFORD,PHARMD AT 0450 ON 12/19/18 BY G.MCADOO    Acinetobacter baumannii NOT DETECTED NOT DETECTED Final   Enterobacteriaceae species NOT DETECTED NOT DETECTED Final   Enterobacter cloacae complex NOT DETECTED NOT DETECTED Final   Escherichia coli NOT DETECTED NOT DETECTED Final   Klebsiella oxytoca NOT DETECTED NOT DETECTED Final   Klebsiella pneumoniae NOT DETECTED NOT DETECTED Final   Proteus species NOT DETECTED NOT DETECTED Final   Serratia marcescens NOT DETECTED NOT DETECTED Final   Haemophilus influenzae NOT DETECTED NOT DETECTED Final   Neisseria meningitidis NOT DETECTED NOT DETECTED Final   Pseudomonas aeruginosa NOT DETECTED NOT DETECTED Final   Candida albicans NOT DETECTED NOT DETECTED Final   Candida glabrata NOT DETECTED NOT DETECTED Final   Candida krusei NOT DETECTED NOT DETECTED Final   Candida parapsilosis NOT DETECTED NOT DETECTED  Final   Candida tropicalis NOT DETECTED NOT DETECTED Final    Comment: Performed at [facility], [address] [place], [place] [address]  Culture, blood (routine x 2)     Status: None (Preliminary result)   Collection Time: 12/18/18  1:20 PM  Result Value Ref Range Status   Specimen Description BLOOD RIGHT HAND  Final   Special Requests   Final    BOTTLES DRAWN AEROBIC ONLY Blood Culture results may not be optimal due to an inadequate volume of blood received in culture bottles   Culture  Setup Time   Final    GRAM POSITIVE COCCI IN CHAINS AEROBIC BOTTLE ONLY CRITICAL VALUE NOTED.  VALUE IS CONSISTENT WITH PREVIOUSLY REPORTED AND CALLED VALUE. Performed at [facility], [address], [place], [place] [address]    Culture GRAM POSITIVE COCCI IN CHAINS  Final   Report Status PENDING  Incomplete    RADIOLOGY STUDIES/RESULTS: Dg Chest 2 View  Result Date: 12/17/2018 CLINICAL DATA:  Seizure, fever EXAM: CHEST - 2 VIEW COMPARISON:  09/09/2018 chest radiograph. FINDINGS: Stable cardiomediastinal silhouette with normal heart size. No pneumothorax. No pleural effusion. Lungs appear clear, with no acute consolidative airspace disease and no pulmonary edema. IMPRESSION: No active cardiopulmonary disease. Electronically Signed   By: Delbert Phenix M.D.   On: 12/17/2018 20:47   Ct Head Wo Contrast  Result Date: 12/17/2018 CLINICAL DATA:  Seizure, head trauma EXAM: CT HEAD WITHOUT CONTRAST TECHNIQUE: Contiguous axial images were obtained from the base of the skull through the vertex without intravenous contrast. COMPARISON:  MRI 09/18/2018 FINDINGS: Brain: No acute intracranial abnormality. Specifically, no hemorrhage, hydrocephalus, mass lesion, acute infarction, or significant intracranial injury. Vascular: No hyperdense vessel or unexpected calcification. Skull: No acute calvarial abnormality. Sinuses/Orbits: Visualized paranasal sinuses and mastoids clear. Orbital soft tissues  unremarkable. Other: None IMPRESSION: No acute intracranial abnormality. Electronically Signed   By: Charlett Nose M.D.   On: 12/17/2018 20:08   Dg Chest Port 1v Same Day  Result Date: 12/18/2018 CLINICAL DATA:  Shortness of breath EXAM: PORTABLE  CHEST 1 VIEW COMPARISON:  December 17, 2018 FINDINGS: The heart size and mediastinal contours are within normal limits. Both lungs are clear. The visualized skeletal structures are unremarkable. IMPRESSION: No active disease. Electronically Signed   By: Gerome Sam III M.D   On: 12/18/2018 16:01     LOS: 2 days   Jeoffrey Massed, MD  [facility]  If 7PM-7AM, please contact night-coverage  Please page via [email or web address]  Go to [email or web address] and use [facility]'s universal password to access. If you do not have the password, please contact the hospital operator.  Locate the [facility] provider you are looking for under [facility] and page to a number that you can be directly reached. If you still have difficulty reaching the provider, please page the [facility] (Director on Call) for the Hospitalists listed on amion for assistance.  12/19/2018, 11:01 AM

## 2018-12-19 NOTE — Consult Note (Signed)
[facility]    Date of Admission:  12/17/2018     Total days of antibiotics                Reason for Consult: Group B Strep Bacteremia  Referring Provider: Ghimire Primary Care Provider: Patient, No Pcp Per   Assessment/Plan:  Mr. Hoey is a 33 y/o male with history of seizures and ETOH abuse presenting to ED for fever, nausea, vomiting and seizure. Blood cultures were positive for Group B Streptococcus bacteremia. Source of the infection remains unclear at present although does have a rash located on his distal right lower extremity which could represent cellulitis. Other symptoms likely related to alcohol withdrawal as he has been working on cutting back. GI panel is pending with respiratory panel being negative. Repeat blood cultures have been ordered. Will narrow him to Penicillin for Group B strep infection and order TTE.   1. Discontinue vancomycin, ceftriaxone and metronidazole. 2. Start penicillin q 12 infusions per pharmacy dosing.  3. Monitor fevers and culture results. 4. Alcohol withdrawal symptoms per primary team.  5. TTE to rule out endocarditis.    Active Problems:   Alcohol withdrawal seizure, with unspecified complication (HCC)   Bacteremia due to group B Streptococcus   . enoxaparin (LOVENOX) injection  40 mg Subcutaneous Q24H  . feeding supplement (ENSURE ENLIVE)  237 mL Oral BID BM  . folic acid  1 mg Oral Daily  . gabapentin  300 mg Oral BID  . metoprolol tartrate  5 mg Intravenous Q6H  . multivitamin with minerals  1 tablet Oral Daily  . pyridOXINE  100 mg Intravenous Daily  . QUEtiapine  50 mg Oral QHS  . thiamine  100 mg Oral Daily   Or  . thiamine  100 mg Intravenous Daily     HPI: Jacee Tullos is a 33 y.o. male with previous medical history of seizures and ETOH abuse admitted to the hospital with the chief complaints of nausea, vomiting and seizure.  Mr. Brenneman has been working on decreasing his alcohol intake  recently. His parents report he started feel poorly after a meal he ate on Friday evening and wondering if he had a stomach virus versus symptoms of early alcohol withdrawal.   Upon arrival to the ED noted to have temperature of 100 and tachycardiac at 147. CT imaging of the head with no acute intracranial abnormality. Chest x-ray with no active cardiopulmonary disease. Blood work showed WBC count of 5, AST 83, and  ALT 51.   Mr. Ellwanger has remained febrile with temperatures ranging from 103.2 to 99.1. Blood work continues to show transaminitis with WBC count of 6.9. Blood cultures are positive for Group B Streptococcus in 2/4 bottles. He is also having diarrhea that has been going on for a day now with GI pathogen panel pending. Respiratory panel completed and negative. Current antibiotic therapy includes vancomycin, ceftriaxone, and metronidazole. ID has been asked for antibiotic recommendations.  Family notes he does have an acute rash located on his right lower leg that they do not believe was present yesterday. Mr. Krimmer indicates that he has pain in his shoulders and upper back at present.   Review of Systems: Review of Systems  Constitutional: Positive for chills, fever and malaise/fatigue. Negative for weight loss.  Respiratory: Negative for cough, shortness of breath and wheezing.   Cardiovascular: Negative for chest pain and leg swelling.  Gastrointestinal: Positive for diarrhea. Negative for abdominal pain, constipation,  nausea and vomiting.  Skin: Positive for rash.  Neurological: Negative for dizziness and seizures.     Past Medical History:  Diagnosis Date  . Seizures (HCC) 08/2018    Social History   Tobacco Use  . Smoking status: Current Every Day Smoker    Types: Cigarettes  . Smokeless tobacco: Never Used  . Tobacco comment: 09/28/18 1/3 PPD  Substance Use Topics  . Alcohol use: Yes    Alcohol/week: 6.0 standard drinks    Types: 3 Cans of beer, 3 Standard drinks  or equivalent per week    Comment: 09/28/18 1-2 daily  . Drug use: No    Family History  Problem Relation Age of Onset  . Fibromyalgia Mother     No Known Allergies  OBJECTIVE: Blood pressure 119/78, pulse (!) 113, temperature 99.1 F (37.3 C), temperature source Oral, resp. rate 19, height  (1.778 m), weight 92.8 kg, SpO2 98 %.  Physical Exam Constitutional:      General: He is not in acute distress.    Appearance: He is well-developed. He is ill-appearing.  Cardiovascular:     Rate and Rhythm: Normal rate and regular rhythm.     Heart sounds: Normal heart sounds.  Pulmonary:     Effort: Pulmonary effort is normal. No respiratory distress.     Breath sounds: Normal breath sounds. No wheezing or rhonchi.  Genitourinary:    Comments: Rectal pouch present with no drainage in bag.  Skin:    General: Skin is warm and dry.     Comments: Right lower leg with warmth and redness compared to the contralateral side.   Neurological:     Mental Status: He is easily aroused. He is lethargic.     Lab Results Lab Results  Component Value Date   WBC 6.9 12/19/2018   HGB 12.7 (L) 12/19/2018   HCT 37.7 (L) 12/19/2018   MCV 100.8 (H) 12/19/2018   PLT 53 (L) 12/19/2018    Lab Results  Component Value Date   CREATININE 0.99 12/19/2018   BUN 10 12/19/2018   NA 136 12/19/2018   K 3.2 (L) 12/19/2018   CL 103 12/19/2018   CO2 22 12/19/2018    Lab Results  Component Value Date   ALT 750 (H) 12/19/2018   AST 2,241 (H) 12/19/2018   ALKPHOS 23 (L) 12/19/2018   BILITOT 1.6 (H) 12/19/2018     Microbiology: Recent Results (from the past 240 hour(s))  Respiratory Panel by PCR     Status: None   Collection Time: 12/18/18 12:18 PM  Result Value Ref Range Status   Adenovirus NOT DETECTED NOT DETECTED Final   Coronavirus 229E NOT DETECTED NOT DETECTED Final    Comment: (NOTE) The Coronavirus on the Respiratory Panel, DOES NOT test for the novel  Coronavirus (2019 nCoV)     Coronavirus HKU1 NOT DETECTED NOT DETECTED Final   Coronavirus NL63 NOT DETECTED NOT DETECTED Final   Coronavirus OC43 NOT DETECTED NOT DETECTED Final   Metapneumovirus NOT DETECTED NOT DETECTED Final   Rhinovirus / Enterovirus NOT DETECTED NOT DETECTED Final   Influenza A NOT DETECTED NOT DETECTED Final   Influenza B NOT DETECTED NOT DETECTED Final   Parainfluenza Virus 1 NOT DETECTED NOT DETECTED Final   Parainfluenza Virus 2 NOT DETECTED NOT DETECTED Final   Parainfluenza Virus 3 NOT DETECTED NOT DETECTED Final   Parainfluenza Virus 4 NOT DETECTED NOT DETECTED Final   Respiratory Syncytial Virus NOT DETECTED NOT DETECTED Final  Bordetella pertussis NOT DETECTED NOT DETECTED Final   Chlamydophila pneumoniae NOT DETECTED NOT DETECTED Final   Mycoplasma pneumoniae NOT DETECTED NOT DETECTED Final    Comment: Performed at [facility], [address], [place], [place] [address]  Culture, blood (routine x 2)     Status: None (Preliminary result)   Collection Time: 12/18/18  1:00 PM  Result Value Ref Range Status   Specimen Description BLOOD LEFT HAND  Final   Special Requests   Final    BOTTLES DRAWN AEROBIC ONLY Blood Culture adequate volume   Culture  Setup Time   Final    GRAM POSITIVE COCCI IN CHAINS AEROBIC BOTTLE ONLY CRITICAL RESULT CALLED TO, READ BACK BY AND VERIFIED WITH: J.LADFORD,PHARMD AT 0450 ON 12/19/18 BY G.MCADOO Performed at [facility], [address], [place], [place] [address]    Culture GRAM POSITIVE COCCI IN CHAINS  Final   Report Status PENDING  Incomplete  Blood Culture ID Panel (Reflexed)     Status: Abnormal   Collection Time: 12/18/18  1:00 PM  Result Value Ref Range Status   Enterococcus species NOT DETECTED NOT DETECTED Final   Listeria monocytogenes NOT DETECTED NOT DETECTED Final   Staphylococcus species NOT DETECTED NOT DETECTED Final   Staphylococcus aureus (BCID) NOT DETECTED NOT DETECTED Final   Streptococcus species DETECTED (A) NOT  DETECTED Final    Comment: CRITICAL RESULT CALLED TO, READ BACK BY AND VERIFIED WITH: J.LADFORD,PHARMD AT 0450 ON 12/19/18 BY G.MCADOO    Streptococcus agalactiae NOT DETECTED NOT DETECTED Final   Streptococcus pneumoniae NOT DETECTED NOT DETECTED Final   Streptococcus pyogenes DETECTED (A) NOT DETECTED Final    Comment: CRITICAL RESULT CALLED TO, READ BACK BY AND VERIFIED WITH: J.LADFORD,PHARMD AT 0450 ON 12/19/18 BY G.MCADOO    Acinetobacter baumannii NOT DETECTED NOT DETECTED Final   Enterobacteriaceae species NOT DETECTED NOT DETECTED Final   Enterobacter cloacae complex NOT DETECTED NOT DETECTED Final   Escherichia coli NOT DETECTED NOT DETECTED Final   Klebsiella oxytoca NOT DETECTED NOT DETECTED Final   Klebsiella pneumoniae NOT DETECTED NOT DETECTED Final   Proteus species NOT DETECTED NOT DETECTED Final   Serratia marcescens NOT DETECTED NOT DETECTED Final   Haemophilus influenzae NOT DETECTED NOT DETECTED Final   Neisseria meningitidis NOT DETECTED NOT DETECTED Final   Pseudomonas aeruginosa NOT DETECTED NOT DETECTED Final   Candida albicans NOT DETECTED NOT DETECTED Final   Candida glabrata NOT DETECTED NOT DETECTED Final   Candida krusei NOT DETECTED NOT DETECTED Final   Candida parapsilosis NOT DETECTED NOT DETECTED Final   Candida tropicalis NOT DETECTED NOT DETECTED Final    Comment: Performed at [facility], [address] [place], [place] [address]  Culture, blood (routine x 2)     Status: None (Preliminary result)   Collection Time: 12/18/18  1:20 PM  Result Value Ref Range Status   Specimen Description BLOOD RIGHT HAND  Final   Special Requests   Final    BOTTLES DRAWN AEROBIC ONLY Blood Culture results may not be optimal due to an inadequate volume of blood received in culture bottles   Culture  Setup Time   Final    GRAM POSITIVE COCCI IN CHAINS AEROBIC BOTTLE ONLY CRITICAL VALUE NOTED.  VALUE IS CONSISTENT WITH PREVIOUSLY REPORTED AND CALLED  VALUE. Performed at [facility], [address], [place], [place] [address]    Culture GRAM POSITIVE COCCI IN CHAINS  Final  Report Status PENDING  Incomplete     Marcos Eke, NP [facility] [phone] Pager  12/19/2018  11:00 AM

## 2018-12-20 ENCOUNTER — Inpatient Hospital Stay (HOSPITAL_COMMUNITY): Payer: BLUE CROSS/BLUE SHIELD

## 2018-12-20 ENCOUNTER — Encounter (HOSPITAL_COMMUNITY): Payer: Self-pay | Admitting: Physician Assistant

## 2018-12-20 DIAGNOSIS — B950 Streptococcus, group A, as the cause of diseases classified elsewhere: Secondary | ICD-10-CM

## 2018-12-20 DIAGNOSIS — R569 Unspecified convulsions: Secondary | ICD-10-CM

## 2018-12-20 DIAGNOSIS — R945 Abnormal results of liver function studies: Secondary | ICD-10-CM

## 2018-12-20 DIAGNOSIS — G9341 Metabolic encephalopathy: Secondary | ICD-10-CM

## 2018-12-20 DIAGNOSIS — I428 Other cardiomyopathies: Secondary | ICD-10-CM

## 2018-12-20 DIAGNOSIS — M25511 Pain in right shoulder: Secondary | ICD-10-CM

## 2018-12-20 LAB — GASTROINTESTINAL PANEL BY PCR, STOOL (REPLACES STOOL CULTURE)
Adenovirus F40/41: NOT DETECTED
Astrovirus: NOT DETECTED
Campylobacter species: NOT DETECTED
Cryptosporidium: NOT DETECTED
Cyclospora cayetanensis: NOT DETECTED
ENTAMOEBA HISTOLYTICA: NOT DETECTED
Enteroaggregative E coli (EAEC): NOT DETECTED
Enteropathogenic E coli (EPEC): NOT DETECTED
Enterotoxigenic E coli (ETEC): NOT DETECTED
Giardia lamblia: NOT DETECTED
Norovirus GI/GII: NOT DETECTED
Plesimonas shigelloides: NOT DETECTED
Rotavirus A: NOT DETECTED
SHIGA LIKE TOXIN PRODUCING E COLI (STEC): NOT DETECTED
Salmonella species: NOT DETECTED
Sapovirus (I, II, IV, and V): NOT DETECTED
Shigella/Enteroinvasive E coli (EIEC): NOT DETECTED
Vibrio cholerae: NOT DETECTED
Vibrio species: NOT DETECTED
Yersinia enterocolitica: NOT DETECTED

## 2018-12-20 LAB — CBC
HCT: 32.4 % — ABNORMAL LOW (ref 39.0–52.0)
HEMOGLOBIN: 11.6 g/dL — AB (ref 13.0–17.0)
MCH: 35.7 pg — ABNORMAL HIGH (ref 26.0–34.0)
MCHC: 35.8 g/dL (ref 30.0–36.0)
MCV: 99.7 fL (ref 80.0–100.0)
Platelets: 60 10*3/uL — ABNORMAL LOW (ref 150–400)
RBC: 3.25 MIL/uL — ABNORMAL LOW (ref 4.22–5.81)
RDW: 13.9 % (ref 11.5–15.5)
WBC: 5.7 10*3/uL (ref 4.0–10.5)
nRBC: 0 % (ref 0.0–0.2)

## 2018-12-20 LAB — HEPATIC FUNCTION PANEL
ALT: 512 U/L — AB (ref 0–44)
AST: 549 U/L — ABNORMAL HIGH (ref 15–41)
Albumin: 2.7 g/dL — ABNORMAL LOW (ref 3.5–5.0)
Alkaline Phosphatase: 30 U/L — ABNORMAL LOW (ref 38–126)
Bilirubin, Direct: 0.6 mg/dL — ABNORMAL HIGH (ref 0.0–0.2)
Indirect Bilirubin: 1 mg/dL — ABNORMAL HIGH (ref 0.3–0.9)
Total Bilirubin: 1.6 mg/dL — ABNORMAL HIGH (ref 0.3–1.2)
Total Protein: 5.6 g/dL — ABNORMAL LOW (ref 6.5–8.1)

## 2018-12-20 LAB — BASIC METABOLIC PANEL
ANION GAP: 6 (ref 5–15)
BUN: 19 mg/dL (ref 6–20)
CO2: 21 mmol/L — ABNORMAL LOW (ref 22–32)
Calcium: 8 mg/dL — ABNORMAL LOW (ref 8.9–10.3)
Chloride: 110 mmol/L (ref 98–111)
Creatinine, Ser: 1.53 mg/dL — ABNORMAL HIGH (ref 0.61–1.24)
GFR calc Af Amer: >60 mL/min (ref >60)
GFR calc non Af Amer: 58 mL/min — ABNORMAL LOW (ref >60)
Glucose, Bld: 107 mg/dL — ABNORMAL HIGH (ref 70–99)
Potassium: 3.4 mmol/L — ABNORMAL LOW (ref 3.5–5.1)
Sodium: 137 mmol/L (ref 135–145)

## 2018-12-20 LAB — HEPATITIS PANEL, ACUTE
HCV Ab: <0.1 s/co ratio (ref 0.0–0.9)
Hep A IgM: NEGATIVE
Hep B C IgM: NEGATIVE
Hepatitis B Surface Ag: NEGATIVE

## 2018-12-20 LAB — AMMONIA: Ammonia: 34 umol/L (ref 9–35)

## 2018-12-20 LAB — PROTIME-INR
INR: 1.2 (ref 0.8–1.2)
Prothrombin Time: 14.6 seconds (ref 11.4–15.2)

## 2018-12-20 LAB — LEVETIRACETAM LEVEL: Levetiracetam Lvl: 10.5 ug/mL (ref 10.0–40.0)

## 2018-12-20 MED ORDER — LORAZEPAM 2 MG/ML IJ SOLN
2.0000 mg | Freq: Once | INTRAMUSCULAR | Status: AC
  Administered 2018-12-20: 2 mg via INTRAVENOUS

## 2018-12-20 MED ORDER — POTASSIUM CHLORIDE CRYS ER 20 MEQ PO TBCR
40.0000 meq | EXTENDED_RELEASE_TABLET | Freq: Once | ORAL | Status: AC
  Administered 2018-12-20: 40 meq via ORAL
  Filled 2018-12-20: qty 2

## 2018-12-20 MED ORDER — CARVEDILOL 3.125 MG PO TABS
3.1250 mg | ORAL_TABLET | Freq: Two times a day (BID) | ORAL | Status: DC
  Administered 2018-12-20: 3.125 mg via ORAL
  Filled 2018-12-20 (×2): qty 1

## 2018-12-20 MED ORDER — LORAZEPAM 2 MG/ML IJ SOLN
2.0000 mg | INTRAMUSCULAR | Status: DC | PRN
  Administered 2018-12-21: 2 mg via INTRAVENOUS
  Filled 2018-12-20: qty 1

## 2018-12-20 MED ORDER — LOPERAMIDE HCL 2 MG PO CAPS
2.0000 mg | ORAL_CAPSULE | ORAL | Status: DC | PRN
  Administered 2018-12-20: 2 mg via ORAL
  Filled 2018-12-20 (×2): qty 1

## 2018-12-20 MED ORDER — LEVETIRACETAM 500 MG PO TABS
1000.0000 mg | ORAL_TABLET | Freq: Two times a day (BID) | ORAL | Status: DC
  Administered 2018-12-20 – 2018-12-21 (×2): 1000 mg via ORAL
  Filled 2018-12-20 (×2): qty 2

## 2018-12-20 NOTE — Progress Notes (Signed)
[facility]  Date of Admission:  12/17/2018     Total days of antibiotics 3         ASSESSMENT/PLAN  Mr. Belka has Group A Streptococcus bacteremia and likely cellulitis of the right lower extremity. Mental status is slightly improved today although remained with intermittent fever overnight. Cellulitis appears to be improving.  Repeat cultures are without growth to date. Echocardiogram without evidence of vegetation. Source of infection remains unclear at present although likely contributory to seizure along with alcohol withdrawal. He does have right shoulder pain and question if that is related to his fall or possible area of infection. Will check x-ray today. If fevers persist, may need to consider MRI. Plan to continue current Penicillin.  1. Continue Penicillin. 2. Alcohol withdrawal per primary team. 3. Continue to monitor cultures and for fever.   Active Problems:   Alcohol withdrawal seizure, with unspecified complication (HCC)   Bacterial infection due to streptococcus, group A   . enoxaparin (LOVENOX) injection  40 mg Subcutaneous Q24H  . feeding supplement (ENSURE ENLIVE)  237 mL Oral BID BM  . folic acid  1 mg Oral Daily  . gabapentin  300 mg Oral BID  . levETIRAcetam  1,000 mg Oral Q12H  . multivitamin with minerals  1 tablet Oral Daily  . nicotine  21 mg Transdermal Daily  . pyridOXINE  100 mg Intravenous Daily  . QUEtiapine  50 mg Oral QHS  . thiamine  100 mg Oral Daily    SUBJECTIVE:  Febrile overnight with a max temperature of 101.1. WBC count without leukocytosis. Repeat blood cultures without growth in <24 hours. GI pathogen panel negative. Liver function tests improving. Viral hepatitis panel negative. EEG performed with no elipeptiform activity.   Having right shoulder pain.   No Known Allergies   Review of Systems: Review of Systems  Constitutional: Negative for chills, fever and malaise/fatigue.  Respiratory: Negative  for cough and shortness of breath.   Cardiovascular: Negative for chest pain and leg swelling.  Gastrointestinal: Positive for diarrhea. Negative for abdominal pain, constipation, nausea and vomiting.  Skin: Positive for rash.      OBJECTIVE: Vitals:   12/20/18 0216 12/20/18 0246 12/20/18 0331 12/20/18 0417  BP: 139/86 137/86 139/90 (!) 146/100  Pulse: (!) 115 (!) 109 (!) 104 (!) 103  Resp: 18 (!) 24 (!) 21 13  Temp:    98.2 F (36.8 C)  TempSrc:    Axillary  SpO2: 94% 95% 95% 93%  Weight:      Height:       Body mass index is 29.36 kg/m.  Physical Exam Constitutional:      General: He is not in acute distress.    Appearance: He is well-developed.  Cardiovascular:     Rate and Rhythm: Normal rate and regular rhythm.     Heart sounds: Normal heart sounds.  Pulmonary:     Effort: Pulmonary effort is normal.     Breath sounds: Normal breath sounds.  Skin:    General: Skin is warm and dry.     Comments: Rash appears improved with less redness.   Neurological:     Mental Status: He is alert.  Psychiatric:        Speech: Speech normal.        Behavior: Behavior is slowed.     Lab Results Lab Results  Component Value Date   WBC 5.7 12/20/2018   HGB 11.6 (L) 12/20/2018   HCT  32.4 (L) 12/20/2018   MCV 99.7 12/20/2018   PLT 60 (L) 12/20/2018    Lab Results  Component Value Date   CREATININE 1.53 (H) 12/20/2018   BUN 19 12/20/2018   NA 137 12/20/2018   K 3.4 (L) 12/20/2018   CL 110 12/20/2018   CO2 21 (L) 12/20/2018    Lab Results  Component Value Date   ALT 512 (H) 12/20/2018   AST 549 (H) 12/20/2018   ALKPHOS 30 (L) 12/20/2018   BILITOT 1.6 (H) 12/20/2018     Microbiology: Recent Results (from the past 240 hour(s))  Respiratory Panel by PCR     Status: None   Collection Time: 12/18/18 12:18 PM  Result Value Ref Range Status   Adenovirus NOT DETECTED NOT DETECTED Final   Coronavirus 229E NOT DETECTED NOT DETECTED Final    Comment: (NOTE) The  Coronavirus on the Respiratory Panel, DOES NOT test for the novel  Coronavirus (2019 nCoV)    Coronavirus HKU1 NOT DETECTED NOT DETECTED Final   Coronavirus NL63 NOT DETECTED NOT DETECTED Final   Coronavirus OC43 NOT DETECTED NOT DETECTED Final   Metapneumovirus NOT DETECTED NOT DETECTED Final   Rhinovirus / Enterovirus NOT DETECTED NOT DETECTED Final   Influenza A NOT DETECTED NOT DETECTED Final   Influenza B NOT DETECTED NOT DETECTED Final   Parainfluenza Virus 1 NOT DETECTED NOT DETECTED Final   Parainfluenza Virus 2 NOT DETECTED NOT DETECTED Final   Parainfluenza Virus 3 NOT DETECTED NOT DETECTED Final   Parainfluenza Virus 4 NOT DETECTED NOT DETECTED Final   Respiratory Syncytial Virus NOT DETECTED NOT DETECTED Final   Bordetella pertussis NOT DETECTED NOT DETECTED Final   Chlamydophila pneumoniae NOT DETECTED NOT DETECTED Final   Mycoplasma pneumoniae NOT DETECTED NOT DETECTED Final    Comment: Performed at [facility], [address] [place], [place] [address]  Culture, blood (routine x 2)     Status: Abnormal (Preliminary result)   Collection Time: 12/18/18  1:00 PM  Result Value Ref Range Status   Specimen Description BLOOD LEFT HAND  Final   Special Requests   Final    BOTTLES DRAWN AEROBIC ONLY Blood Culture adequate volume   Culture  Setup Time   Final    GRAM POSITIVE COCCI IN CHAINS AEROBIC BOTTLE ONLY CRITICAL RESULT CALLED TO, READ BACK BY AND VERIFIED WITH: J.LADFORD,PHARMD AT 0450 ON 12/19/18 BY G.MCADOO    Culture (A)  Final    GROUP A STREP (S.PYOGENES) ISOLATED SUSCEPTIBILITIES TO FOLLOW HEALTH DEPARTMENT NOTIFIED Performed at [facility], [address] [place], [place] [address]    Report Status PENDING  Incomplete  Blood Culture ID Panel (Reflexed)     Status: Abnormal   Collection Time: 12/18/18  1:00 PM  Result Value Ref Range Status   Enterococcus species NOT DETECTED NOT DETECTED Final   Listeria monocytogenes NOT DETECTED NOT  DETECTED Final   Staphylococcus species NOT DETECTED NOT DETECTED Final   Staphylococcus aureus (BCID) NOT DETECTED NOT DETECTED Final   Streptococcus species DETECTED (A) NOT DETECTED Final    Comment: CRITICAL RESULT CALLED TO, READ BACK BY AND VERIFIED WITH: J.LADFORD,PHARMD AT 0450 ON 12/19/18 BY G.MCADOO    Streptococcus agalactiae NOT DETECTED NOT DETECTED Final   Streptococcus pneumoniae NOT DETECTED NOT DETECTED Final   Streptococcus pyogenes DETECTED (A) NOT DETECTED Final    Comment: CRITICAL RESULT CALLED TO, READ BACK BY AND VERIFIED WITH: J.LADFORD,PHARMD AT 0450 ON 12/19/18 BY G.MCADOO  Acinetobacter baumannii NOT DETECTED NOT DETECTED Final   Enterobacteriaceae species NOT DETECTED NOT DETECTED Final   Enterobacter cloacae complex NOT DETECTED NOT DETECTED Final   Escherichia coli NOT DETECTED NOT DETECTED Final   Klebsiella oxytoca NOT DETECTED NOT DETECTED Final   Klebsiella pneumoniae NOT DETECTED NOT DETECTED Final   Proteus species NOT DETECTED NOT DETECTED Final   Serratia marcescens NOT DETECTED NOT DETECTED Final   Haemophilus influenzae NOT DETECTED NOT DETECTED Final   Neisseria meningitidis NOT DETECTED NOT DETECTED Final   Pseudomonas aeruginosa NOT DETECTED NOT DETECTED Final   Candida albicans NOT DETECTED NOT DETECTED Final   Candida glabrata NOT DETECTED NOT DETECTED Final   Candida krusei NOT DETECTED NOT DETECTED Final   Candida parapsilosis NOT DETECTED NOT DETECTED Final   Candida tropicalis NOT DETECTED NOT DETECTED Final    Comment: Performed at [facility], [address], [place], [place] [address]  Culture, blood (routine x 2)     Status: Abnormal (Preliminary result)   Collection Time: 12/18/18  1:20 PM  Result Value Ref Range Status   Specimen Description BLOOD RIGHT HAND  Final   Special Requests   Final    BOTTLES DRAWN AEROBIC ONLY Blood Culture results may not be optimal due to an inadequate volume of blood received in culture  bottles   Culture  Setup Time   Final    GRAM POSITIVE COCCI IN CHAINS AEROBIC BOTTLE ONLY CRITICAL VALUE NOTED.  VALUE IS CONSISTENT WITH PREVIOUSLY REPORTED AND CALLED VALUE.    Culture (A)  Final    GROUP A STREP (S.PYOGENES) ISOLATED HEALTH DEPARTMENT NOTIFIED Performed at [facility], [address] [place], [place] [address]    Report Status PENDING  Incomplete  Gastrointestinal Panel by PCR , Stool     Status: None   Collection Time: 12/18/18  3:19 PM  Result Value Ref Range Status   Campylobacter species NOT DETECTED NOT DETECTED Final   Plesimonas shigelloides NOT DETECTED NOT DETECTED Final   Salmonella species NOT DETECTED NOT DETECTED Final   Yersinia enterocolitica NOT DETECTED NOT DETECTED Final   Vibrio species NOT DETECTED NOT DETECTED Final   Vibrio cholerae NOT DETECTED NOT DETECTED Final   Enteroaggregative E coli (EAEC) NOT DETECTED NOT DETECTED Final   Enteropathogenic E coli (EPEC) NOT DETECTED NOT DETECTED Final   Enterotoxigenic E coli (ETEC) NOT DETECTED NOT DETECTED Final   Shiga like toxin producing E coli (STEC) NOT DETECTED NOT DETECTED Final   Shigella/Enteroinvasive E coli (EIEC) NOT DETECTED NOT DETECTED Final   Cryptosporidium NOT DETECTED NOT DETECTED Final   Cyclospora cayetanensis NOT DETECTED NOT DETECTED Final   Entamoeba histolytica NOT DETECTED NOT DETECTED Final   Giardia lamblia NOT DETECTED NOT DETECTED Final   Adenovirus F40/41 NOT DETECTED NOT DETECTED Final   Astrovirus NOT DETECTED NOT DETECTED Final   Norovirus GI/GII NOT DETECTED NOT DETECTED Final   Rotavirus A NOT DETECTED NOT DETECTED Final   Sapovirus (I, II, IV, and V) NOT DETECTED NOT DETECTED Final    Comment: Performed at [facility], [address] [place], [place] [address]  Culture, blood (routine x 2)     Status: None (Preliminary result)   Collection Time: 12/19/18  9:13 AM  Result Value Ref Range Status   Specimen Description BLOOD RIGHT  ANTECUBITAL  Final   Special Requests AEROBIC BOTTLE ONLY Blood Culture adequate volume  Final   Culture   Final  NO GROWTH < 24 HOURS Performed at [facility], [address] [place], [place] [address]    Report Status PENDING  Incomplete  Culture, blood (routine x 2)     Status: None (Preliminary result)   Collection Time: 12/19/18  9:26 AM  Result Value Ref Range Status   Specimen Description BLOOD RIGHT THUMB  Final   Special Requests AEROBIC BOTTLE ONLY Blood Culture adequate volume  Final   Culture   Final    NO GROWTH < 24 HOURS Performed at [facility], [address] [place], [place] [address]    Report Status PENDING  Incomplete     Marcos Eke, NP [facility] [phone] Pager  12/20/2018  10:59 AM

## 2018-12-20 NOTE — Consult Note (Addendum)
Cardiology Consultation:   Patient ID: Darren Diaz MRN: [id number]; DOB: [birth year]  Admit date: 12/17/2018 Date of Consult: 12/20/2018  Primary Care Provider: Patient, No Pcp Per Primary Cardiologist: New to [facility]   Patient Profile:   Darren Diaz is a 33 y.o. male with a hx of PDA repair (age 1 at [facility]), seizure disorder due heavy alcohol drinking and tobacco smoking who is being seen today for the evaluation of Low EF at the request of Dr. Jerral Ralph.  Hx of PDA repair at age 23. Per parent, it was minor. No cardiac issue since then.  Maternal father had CABG at age 36.  History of Present Illness:   Darren Diaz presented 3/7 for breakthrough seizure. Per note, he has been cutting down on alcohol use. Admitted for AMS and sepsis 2nd to streptococcus bacteremia, RLE cellulitis and alcohol withdrawal. Noted transaminitis>>Improving. RUQ [place] without acute finding. On abx per ID>> no indication for TEE per note. Echo showed Low EF as below and cardiology is asked for further evaluation.   1. The left ventricle has a visually estimated ejection fraction of of 35%. The cavity size was mildly dilated. Left ventricular diastolic Doppler parameters are consistent with impaired relaxation. Left ventricular diffuse hypokinesis.  2. The right ventricle has normal systolic function. The cavity was normal. There is no increase in right ventricular wall thickness.  3. No evidence of mitral valve stenosis. Trivial mitral regurgitation.  4. The aortic valve is tricuspid no stenosis of the aortic valve.  5. The aortic root and ascending aorta are normal in size and structure.  6. The inferior vena cava was dilated in size with <50% respiratory variability. No complete TR doppler jet so unable to estimate PA systolic pressure.  Patient remained delirium. Hx obtained for chart and family. Intermittent febrile since admit. Patient lives by himself. Parents denied hx of cocaine abuse. Remained tachycardic  and hypertensive minimally.  Past Medical History:  Diagnosis Date  . Alcohol abuse   . 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 Seizures (HCC) 08/2018  . Tobacco abuse     Past Surgical History:  Procedure Laterality Date  . 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 enoxaparin (LOVENOX) injection  40 mg Subcutaneous Q24H  . feeding supplement (ENSURE ENLIVE)  237 mL Oral BID BM  . folic acid  1 mg Oral Daily  . gabapentin  300 mg Oral BID  . levETIRAcetam  1,000 mg Oral Q12H  . multivitamin with minerals  1 tablet Oral Daily  . nicotine  21 mg Transdermal Daily  . pyridOXINE  100 mg Intravenous Daily  . QUEtiapine  50 mg Oral QHS  . thiamine  100 mg Oral Daily   Continuous Infusions: . 0.9 % NaCl with KCl 40 mEq / L 75 mL/hr (12/20/18 0151)  . penicillin g continuous IV infusion 12 Million Units (12/20/18 0502)   PRN Meds: acetaminophen **OR** acetaminophen, ibuprofen, loperamide, LORazepam, LORazepam, metoprolol tartrate, ondansetron **OR** ondansetron (ZOFRAN) IV  Allergies:   No Known Allergies  Social History:   Social History   Socioeconomic History  . Marital status: Single    Spouse name: Not on file  . Number of children: 0  . Years of education: Not on file  . Highest education level: Master's degree (e.g., MA, MS, MEng, MEd, MSW, MBA)  Occupational History  . Not on file  Social Needs  . Financial resource strain: Not  on file  . Food insecurity:    Worry: Not on file    Inability: Not on file  . Transportation needs:    Medical: Not on file    Non-medical: Not on file  Tobacco Use  . Smoking status: Current Every Day Smoker    Types: Cigarettes  . Smokeless tobacco: Never Used  . Tobacco comment: 09/28/18 1/3 PPD  Substance and Sexual Activity  . Alcohol use: Yes    Alcohol/week: 6.0 standard drinks    Types: 3 Cans of beer, 3 Standard drinks or equivalent per week    Comment: 09/28/18 1-2 daily  . Drug  use: No  . Sexual activity: Yes    Birth control/protection: None  Lifestyle  . Physical activity:    Days per week: Not on file    Minutes per session: Not on file  . Stress: Not on file  Relationships  . Social connections:    Talks on phone: Not on file    Gets together: Not on file    Attends religious service: Not on file    Active member of club or organization: Not on file    Attends meetings of clubs or organizations: Not on file    Relationship status: Not on file  . Intimate partner violence:    Fear of current or ex partner: Not on file    Emotionally abused: Not on file    Physically abused: Not on file    Forced sexual activity: Not on file  Other Topics Concern  . Not on file  Social History Narrative   Lives alone   Caffeine- 0-1 cups coffee daily    Family History:   Family History  Problem Relation Age of Onset  . Fibromyalgia Mother      ROS:  Please see the history of present illness.  Unable to review full system as patient has delirium  Physical Exam/Data:   Vitals:   12/20/18 0800 12/20/18 0900 12/20/18 1000 12/20/18 1100  BP:      Pulse: 98     Resp: (!) 24 (!) 26 (!) 23 (!) 28  Temp:      TempSrc:      SpO2: 94%     Weight:      Height:        Intake/Output Summary (Last 24 hours) at 12/20/2018 1430 Last data filed at 12/20/2018 1100 Gross per 24 hour  Intake 1596.06 ml  Output 1100 ml  Net 496.06 ml   Last 3 Weights 12/17/2018 09/28/2018 09/21/2018  Weight (lbs) 204 lb 9.4 oz 203 lb 6.4 oz 194 lb 0.1 oz  Weight (kg) 92.8 kg 92.262 kg 88 kg     Body mass index is 29.36 kg/m.  General:ill appearing delirious male in AMS HEENT: normal Lymph: no adenopathy Neck: no JVD Endocrine:  No thryomegaly Vascular: No carotid bruits; FA pulses 2+ bilaterally without bruits  Cardiac:  normal S1, S2; RRR; no murmur  Lungs:  clear to auscultation bilaterally, no wheezing, rhonchi or rales  Abd: soft, nontender, no hepatomegaly  Ext: R lower  extremity edema Musculoskeletal:  No deformities, BUE and BLE strength normal and equal Skin: warm and dry  Neuro: Delirium, no focal abnormalities noted Psych:  Delirium   EKG:  The EKG was personally reviewed and demonstrates:  Sinus tachycardia at rate of 100 bpm Telemetry:  Telemetry was personally reviewed and demonstrates:  Sinus tachycardia at 100-120s  Relevant CV Studies: As above  Laboratory Data:  Chemistry  Recent Labs  Lab 12/18/18 1321 12/19/18 0356 12/20/18 0332  NA 137 136 137  K 2.9* 3.2* 3.4*  CL 101 103 110  CO2 22 22 21*  GLUCOSE 106* 96 107*  BUN CREATININE 0.95 0.99 1.53*  CALCIUM 9.2 8.0* 8.0*  GFRNONAA >60 >60 58*  GFRAA >60 >60 >60  ANIONGAP Recent Labs  Lab 12/18/18 1321 12/19/18 0356 12/20/18 0332  PROT 7.5 6.5 5.6*  ALBUMIN 3.8 3.2* 2.7*  AST 138* 2,241* 549*  ALT 77* 750* 512*  ALKPHOS 35* 23* 30*  BILITOT 1.7* 1.6* 1.6*   Hematology Recent Labs  Lab 12/18/18 1321 12/19/18 0356 12/20/18 0332  WBC 4.8 6.9 5.7  RBC 4.06* 3.74* 3.25*  HGB 13.9 12.7* 11.6*  HCT 39.9 37.7* 32.4*  MCV 98.3 100.8* 99.7  MCH 34.2* 34.0 35.7*  MCHC 34.8 33.7 35.8  RDW 14.0 14.1 13.9  PLT PLATELET CLUMPS NOTED ON SMEAR, COUNT APPEARS DECREASED 53* 60*   Radiology/Studies:  Dg Chest 2 View  Result Date: 12/17/2018 CLINICAL DATA:  Seizure, fever EXAM: CHEST - 2 VIEW COMPARISON:  09/09/2018 chest radiograph. FINDINGS: Stable cardiomediastinal silhouette with normal heart size. No pneumothorax. No pleural effusion. Lungs appear clear, with no acute consolidative airspace disease and no pulmonary edema. IMPRESSION: No active cardiopulmonary disease. Electronically Signed   By: Delbert Phenix M.D.   On: 12/17/2018 20:47   Ct Head Wo Contrast  Result Date: 12/17/2018 CLINICAL DATA:  Seizure, head trauma EXAM: CT HEAD WITHOUT CONTRAST TECHNIQUE: Contiguous axial images were obtained from the base of the skull through the vertex without  intravenous contrast. COMPARISON:  MRI 09/18/2018 FINDINGS: Brain: No acute intracranial abnormality. Specifically, no hemorrhage, hydrocephalus, mass lesion, acute infarction, or significant intracranial injury. Vascular: No hyperdense vessel or unexpected calcification. Skull: No acute calvarial abnormality. Sinuses/Orbits: Visualized paranasal sinuses and mastoids clear. Orbital soft tissues unremarkable. Other: None IMPRESSION: No acute intracranial abnormality. Electronically Signed   By: Charlett Nose M.D.   On: 12/17/2018 20:08   Dg Chest Port 1v Same Day  Result Date: 12/18/2018 CLINICAL DATA:  Shortness of breath EXAM: PORTABLE CHEST 1 VIEW COMPARISON:  December 17, 2018 FINDINGS: The heart size and mediastinal contours are within normal limits. Both lungs are clear. The visualized skeletal structures are unremarkable. IMPRESSION: No active disease. Electronically Signed   By: Gerome Sam III M.D   On: 12/18/2018 16:01   US Abdomen Limited Ruq  Result Date: 12/19/2018 CLINICAL DATA:  Elevated LFTs EXAM: ULTRASOUND ABDOMEN LIMITED RIGHT UPPER QUADRANT COMPARISON:  None. FINDINGS: Gallbladder: Evaluation of the gallbladder is limited. There appears to be a thickened wall measuring up to 5.5 mm. No stones, pericholecystic fluid, or Murphy's sign identified. Common bile duct: Diameter: 4.0 mm Liver: No focal lesion identified. Within normal limits in parenchymal echogenicity. Portal vein is patent on color Doppler imaging with normal direction of blood flow towards the liver. IMPRESSION: 1. The study is limited due to patient condition and difficulty following instructions. The gallbladder is not well assessed as it is contracted. The wall thickening to 5.5 mm could be due to lack of distention. No stones, pericholecystic fluid, or Murphy's sign identified. If there is concern for acute cholecystitis, recommend a HIDA scan. 2. No other abnormalities. Electronically Signed   By: Gerome Sam III M.D    On: 12/19/2018 13:54    Assessment and Plan:   1. Acute systolic heart failure - Echo showed LVEF of  35%, mild diastolic CHF, diffuse hypotensis. No significant valvular heart disease. Suspects alcohol induced. Appears euvolemic. Add BB. ADD entresto/ARB once renal function normal.   2. Bacteremia/RLE cellulitis - on Abx per ID>> no need for TEE per note.   3. AMS 2nd to alcohol withdrawal and bacteremia - Per primary   4. Transaminitis - Improving   5. AKi - Per primary   For questions or updates, please contact [facility] Please consult [email or web address] for contact info under     Lorelei Pont, PA  12/20/2018 2:30 PM   Agree with note by Chelsea Aus PA-C  Darren Diaz was admitted for alcohol withdrawal.  He has cellulitis, sepsis on IV antibiotics.  A 2D echo revealed an EF of 30% which is a new finding.  He is fairly active at home and exercises frequently without symptoms of heart failure.  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  His serum creatinine has gone up to 1.5.  We will hold off on adding spironolactone and ARB.  He would be a candidate for Entresto if his renal function improves.  We will start low-dose carvedilol and titrate as tolerated.  I would suggest inpatient rehab/detox since apparently when he was in previously for withdrawal he went back to drinking 3 days later at home.  Runell Gess, M.D., FACP, [facility], [facility] [address] [place], [place]  [address]  [phone] 12/20/2018 3:36 PM

## 2018-12-20 NOTE — Progress Notes (Signed)
Informed by RN-that patient more tachycardic, tachypneic and more confused Given 2 mg IV Ativan prior to my eval-now somewhat better-Not tachypneic-tachy to the 120's, still confused  Will change IV Ativan to 2 mg IV Q4 prn Continue Ativan per CIWA   If continues to develop symp inspite of escalating doses of Ativan will need ICU transfer for Precedex.

## 2018-12-20 NOTE — Procedures (Signed)
ELECTROENCEPHALOGRAM REPORT   Patient: Darren Diaz       Room #: 6V89F EEG No. ID: [id number] Age: 33 y.o.        Sex: male Referring Physician: Ghimire Report Date:  12/20/2018        Interpreting Physician: Thana Farr  History: Saahas Mcphie is an 33 y.o. male with history of ETOH abuse and seizure disorder presenting with breakthrough seizures  Medications:  Folvite, Neurontin, Keppra, MVI, B6, Seroquel, B1, Ativan  Conditions of Recording:  This is a 21 channel routine scalp EEG performed with bipolar and monopolar montages arranged in accordance to the international 10/20 system of electrode placement. One channel was dedicated to EKG recording.  The patient is in the awake state.  Description:  The background activity is very low voltage.  It is obscured for the most part by muscle and movement artifact along with an overlying low voltage beta activity that is diffusely distributed.  On rare occasions a poorly sustained, alpha, posterior background rhythm can be identified.    The patient does not drowse or sleep. No epileptiform activity is noted.   Hyperventilation and intermittent photic stimulation were not performed.   IMPRESSION: This electroencephalogram is dominated by artifact and a superimposed beta activity which likely represents a medication effect.  No epileptiform activity is noted.     Thana Farr, MD Neurology [phone] 12/20/2018, 10:09 AM

## 2018-12-20 NOTE — Progress Notes (Signed)
Pts agitation and confusion increasing. Pt tachycardic up to 160's, BP elevated and RR in 40's. Pt combative and making threats to this RN and NT. Pt swinging and kicking. Pt given several doses of Ativan to no relief.  Additional assistance called for safety and pt continued to threaten staff by saying, " I will swing on all of you". Security called to bedside and pt placed in bilateral wrist and ankle restraints.Pts safety maintained at all times and MD called to bedside for further assessment.

## 2018-12-20 NOTE — Progress Notes (Signed)
EEG complete - results pending 

## 2018-12-20 NOTE — Progress Notes (Signed)
PROGRESS NOTE        PATIENT DETAILS Name: Darren Diaz Age: 33 y.o. Sex: male Date of Birth: 09-10-1986 Admit Date: 12/17/2018 Admitting Physician Hillary Bow, DO PHX:[id number], No Pcp Per  Brief Narrative: Patient is a 33 y.o. male with history of alcohol use, seizure disorder-presenting with breakthrough seizure. Apparently patient has been cutting down his ETOH use.  See below for further details  Subjective: Overall better-more alert-still somewhat intermittently confused.  Febrile x1 yesterday but overall much improved.  With loose stools.  Assessment/Plan: Sepsis secondary to Streptococcus pyogenes bacteremia and right lower extremity cellulitis: Sepsis pathophysiology improving-repeat blood culture on 3/9 negative.  Fever curve slowly improving.  Evaluated by ID-on penicillin G.  Transthoracic echo negative for endocarditis.  Acute metabolic encephalopathy: Multifactorial-secondary to sepsis in the setting of bacteremia and alcohol withdrawal.  Mildly confused-but follows commands.  CT head negative for acute abnormalities, ammonia levels within normal limits, EEG without any seizures.  Seizure disorder: Continue IV Keppra-remains on IV Ativan per CIWA protocol.  EEG negative for seizures.  Transaminitis: Likely secondary to sepsis/shock liver, AST/ALT downtrending.  RUQ ultrasound unremarkable.  Avoid nephrotoxic agents and follow.     AKI: Suspect hemodynamically mediated-supportive care and follow.  Volume status is relatively stable-check renal ultrasound follow for now.  Alcohol withdrawal: Improved-less tremors-more awake-still somewhat confused.  Last drink was on 3/7.  Continue Ativan per CIWA protocol.   Continue MVI, thiamine, folic acid.  Newly diagnosed chronic systolic heart failure: Suspect either secondary to alcohol or from sepsis.  Cardiology evaluation pending.  Volume status relatively stable.  Hypokalemia/hypomagnesemia: Replete  and recheck  DVT Prophylaxis: Prophylactic Lovenox   Code Status: Full code  Family Communication: None at bedside  Disposition Plan: Remain inpatient  Antimicrobial agents: Anti-infectives (From admission, onward)   Start     Dose/Rate Route Frequency Ordered Stop   12/19/18 1700  penicillin G potassium 12 Million Units in dextrose 5 % 500 mL continuous infusion     12 Million Units 41.7 mL/hr over 12 Hours Intravenous Every 12 hours 12/19/18 1144     12/19/18 0400  vancomycin (VANCOCIN) 1,500 mg in sodium chloride 0.9 % 500 mL IVPB  Status:  Discontinued     1,500 mg 250 mL/hr over 120 Minutes Intravenous Every 12 hours 12/18/18 1530 12/19/18 0901   12/18/18 1600  vancomycin (VANCOCIN) 2,000 mg in sodium chloride 0.9 % 500 mL IVPB     2,000 mg 250 mL/hr over 120 Minutes Intravenous  Once 12/18/18 1530 12/19/18 0748   12/18/18 1530  cefTRIAXone (ROCEPHIN) 2 g in sodium chloride 0.9 % 100 mL IVPB  Status:  Discontinued     2 g 200 mL/hr over 30 Minutes Intravenous Every 24 hours 12/18/18 1519 12/19/18 1115   12/18/18 1530  metroNIDAZOLE (FLAGYL) IVPB 500 mg  Status:  Discontinued     500 mg 100 mL/hr over 60 Minutes Intravenous Every 8 hours 12/18/18 1519 12/19/18 0901     Procedures: None  CONSULTS:  None  Time spent: 25- minutes-Greater than 50% of this time was spent in counseling, explanation of diagnosis, planning of further management, and coordination of care.  MEDICATIONS: Scheduled Meds: . enoxaparin (LOVENOX) injection  40 mg Subcutaneous Q24H  . feeding supplement (ENSURE ENLIVE)  237 mL Oral BID BM  . folic acid  1 mg Oral Daily  .  gabapentin  300 mg Oral BID  . levETIRAcetam  1,000 mg Oral Q12H  . multivitamin with minerals  1 tablet Oral Daily  . nicotine  21 mg Transdermal Daily  . pyridOXINE  100 mg Intravenous Daily  . QUEtiapine  50 mg Oral QHS  . thiamine  100 mg Oral Daily   Continuous Infusions: . 0.9 % NaCl with KCl 40 mEq / L 75 mL/hr  (12/20/18 0151)  . penicillin g continuous IV infusion 12 Million Units (12/20/18 0502)   PRN Meds:.acetaminophen **OR** acetaminophen, ibuprofen, loperamide, LORazepam, LORazepam, metoprolol tartrate, ondansetron **OR** ondansetron (ZOFRAN) IV   PHYSICAL EXAM: Vital signs: Vitals:   12/20/18 0800 12/20/18 0900 12/20/18 1000 12/20/18 1100  BP:      Pulse: 98     Resp: (!) 24 (!) 26 (!) 23 (!) 28  Temp:      TempSrc:      SpO2: 94%     Weight:      Height:       Filed Weights   12/17/18 2300  Weight: 92.8 kg   Body mass index is 29.36 kg/m.   General appearance:Awake, answers most of my questions appropriately.  Less tremors today. Eyes:no scleral icterus. HEENT: Atraumatic and Normocephalic Neck: supple, no JVD. Resp:Good air entry bilaterally,no rales or rhonchi CVS: S1 S2 regular, tachy  GI: Bowel sounds present, Non tender and not distended with no gaurding, rigidity or rebound. Extremities: B/L Lower Ext shows no edema, both legs are warm to touch Neurology:  Non focal Musculoskeletal:No digital cyanosis Skin:No Rash, warm and dry Wounds:N/A  I have personally reviewed following labs and imaging studies  LABORATORY DATA: CBC: Recent Labs  Lab 12/17/18 1850 12/18/18 1321 12/19/18 0356 12/20/18 0332  WBC 5.0 4.8 6.9 5.7  HGB 13.5 13.9 12.7* 11.6*  HCT 39.2 39.9 37.7* 32.4*  MCV 100.5* 98.3 100.8* 99.7  PLT PLATELET CLUMPS NOTED ON SMEAR, COUNT APPEARS DECREASED PLATELET CLUMPS NOTED ON SMEAR, COUNT APPEARS DECREASED 53* 60*    Basic Metabolic Panel: Recent Labs  Lab 12/17/18 1850 12/18/18 1321 12/19/18 0356 12/20/18 0332  NA 137 137 136 137  K 3.6 2.9* 3.2* 3.4*  CL 101 101 103 110  CO2 19* 22 22 21*  GLUCOSE 165* 106* 96 107*  BUN CREATININE 0.75 0.95 0.99 1.53*  CALCIUM 9.6 9.2 8.0* 8.0*  MG  --   --  1.7  --     GFR: Estimated Creatinine Clearance: 77.8 mL/min (A) (by C-G formula based on SCr of 1.53 mg/dL (H)).  Liver  Function Tests: Recent Labs  Lab 12/17/18 1850 12/18/18 1321 12/19/18 0356 12/20/18 0332  AST 83* 138* 2,241* 549*  ALT 51* 77* 750* 512*  ALKPHOS 58 35* 23* 30*  BILITOT 1.3* 1.7* 1.6* 1.6*  PROT 7.7 7.5 6.5 5.6*  ALBUMIN 4.3 3.8 3.2* 2.7*   No results for input(s): LIPASE, AMYLASE in the last 168 hours. Recent Labs  Lab 12/19/18 1128 12/20/18 0332  AMMONIA 28 34    Coagulation Profile: Recent Labs  Lab 12/19/18 0702 12/20/18 0332  INR 1.5* 1.2    Cardiac Enzymes: No results for input(s): CKTOTAL, CKMB, CKMBINDEX, TROPONINI in the last 168 hours.  BNP (last 3 results) No results for input(s): PROBNP in the last 8760 hours.  HbA1C: No results for input(s): HGBA1C in the last 72 hours.  CBG: No results for input(s): GLUCAP in the last 168 hours.  Lipid Profile: No results for input(s): CHOL,  HDL, LDLCALC, TRIG, CHOLHDL, LDLDIRECT in the last 72 hours.  Thyroid Function Tests: No results for input(s): TSH, T4TOTAL, FREET4, T3FREE, THYROIDAB in the last 72 hours.  Anemia Panel: No results for input(s): VITAMINB12, FOLATE, FERRITIN, TIBC, IRON, RETICCTPCT in the last 72 hours.  Urine analysis:    Component Value Date/Time   COLORURINE AMBER (A) 12/18/2018 2155   APPEARANCEUR HAZY (A) 12/18/2018 2155   LABSPEC 1.030 12/18/2018 2155   PHURINE 5.0 12/18/2018 2155   GLUCOSEU NEGATIVE 12/18/2018 2155   HGBUR SMALL (A) 12/18/2018 2155   BILIRUBINUR NEGATIVE 12/18/2018 2155   KETONESUR NEGATIVE 12/18/2018 2155   PROTEINUR 100 (A) 12/18/2018 2155   NITRITE NEGATIVE 12/18/2018 2155   LEUKOCYTESUR NEGATIVE 12/18/2018 2155    Sepsis Labs: Lactic Acid, Venous    Component Value Date/Time   LATICACIDVEN 1.9 12/18/2018 1912    MICROBIOLOGY: Recent Results (from the past 240 hour(s))  Respiratory Panel by PCR     Status: None   Collection Time: 12/18/18 12:18 PM  Result Value Ref Range Status   Adenovirus NOT DETECTED NOT DETECTED Final   Coronavirus 229E  NOT DETECTED NOT DETECTED Final    Comment: (NOTE) The Coronavirus on the Respiratory Panel, DOES NOT test for the novel  Coronavirus (2019 nCoV)    Coronavirus HKU1 NOT DETECTED NOT DETECTED Final   Coronavirus NL63 NOT DETECTED NOT DETECTED Final   Coronavirus OC43 NOT DETECTED NOT DETECTED Final   Metapneumovirus NOT DETECTED NOT DETECTED Final   Rhinovirus / Enterovirus NOT DETECTED NOT DETECTED Final   Influenza A NOT DETECTED NOT DETECTED Final   Influenza B NOT DETECTED NOT DETECTED Final   Parainfluenza Virus 1 NOT DETECTED NOT DETECTED Final   Parainfluenza Virus 2 NOT DETECTED NOT DETECTED Final   Parainfluenza Virus 3 NOT DETECTED NOT DETECTED Final   Parainfluenza Virus 4 NOT DETECTED NOT DETECTED Final   Respiratory Syncytial Virus NOT DETECTED NOT DETECTED Final   Bordetella pertussis NOT DETECTED NOT DETECTED Final   Chlamydophila pneumoniae NOT DETECTED NOT DETECTED Final   Mycoplasma pneumoniae NOT DETECTED NOT DETECTED Final    Comment: Performed at [facility], [address], [place], [place] [address]  Culture, blood (routine x 2)     Status: Abnormal (Preliminary result)   Collection Time: 12/18/18  1:00 PM  Result Value Ref Range Status   Specimen Description BLOOD LEFT HAND  Final   Special Requests   Final    BOTTLES DRAWN AEROBIC ONLY Blood Culture adequate volume   Culture  Setup Time   Final    GRAM POSITIVE COCCI IN CHAINS AEROBIC BOTTLE ONLY CRITICAL RESULT CALLED TO, READ BACK BY AND VERIFIED WITH: J.LADFORD,PHARMD AT 0450 ON 12/19/18 BY G.MCADOO    Culture (A)  Final    GROUP A STREP (S.PYOGENES) ISOLATED SUSCEPTIBILITIES TO FOLLOW HEALTH DEPARTMENT NOTIFIED Performed at [facility], [address] [place], [place] [address]    Report Status PENDING  Incomplete  Blood Culture ID Panel (Reflexed)     Status: Abnormal   Collection Time: 12/18/18  1:00 PM  Result Value Ref Range Status   Enterococcus species NOT DETECTED NOT DETECTED  Final   Listeria monocytogenes NOT DETECTED NOT DETECTED Final   Staphylococcus species NOT DETECTED NOT DETECTED Final   Staphylococcus aureus (BCID) NOT DETECTED NOT DETECTED Final   Streptococcus species DETECTED (A) NOT DETECTED Final    Comment: CRITICAL RESULT CALLED TO, READ BACK BY AND VERIFIED WITH: J.LADFORD,PHARMD AT  0450 ON 12/19/18 BY G.MCADOO    Streptococcus agalactiae NOT DETECTED NOT DETECTED Final   Streptococcus pneumoniae NOT DETECTED NOT DETECTED Final   Streptococcus pyogenes DETECTED (A) NOT DETECTED Final    Comment: CRITICAL RESULT CALLED TO, READ BACK BY AND VERIFIED WITH: J.LADFORD,PHARMD AT 0450 ON 12/19/18 BY G.MCADOO    Acinetobacter baumannii NOT DETECTED NOT DETECTED Final   Enterobacteriaceae species NOT DETECTED NOT DETECTED Final   Enterobacter cloacae complex NOT DETECTED NOT DETECTED Final   Escherichia coli NOT DETECTED NOT DETECTED Final   Klebsiella oxytoca NOT DETECTED NOT DETECTED Final   Klebsiella pneumoniae NOT DETECTED NOT DETECTED Final   Proteus species NOT DETECTED NOT DETECTED Final   Serratia marcescens NOT DETECTED NOT DETECTED Final   Haemophilus influenzae NOT DETECTED NOT DETECTED Final   Neisseria meningitidis NOT DETECTED NOT DETECTED Final   Pseudomonas aeruginosa NOT DETECTED NOT DETECTED Final   Candida albicans NOT DETECTED NOT DETECTED Final   Candida glabrata NOT DETECTED NOT DETECTED Final   Candida krusei NOT DETECTED NOT DETECTED Final   Candida parapsilosis NOT DETECTED NOT DETECTED Final   Candida tropicalis NOT DETECTED NOT DETECTED Final    Comment: Performed at [facility], [address] [place], [place] [address]  Culture, blood (routine x 2)     Status: Abnormal (Preliminary result)   Collection Time: 12/18/18  1:20 PM  Result Value Ref Range Status   Specimen Description BLOOD RIGHT HAND  Final   Special Requests   Final    BOTTLES DRAWN AEROBIC ONLY Blood Culture results may not be optimal due to an  inadequate volume of blood received in culture bottles   Culture  Setup Time   Final    GRAM POSITIVE COCCI IN CHAINS AEROBIC BOTTLE ONLY CRITICAL VALUE NOTED.  VALUE IS CONSISTENT WITH PREVIOUSLY REPORTED AND CALLED VALUE.    Culture (A)  Final    GROUP A STREP (S.PYOGENES) ISOLATED HEALTH DEPARTMENT NOTIFIED Performed at [facility], [address], [place], [place] [address]    Report Status PENDING  Incomplete  Gastrointestinal Panel by PCR , Stool     Status: None   Collection Time: 12/18/18  3:19 PM  Result Value Ref Range Status   Campylobacter species NOT DETECTED NOT DETECTED Final   Plesimonas shigelloides NOT DETECTED NOT DETECTED Final   Salmonella species NOT DETECTED NOT DETECTED Final   Yersinia enterocolitica NOT DETECTED NOT DETECTED Final   Vibrio species NOT DETECTED NOT DETECTED Final   Vibrio cholerae NOT DETECTED NOT DETECTED Final   Enteroaggregative E coli (EAEC) NOT DETECTED NOT DETECTED Final   Enteropathogenic E coli (EPEC) NOT DETECTED NOT DETECTED Final   Enterotoxigenic E coli (ETEC) NOT DETECTED NOT DETECTED Final   Shiga like toxin producing E coli (STEC) NOT DETECTED NOT DETECTED Final   Shigella/Enteroinvasive E coli (EIEC) NOT DETECTED NOT DETECTED Final   Cryptosporidium NOT DETECTED NOT DETECTED Final   Cyclospora cayetanensis NOT DETECTED NOT DETECTED Final   Entamoeba histolytica NOT DETECTED NOT DETECTED Final   Giardia lamblia NOT DETECTED NOT DETECTED Final   Adenovirus F40/41 NOT DETECTED NOT DETECTED Final   Astrovirus NOT DETECTED NOT DETECTED Final   Norovirus GI/GII NOT DETECTED NOT DETECTED Final   Rotavirus A NOT DETECTED NOT DETECTED Final   Sapovirus (I, II, IV, and V) NOT DETECTED NOT DETECTED Final    Comment: Performed at [facility], [address], [place], [place] [address]  Culture, blood (  routine x 2)     Status: None (Preliminary result)   Collection Time: 12/19/18  9:13 AM  Result Value Ref Range  Status   Specimen Description BLOOD RIGHT ANTECUBITAL  Final   Special Requests AEROBIC BOTTLE ONLY Blood Culture adequate volume  Final   Culture   Final    NO GROWTH 1 DAY Performed at [facility], [address], [place], [place] [address]    Report Status PENDING  Incomplete  Culture, blood (routine x 2)     Status: None (Preliminary result)   Collection Time: 12/19/18  9:26 AM  Result Value Ref Range Status   Specimen Description BLOOD RIGHT THUMB  Final   Special Requests AEROBIC BOTTLE ONLY Blood Culture adequate volume  Final   Culture   Final    NO GROWTH 1 DAY Performed at [facility], [address], [place], [place] [address]    Report Status PENDING  Incomplete    RADIOLOGY STUDIES/RESULTS: Dg Chest 2 View  Result Date: 12/17/2018 CLINICAL DATA:  Seizure, fever EXAM: CHEST - 2 VIEW COMPARISON:  09/09/2018 chest radiograph. FINDINGS: Stable cardiomediastinal silhouette with normal heart size. No pneumothorax. No pleural effusion. Lungs appear clear, with no acute consolidative airspace disease and no pulmonary edema. IMPRESSION: No active cardiopulmonary disease. Electronically Signed   By: Delbert Phenix M.D.   On: 12/17/2018 20:47   Ct Head Wo Contrast  Result Date: 12/17/2018 CLINICAL DATA:  Seizure, head trauma EXAM: CT HEAD WITHOUT CONTRAST TECHNIQUE: Contiguous axial images were obtained from the base of the skull through the vertex without intravenous contrast. COMPARISON:  MRI 09/18/2018 FINDINGS: Brain: No acute intracranial abnormality. Specifically, no hemorrhage, hydrocephalus, mass lesion, acute infarction, or significant intracranial injury. Vascular: No hyperdense vessel or unexpected calcification. Skull: No acute calvarial abnormality. Sinuses/Orbits: Visualized paranasal sinuses and mastoids clear. Orbital soft tissues unremarkable. Other: None IMPRESSION: No acute intracranial abnormality. Electronically Signed   By: Charlett Nose M.D.   On:  12/17/2018 20:08   Dg Chest Port 1v Same Day  Result Date: 12/18/2018 CLINICAL DATA:  Shortness of breath EXAM: PORTABLE CHEST 1 VIEW COMPARISON:  December 17, 2018 FINDINGS: The heart size and mediastinal contours are within normal limits. Both lungs are clear. The visualized skeletal structures are unremarkable. IMPRESSION: No active disease. Electronically Signed   By: Gerome Sam III M.D   On: 12/18/2018 16:01   US Abdomen Limited Ruq  Result Date: 12/19/2018 CLINICAL DATA:  Elevated LFTs EXAM: ULTRASOUND ABDOMEN LIMITED RIGHT UPPER QUADRANT COMPARISON:  None. FINDINGS: Gallbladder: Evaluation of the gallbladder is limited. There appears to be a thickened wall measuring up to 5.5 mm. No stones, pericholecystic fluid, or Murphy's sign identified. Common bile duct: Diameter: 4.0 mm Liver: No focal lesion identified. Within normal limits in parenchymal echogenicity. Portal vein is patent on color Doppler imaging with normal direction of blood flow towards the liver. IMPRESSION: 1. The study is limited due to patient condition and difficulty following instructions. The gallbladder is not well assessed as it is contracted. The wall thickening to 5.5 mm could be due to lack of distention. No stones, pericholecystic fluid, or Murphy's sign identified. If there is concern for acute cholecystitis, recommend a HIDA scan. 2. No other abnormalities. Electronically Signed   By: Gerome Sam III M.D   On: 12/19/2018 13:54     LOS: 3 days   Darren Massed, MD  [facility]  If 7PM-7AM, please contact night-coverage  Please page via  [email or web address]  Go to [email or web address] and use [facility]'s universal password to access. If you do not have the password, please contact the hospital operator.  Locate the [facility] provider you are looking for under [facility] and page to a number that you can be directly reached. If you still have difficulty reaching the provider, please page the [facility] (Director on Call)  for the Hospitalists listed on amion for assistance.  12/20/2018, 2:35 PM

## 2018-12-21 ENCOUNTER — Inpatient Hospital Stay (HOSPITAL_COMMUNITY): Payer: BLUE CROSS/BLUE SHIELD

## 2018-12-21 ENCOUNTER — Encounter (HOSPITAL_COMMUNITY): Payer: Self-pay | Admitting: Acute Care

## 2018-12-21 DIAGNOSIS — Z9911 Dependence on respirator [ventilator] status: Secondary | ICD-10-CM

## 2018-12-21 DIAGNOSIS — I428 Other cardiomyopathies: Secondary | ICD-10-CM

## 2018-12-21 DIAGNOSIS — J9601 Acute respiratory failure with hypoxia: Secondary | ICD-10-CM

## 2018-12-21 DIAGNOSIS — L0390 Cellulitis, unspecified: Secondary | ICD-10-CM

## 2018-12-21 DIAGNOSIS — I429 Cardiomyopathy, unspecified: Secondary | ICD-10-CM

## 2018-12-21 HISTORY — PX: CHL IP CC INTUBATION ORDABLE: 112117

## 2018-12-21 LAB — POCT I-STAT 7, (LYTES, BLD GAS, ICA,H+H)
Acid-base deficit: 7 mmol/L — ABNORMAL HIGH (ref 0.0–2.0)
Bicarbonate: 18.7 mmol/L — ABNORMAL LOW (ref 20.0–28.0)
CALCIUM ION: 1.28 mmol/L (ref 1.15–1.40)
HEMATOCRIT: 35 % — AB (ref 39.0–52.0)
Hemoglobin: 11.9 g/dL — ABNORMAL LOW (ref 13.0–17.0)
O2 Saturation: 97 %
Potassium: 4.6 mmol/L (ref 3.5–5.1)
Sodium: 139 mmol/L (ref 135–145)
TCO2: 20 mmol/L — ABNORMAL LOW (ref 22–32)
pCO2 arterial: 36.1 mmHg (ref 32.0–48.0)
pH, Arterial: 7.321 — ABNORMAL LOW (ref 7.350–7.450)
pO2, Arterial: 102 mmHg (ref 83.0–108.0)

## 2018-12-21 LAB — CBC
HEMATOCRIT: 38.9 % — AB (ref 39.0–52.0)
Hemoglobin: 13.6 g/dL (ref 13.0–17.0)
MCH: 35.5 pg — ABNORMAL HIGH (ref 26.0–34.0)
MCHC: 35 g/dL (ref 30.0–36.0)
MCV: 101.6 fL — AB (ref 80.0–100.0)
Platelets: 86 10*3/uL — ABNORMAL LOW (ref 150–400)
RBC: 3.83 MIL/uL — ABNORMAL LOW (ref 4.22–5.81)
RDW: 13.8 % (ref 11.5–15.5)
WBC: 7.5 10*3/uL (ref 4.0–10.5)
nRBC: 0 % (ref 0.0–0.2)

## 2018-12-21 LAB — BLOOD GAS, ARTERIAL
Acid-base deficit: 5.3 mmol/L — ABNORMAL HIGH (ref 0.0–2.0)
Bicarbonate: 18.2 mmol/L — ABNORMAL LOW (ref 20.0–28.0)
Drawn by: 331001
FIO2: 100
O2 Saturation: 98.3 %
Patient temperature: 102.4
pCO2 arterial: 31.1 mmHg — ABNORMAL LOW (ref 32.0–48.0)
pH, Arterial: 7.397 (ref 7.350–7.450)
pO2, Arterial: 122 mmHg — ABNORMAL HIGH (ref 83.0–108.0)

## 2018-12-21 LAB — URINALYSIS, ROUTINE W REFLEX MICROSCOPIC
Bilirubin Urine: NEGATIVE
Glucose, UA: NEGATIVE mg/dL
Ketones, ur: NEGATIVE mg/dL
Leukocytes,Ua: NEGATIVE
Nitrite: NEGATIVE
PROTEIN: NEGATIVE mg/dL
Specific Gravity, Urine: 1.023 (ref 1.005–1.030)
pH: 5 (ref 5.0–8.0)

## 2018-12-21 LAB — CULTURE, BLOOD (ROUTINE X 2): SPECIAL REQUESTS: ADEQUATE

## 2018-12-21 LAB — COMPREHENSIVE METABOLIC PANEL
ALT: 285 U/L — ABNORMAL HIGH (ref 0–44)
AST: 158 U/L — ABNORMAL HIGH (ref 15–41)
Albumin: 2.6 g/dL — ABNORMAL LOW (ref 3.5–5.0)
Alkaline Phosphatase: 45 U/L (ref 38–126)
Anion gap: 6 (ref 5–15)
BILIRUBIN TOTAL: 2.2 mg/dL — AB (ref 0.3–1.2)
BUN: 18 mg/dL (ref 6–20)
CO2: 20 mmol/L — ABNORMAL LOW (ref 22–32)
Calcium: 8.8 mg/dL — ABNORMAL LOW (ref 8.9–10.3)
Chloride: 114 mmol/L — ABNORMAL HIGH (ref 98–111)
Creatinine, Ser: 1.55 mg/dL — ABNORMAL HIGH (ref 0.61–1.24)
GFR calc Af Amer: >60 mL/min (ref >60)
GFR, EST NON AFRICAN AMERICAN: 58 mL/min — AB (ref >60)
Glucose, Bld: 93 mg/dL (ref 70–99)
Potassium: 4.3 mmol/L (ref 3.5–5.1)
Sodium: 140 mmol/L (ref 135–145)
Total Protein: 6.1 g/dL — ABNORMAL LOW (ref 6.5–8.1)

## 2018-12-21 LAB — LACTIC ACID, PLASMA: LACTIC ACID, VENOUS: 0.8 mmol/L (ref 0.5–1.9)

## 2018-12-21 LAB — MRSA PCR SCREENING: MRSA by PCR: NEGATIVE

## 2018-12-21 LAB — BRAIN NATRIURETIC PEPTIDE: B NATRIURETIC PEPTIDE 5: 3126.8 pg/mL — AB (ref 0.0–100.0)

## 2018-12-21 LAB — MAGNESIUM: Magnesium: 1.8 mg/dL (ref 1.7–2.4)

## 2018-12-21 LAB — GLUCOSE, CAPILLARY
Glucose-Capillary: 114 mg/dL — ABNORMAL HIGH (ref 70–99)
Glucose-Capillary: 107 mg/dL — ABNORMAL HIGH (ref 70–99)

## 2018-12-21 MED ORDER — FENTANYL CITRATE (PF) 100 MCG/2ML IJ SOLN: 50.0000 ug | Freq: Once | INTRAMUSCULAR | Status: AC

## 2018-12-21 MED ORDER — CHLORDIAZEPOXIDE HCL 25 MG PO CAPS: 25.0000 mg | ORAL_CAPSULE | Freq: Three times a day (TID) | ORAL | Status: DC

## 2018-12-21 MED ORDER — GABAPENTIN 250 MG/5ML PO SOLN
300.0000 mg | Freq: Two times a day (BID) | ORAL | Status: DC
  Administered 2018-12-21 – 2018-12-22 (×4): 300 mg
  Filled 2018-12-21 (×6): qty 6

## 2018-12-21 MED ORDER — LACTATED RINGERS IV BOLUS
500.0000 mL | Freq: Once | INTRAVENOUS | Status: AC
  Administered 2018-12-21: 500 mL via INTRAVENOUS

## 2018-12-21 MED ORDER — FENTANYL BOLUS VIA INFUSION
50.0000 ug | INTRAVENOUS | Status: DC | PRN
  Administered 2018-12-21 – 2018-12-23 (×5): 50 ug via INTRAVENOUS
  Filled 2018-12-21: qty 50

## 2018-12-21 MED ORDER — MIDAZOLAM HCL 2 MG/2ML IJ SOLN: 2.0000 mg | Freq: Once | INTRAMUSCULAR | Status: AC

## 2018-12-21 MED ORDER — FUROSEMIDE 10 MG/ML IJ SOLN
40.0000 mg | Freq: Once | INTRAMUSCULAR | Status: AC
  Administered 2018-12-21: 40 mg via INTRAVENOUS
  Filled 2018-12-21: qty 4

## 2018-12-21 MED ORDER — VITAMIN B-6 100 MG PO TABS
100.0000 mg | ORAL_TABLET | Freq: Every day | ORAL | Status: DC
  Administered 2018-12-21 – 2018-12-23 (×3): 100 mg
  Filled 2018-12-21 (×3): qty 1

## 2018-12-21 MED ORDER — DEXMEDETOMIDINE HCL IN NACL 400 MCG/100ML IV SOLN: 0.0000 ug/kg/h | INTRAVENOUS | Status: DC

## 2018-12-21 MED ORDER — SENNOSIDES 8.8 MG/5ML PO SYRP
5.0000 mL | ORAL_SOLUTION | Freq: Two times a day (BID) | ORAL | Status: DC | PRN
  Filled 2018-12-21: qty 5

## 2018-12-21 MED ORDER — THIAMINE HCL 100 MG/ML IJ SOLN
100.0000 mg | Freq: Every day | INTRAMUSCULAR | Status: DC
  Administered 2018-12-21: 100 mg via INTRAVENOUS
  Filled 2018-12-21: qty 2

## 2018-12-21 MED ORDER — MIDAZOLAM HCL 2 MG/2ML IJ SOLN: 2.0000 mg | INTRAMUSCULAR | Status: DC | PRN

## 2018-12-21 MED ORDER — LEVETIRACETAM 100 MG/ML PO SOLN
1000.0000 mg | Freq: Two times a day (BID) | ORAL | Status: DC
  Administered 2018-12-21 – 2018-12-23 (×4): 1000 mg
  Filled 2018-12-21 (×6): qty 10

## 2018-12-21 MED ORDER — CHLORDIAZEPOXIDE HCL 25 MG PO CAPS
25.0000 mg | ORAL_CAPSULE | Freq: Three times a day (TID) | ORAL | Status: DC
  Administered 2018-12-21 – 2018-12-23 (×7): 25 mg
  Filled 2018-12-21 (×7): qty 1

## 2018-12-21 MED ORDER — ADULT MULTIVITAMIN LIQUID CH
15.0000 mL | Freq: Every day | ORAL | Status: DC
  Administered 2018-12-21 – 2018-12-22 (×2): 15 mL
  Filled 2018-12-21 (×3): qty 15

## 2018-12-21 MED ORDER — PANTOPRAZOLE SODIUM 40 MG PO PACK
40.0000 mg | PACK | Freq: Every day | ORAL | Status: DC
  Administered 2018-12-22: 40 mg
  Filled 2018-12-21 (×2): qty 20

## 2018-12-21 MED ORDER — MIDAZOLAM HCL 2 MG/2ML IJ SOLN
INTRAMUSCULAR | Status: AC
  Administered 2018-12-21: 2 mg
  Filled 2018-12-21: qty 4

## 2018-12-21 MED ORDER — VITAMIN B-1 100 MG PO TABS
100.0000 mg | ORAL_TABLET | Freq: Every day | ORAL | Status: DC
  Administered 2018-12-22 – 2018-12-23 (×2): 100 mg
  Filled 2018-12-21 (×3): qty 1

## 2018-12-21 MED ORDER — ORAL CARE MOUTH RINSE
15.0000 mL | OROMUCOSAL | Status: DC
  Administered 2018-12-21 – 2018-12-23 (×19): 15 mL via OROMUCOSAL

## 2018-12-21 MED ORDER — BISACODYL 10 MG RE SUPP: 10.0000 mg | Freq: Every day | RECTAL | Status: DC | PRN

## 2018-12-21 MED ORDER — MAGNESIUM SULFATE 2 GM/50ML IV SOLN
2.0000 g | Freq: Once | INTRAVENOUS | Status: AC
  Administered 2018-12-21: 2 g via INTRAVENOUS
  Filled 2018-12-21: qty 50

## 2018-12-21 MED ORDER — LORAZEPAM 2 MG/ML IJ SOLN
1.0000 mg | INTRAMUSCULAR | Status: DC | PRN
  Administered 2018-12-22 – 2018-12-23 (×3): 2 mg via INTRAVENOUS
  Administered 2018-12-23: 1 mg via INTRAVENOUS
  Administered 2018-12-24 (×2): 2 mg via INTRAVENOUS
  Filled 2018-12-21 (×7): qty 1

## 2018-12-21 MED ORDER — PANTOPRAZOLE SODIUM 40 MG IV SOLR
40.0000 mg | Freq: Every day | INTRAVENOUS | Status: DC
  Administered 2018-12-21: 40 mg via INTRAVENOUS
  Filled 2018-12-21: qty 40

## 2018-12-21 MED ORDER — FENTANYL CITRATE (PF) 100 MCG/2ML IJ SOLN
50.0000 ug | Freq: Once | INTRAMUSCULAR | Status: AC
  Administered 2018-12-21: 50 ug via INTRAVENOUS

## 2018-12-21 MED ORDER — ALBUTEROL SULFATE (2.5 MG/3ML) 0.083% IN NEBU: 2.5000 mg | INHALATION_SOLUTION | RESPIRATORY_TRACT | Status: DC | PRN

## 2018-12-21 MED ORDER — LACTATED RINGERS IV SOLN
INTRAVENOUS | Status: DC
  Administered 2018-12-21 – 2018-12-27 (×6): via INTRAVENOUS

## 2018-12-21 MED ORDER — FENTANYL 2500MCG IN NS 250ML (10MCG/ML) PREMIX INFUSION
25.0000 ug/h | INTRAVENOUS | Status: DC
  Administered 2018-12-21: 50 ug/h via INTRAVENOUS
  Administered 2018-12-22 – 2018-12-23 (×4): 400 ug/h via INTRAVENOUS
  Filled 2018-12-21 (×5): qty 250

## 2018-12-21 MED ORDER — ETOMIDATE 2 MG/ML IV SOLN
20.0000 mg | Freq: Once | INTRAVENOUS | Status: AC
  Administered 2018-12-21: 20 mg via INTRAVENOUS

## 2018-12-21 MED ORDER — DEXMEDETOMIDINE HCL IN NACL 400 MCG/100ML IV SOLN
0.4000 ug/kg/h | INTRAVENOUS | Status: DC
  Administered 2018-12-21: 0.6 ug/kg/h via INTRAVENOUS
  Administered 2018-12-21: 0.2 ug/kg/h via INTRAVENOUS
  Administered 2018-12-21: 0.5 ug/kg/h via INTRAVENOUS
  Administered 2018-12-22: 1.2 ug/kg/h via INTRAVENOUS
  Administered 2018-12-22: 0.7 ug/kg/h via INTRAVENOUS
  Administered 2018-12-22: 0.6 ug/kg/h via INTRAVENOUS
  Administered 2018-12-22 – 2018-12-23 (×4): 1.2 ug/kg/h via INTRAVENOUS
  Filled 2018-12-21 (×11): qty 100

## 2018-12-21 MED ORDER — FENTANYL CITRATE (PF) 100 MCG/2ML IJ SOLN
INTRAMUSCULAR | Status: AC
  Filled 2018-12-21: qty 2

## 2018-12-21 MED ORDER — QUETIAPINE FUMARATE 50 MG PO TABS
50.0000 mg | ORAL_TABLET | Freq: Every day | ORAL | Status: DC
  Administered 2018-12-21 – 2018-12-22 (×2): 50 mg
  Filled 2018-12-21 (×3): qty 1

## 2018-12-21 MED ORDER — ROCURONIUM BROMIDE 50 MG/5ML IV SOLN
80.0000 mg | Freq: Once | INTRAVENOUS | Status: AC
  Administered 2018-12-21: 80 mg via INTRAVENOUS

## 2018-12-21 MED ORDER — FOLIC ACID 1 MG PO TABS
1.0000 mg | ORAL_TABLET | Freq: Every day | ORAL | Status: DC
  Administered 2018-12-21 – 2018-12-23 (×3): 1 mg
  Filled 2018-12-21 (×3): qty 1

## 2018-12-21 MED ORDER — CHLORHEXIDINE GLUCONATE 0.12% ORAL RINSE (MEDLINE KIT)
15.0000 mL | Freq: Two times a day (BID) | OROMUCOSAL | Status: DC
  Administered 2018-12-21 – 2018-12-23 (×5): 15 mL via OROMUCOSAL

## 2018-12-21 NOTE — Progress Notes (Signed)
Patient's blood pressure remains low despite precedx and fentanyl off temp 101.7 Dr Alvia Grove informed again Lactated Ringers 500 mls started per orders

## 2018-12-21 NOTE — Progress Notes (Addendum)
Upon assessment, patient found to be delirious, in 4-point restraints (VO per Dr. Jerral Ralph d/t agitation, kicking, etc), and unable to communicate.  On non-re-breathing mask and tachypneic with respirations 38-48.  No tremors noted, but in obvious neurological decline.  Patient does moan to painful stimuli.  Skin very warm to touch with temp . 102.  MD at bedside as well as parents.  Dr, Jerral Ralph informed parents of imminent transfer to ICU.

## 2018-12-21 NOTE — Progress Notes (Signed)
Received patient from 5 W on 100% NRB tachypneic at 46 . Patient arrouses to pain and mumbles  Output by Drain (mL) 12/19/18 0701 - 12/19/18 1900 12/19/18 1901 - 12/20/18 0700 12/20/18 0701 - 12/20/18 1900 12/20/18 1901 - 12/21/18 0700 12/21/18 0701 - 12/21/18 8144  Requested LDAs do not have output data documented.   Mannem at bedside to intubate

## 2018-12-21 NOTE — Consult Note (Signed)
NAME:  Darren Diaz, MRN:  [id number], DOB:  1986-07-11, LOS: 4 ADMISSION DATE:  12/17/2018, CONSULTATION DATE:  12/21/18 REFERRING MD:  Jerral Ralph - [facility], CHIEF COMPLAINT:  Alcohol withdrawal, sepsis   Brief History   33 yo M admitted 3/7 after fall related to EtOH abuse. Over the weekend, patient became septic, growing group A strep. Patient worsening EtH withdrawal, increased TMax.  On morning of 3/11 patient acute respiratory decline, on 100% non-rebreather. PCCM consulted.   History of present illness   HPI per chart review, patient encephalopathic at time of consultation. 34 year old M with PMH EtOH abuse, seizure, who presented 12/17/18 after a friend heard the patient fall, and discovered him be seizing. The seizure lasted several minutes and the patient exhibited a post-ictal period. The patient had a recent history of nausea and vomiting several times prior to seizure. The patient denied abdominal pain, diarrhea. Last EtOH intake 3/7.   Past Medical History    Significant Hospital Events   3/7 admitted s/p seizure 3/9 Group A strep RLE cellulitis  3/11 transferred to ICU for worsening EtOH withdrawal, acute respiratory failure, sepsis   Consults:  Cardiology ID  PCCM  Procedures:  3/11 intubated >>>   Significant Diagnostic Tests:  3/9 ECHO> LVEF 35%, diffuse hypokinesis  3/8 RUQ US> poor visualization of gallbladder, appearance of thickened wall 5.35mm.  3/10 Renal US> no hydronephrosis, normal appearing bladder. No focal liver abnormalities  3/11 CXR> ETT placement, no pneumothorax   Micro Data:  3/8> group A strep (strep pyogenes) BCx  3/8 GI panel> negative 3/11 MRSA>>>   Antimicrobials:  PCN 3/9>>>  Interim history/subjective:   Transferred to ICU for acute respiratory failure in setting of worsening EtOH withdrawal, sepsis. Intubated upon arrival to ICU   Objective   Blood pressure (!) 155/106, pulse (!) 120, temperature 99.8 F (37.7 C), temperature source  Axillary, resp. rate (!) 32, height 5\' 10"  (1.778 m), weight 92.8 kg, SpO2 99 %.        Intake/Output Summary (Last 24 hours) at 12/21/2018 0810 Last data filed at 12/21/2018 6283 Gross per 24 hour  Intake 716.08 ml  Output 1550 ml  Net -833.92 ml   Filed Weights   12/17/18 2300  Weight: 92.8 kg    Examination: General: WDWN young adult male, intubated, sedated, NAD  HENT: NCAT, ETT secure, patent nares, pink moist mucus membranes  Lungs: bilateral rhonchi, symmetrical lung expansion, no accessory muscle recruitment  Cardiovascular: Tachycardia, 2+ radial pulses, no JVD  Abdomen: soft, round, bowel sounds x4  Extremities: Moves BUE BLE spontaneously, RLL erythematous over shin consistent with known cellulitis. BUE BLE 1+ edema  Neuro: Sedated, PERRL, prior to intubation following simply commands  GU: Foley collecting yellow clear urine   Resolved Hospital Problem list     Assessment & Plan:   Acute Respiratory Failure with hypoxia -In setting of EtOH withdrawal, sepsis -pulmonary edema appreciated on CXR, given lasix by hospitalist prior to transfer  -Intubated 3/11 -Pulmonary  P -Continue mechanical ventilatory support -ABG in 1 hour -CXR in AM -Strict I/O  -Sedation: fentanyl gtt, precedex gtt, PRN ativan  -VAP prevention, pulmonary hygiene   Acute Encephalopathy  -likely multifactorial secondary to sepsis, EtOH withdrawal, CNS depressing medications, history of seizure  -CTH negative for acute abnormalities -EEG no seizure activity  P -Continue abx for sepsis as below - Precedex, ativan, librium for EtOH withdrawal as below -Continue seroquel  -RASS goal 0 to -1  Alcohol Abuse Disorder with  withdrawal P -ICU CIWA protocol  -Starting Precedex now that in ICU -Precedex gtt, PRN ativan, librium  -multivitamin, thiamine, folate  -seizure precautions   Sepsis due to streptococcus pyogenes bacteremia -RLL cellulitis  -ECHO negative for endocarditis    -associated tachycardia  P -ID following appreciate recommendations -Continue PCN  -Trend WBC, fever, UOP  -metop PRN for tachycardia   Seizure disorder -non-compliant with outpatient Keppra -witnessed seizure 3/7 prior to ED presentation P -Continue IV Keppra   Systolic heart failure -newly diagnosed -ECHO EF 35% P -Cardiology consulted -BNP pending -s/p 40mg  Lasix 3/11 in setting of acute pulmonary edema -strict I/O  -daily weight   Acute Kidney Injury -likely in setting of recent hemodynamic instability -Renal US unremarkable  P -Strict I/O -Monitor and trend BMP -Minimize nephrotoxic agents   Acute Liver injury with transaminitis  -elevated LFTs down-trending -likely in setting of sepsis/shock liver -RUQ US unremarkable for hepatic findings  P -Given that LFTs are improving and RUQ [place] neg, feel that it is ok to monitor and trend LFTs PRN   Nutrition and Electrolyte abnormalities -?chronic malnutrition in setting of EtOH abuse -hypokalemia, hypomagnesemia  P -Continue micronutrient supplementation as above -monitor electrolytes, replete PRN   Tobacco use disorder P Continue nicotine patch  Best practice:  Diet: NPO  Pain/Anxiety/Delirium protocol (if indicated): Precedex, Fentanyl, Ativan  VAP protocol (if indicated): yes DVT prophylaxis: lovenox GI prophylaxis: protonix Glucose control: monitor  Mobility: bedrest  Code Status: Full  Family Communication: husband and wife updated at bedside  Disposition: Continue ICU level of care   Labs   CBC: Recent Labs  Lab 12/17/18 1850 12/18/18 1321 12/19/18 0356 12/20/18 0332 12/21/18 0335  WBC 5.0 4.8 6.9 5.7 7.5  HGB 13.5 13.9 12.7* 11.6* 13.6  HCT 39.2 39.9 37.7* 32.4* 38.9*  MCV 100.5* 98.3 100.8* 99.7 101.6*  PLT PLATELET CLUMPS NOTED ON SMEAR, COUNT APPEARS DECREASED PLATELET CLUMPS NOTED ON SMEAR, COUNT APPEARS DECREASED 53* 60* 86*    Basic Metabolic Panel: Recent Labs  Lab  12/17/18 1850 12/18/18 1321 12/19/18 0356 12/20/18 0332 12/21/18 0335  NA 137 137 136 137 140  K 3.6 2.9* 3.2* 3.4* 4.3  CL 101 101 103 110 114*  CO2 19* 22 22 21* 20*  GLUCOSE 165* 106* 96 107* 93  BUN 6 8 10 19 18   CREATININE 0.75 0.95 0.99 1.53* 1.55*  CALCIUM 9.6 9.2 8.0* 8.0* 8.8*  MG  --   --  1.7  --  1.8   GFR: Estimated Creatinine Clearance: 76.8 mL/min (A) (by C-G formula based on SCr of 1.55 mg/dL (H)). Recent Labs  Lab 12/18/18 1321 12/18/18 1526 12/18/18 1912 12/19/18 0356 12/20/18 0332 12/21/18 0335  WBC 4.8  --   --  6.9 5.7 7.5  LATICACIDVEN  --  2.2* 1.9  --   --   --     Liver Function Tests: Recent Labs  Lab 12/17/18 1850 12/18/18 1321 12/19/18 0356 12/20/18 0332 12/21/18 0335  AST 83* 138* 2,241* 549* 158*  ALT 51* 77* 750* 512* 285*  ALKPHOS 58 35* 23* 30* 45  BILITOT 1.3* 1.7* 1.6* 1.6* 2.2*  PROT 7.7 7.5 6.5 5.6* 6.1*  ALBUMIN 4.3 3.8 3.2* 2.7* 2.6*   No results for input(s): LIPASE, AMYLASE in the last 168 hours. Recent Labs  Lab 12/19/18 1128 12/20/18 0332  AMMONIA 28 34    ABG    Component Value Date/Time   PHART 7.397 12/21/2018 0715   PCO2ART 31.1 (L) 12/21/2018  0715   PO2ART 122 (H) 12/21/2018 0715   HCO3 18.2 (L) 12/21/2018 0715   TCO2 26 09/08/2018 0608   ACIDBASEDEF 5.3 (H) 12/21/2018 0715   O2SAT 98.3 12/21/2018 0715     Coagulation Profile: Recent Labs  Lab 12/19/18 0702 12/20/18 0332  INR 1.5* 1.2    Cardiac Enzymes: No results for input(s): CKTOTAL, CKMB, CKMBINDEX, TROPONINI in the last 168 hours.  HbA1C: Hgb A1c MFr Bld  Date/Time Value Ref Range Status  09/14/2018 04:28 AM 4.1 (L) 4.8 - 5.6 % Final    Comment:    (NOTE) Pre diabetes:          5.7%-6.4% Diabetes:              >6.4% Glycemic control for   <7.0% adults with diabetes     CBG: No results for input(s): GLUCAP in the last 168 hours.  Review of Systems:   Unable to obtain due to acute encephalopathy   Past Medical History   He,  has a past medical history of Alcohol abuse, S/P PDA repair (age 32), Seizures (HCC) (08/2018), and Tobacco abuse.   Surgical History    Past Surgical History:  Procedure Laterality Date  . 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 He has never used smokeless tobacco. He reports current alcohol use of about 6.0 standard drinks of alcohol per week. He reports that he does not use drugs.   Family History   His family history includes Fibromyalgia in his mother.   Allergies No Known Allergies   Home Medications  Prior to Admission medications   Medication Sig Start Date End Date Taking? Authorizing Provider  folic acid (FOLVITE) 1 MG tablet Take 1 tablet (1 mg total) by mouth daily. 09/22/18  Yes Narda Bonds, MD  gabapentin (NEURONTIN) 600 MG tablet Take 0.5 tablets (300 mg total) by mouth 2 (two) times daily. 09/21/18  Yes Narda Bonds, MD  levETIRAcetam (KEPPRA) 1000 MG tablet Take 1 tablet (1,000 mg total) by mouth 2 (two) times daily. 09/28/18  Yes Penumalli, Glenford Bayley, MD  Multiple Vitamin (MULTIVITAMIN WITH MINERALS) TABS tablet Take 1 tablet by mouth daily. 09/22/18  Yes Narda Bonds, MD  pyridOXINE (VITAMIN B-6) 100 MG tablet Take 100 mg by mouth daily.   Yes [provider]  QUEtiapine (SEROQUEL) 50 MG tablet Take 1 tablet (50 mg total) by mouth at bedtime. 09/21/18  Yes Narda Bonds, MD  cloNIDine (CATAPRES) 0.1 MG tablet Take 1 tablet (0.1 mg total) by mouth 3 (three) times daily for 1 day, THEN 1 tablet (0.1 mg total) 2 (two) times daily for 1 day, THEN 1 tablet (0.1 mg total) daily for 1 day. 09/21/18 09/24/18  Narda Bonds, MD  feeding supplement, ENSURE ENLIVE, (ENSURE ENLIVE) LIQD Take 237 mLs by mouth 2 (two) times daily between meals. 09/22/18   Narda Bonds, MD     Critical care time: 50 minutes      Tessie Fass MSN, AGACNP-BC [facility] Pulmonary/Critical Care  Medicine 12/21/2018, 9:35 AM

## 2018-12-21 NOTE — Procedures (Signed)
Intubation Procedure Note Devine Scurto [id number] 03-12-1986  Procedure: Intubation Indications: Airway protection and maintenance  Procedure Details Consent: Risks of procedure as well as the alternatives and risks of each were explained to the (patient/caregiver).  Consent for procedure obtained. Time Out: Verified patient identification, verified procedure, site/side was marked, verified correct patient position, special equipment/implants available, medications/allergies/relevent history reviewed, required imaging and test results available.  Performed  Maximum sterile technique was used including gloves, hand hygiene and mask.  4 Glidescope     Evaluation Hemodynamic Status: BP stable throughout; O2 sats: stable throughout Patient's Current Condition: stable Complications: No apparent complications Patient did tolerate procedure well. Chest X-ray ordered to verify placement.  CXR: pending.   Lanier Clam 12/21/2018

## 2018-12-21 NOTE — Significant Event (Signed)
Rapid Response Event Note  Overview: Respiratory Distress on NRB  Initial Focused Assessment: Called by nurse about patient having increased RR, increased WOB, and now is on a NRB. Per RN, this started around 0620. RN had called RT, per nurse, oxygen saturations were 82-84%. When I arrived, patient was 100% on NRB 15L, RR upper 40s, + use of accessory muscles, + diaphoretic, very warm to touch, temperature of 102.4 (rectal). BP 155/106, HR in the 120-130s. + Snoring respirations at times, lung sounds - diminished throughout, + rhonchi. Neuro: very hard to arouse, moans to painful stimuli. Received Ativan 2 mg IV at around 0600 per RN for CIWA.  Interventions: -- STAT ABG  -- STAT CXR  Plan of Care: -- I paged Dr. Jerral Ralph at 0700, updated him on the patient's condition. MD came to the bedside. ABG and CXR were ordered. MD consulted PCCM. Patient will transfer to ICU - 38M -- Care transitioned to day time RR RN.   Event Summary:    at    Call Time 0647 Arrival Time 048 End Time 0745  Arlina Sabina R

## 2018-12-21 NOTE — Progress Notes (Signed)
While rounding on patient observed patient oxygen sat in the 82-84 with respiratory in the 30-40 with patient using his accessories muscle. Call respiratory and patient was put on nonrebreather which got his oxygen to 96-97, but his respiration was still elevated so call rapid. Rapid nurse and Dr. Jerral Ralph at bed side, patient transfer to ICU.

## 2018-12-21 NOTE — Progress Notes (Signed)
Patient's Blood pressure decreased to 67/43 .precedex decreased and fentanyl decreased Dr Alvia Grove informed

## 2018-12-21 NOTE — Progress Notes (Signed)
[facility]   Reason for visit: Follow up on bacteremia  Interval History: now intubated in the ICU.  Repeat blood cultures ngtd; afebrile, WBC wnl. History unobtainable from the patient   Physical Exam: Constitutional:  Vitals:   12/21/18 0825 12/21/18 0830  BP: (!) 148/105   Pulse: (!) 129   Resp: (!) 50   Temp:    SpO2: 94% 94%   patient appears in NAD Eyes: anicteric HENT: +ET Respiratory: respiratory effort on vent; CTA B, anterior exam Cardiovascular: tachy RR GI: soft, nt, nd  Review of Systems: Unable to be assessed due to patient factors  Lab Results  Component Value Date   WBC 7.5 12/21/2018   HGB 13.6 12/21/2018   HCT 38.9 (L) 12/21/2018   MCV 101.6 (H) 12/21/2018   PLT 86 (L) 12/21/2018    Lab Results  Component Value Date   CREATININE 1.55 (H) 12/21/2018   BUN 18 12/21/2018   NA 140 12/21/2018   K 4.3 12/21/2018   CL 114 (H) 12/21/2018   CO2 20 (L) 12/21/2018    Lab Results  Component Value Date   ALT 285 (H) 12/21/2018   AST 158 (H) 12/21/2018   ALKPHOS 45 12/21/2018     Microbiology: Recent Results (from the past 240 hour(s))  Respiratory Panel by PCR     Status: None   Collection Time: 12/18/18 12:18 PM  Result Value Ref Range Status   Adenovirus NOT DETECTED NOT DETECTED Final   Coronavirus 229E NOT DETECTED NOT DETECTED Final    Comment: (NOTE) The Coronavirus on the Respiratory Panel, DOES NOT test for the novel  Coronavirus (2019 nCoV)    Coronavirus HKU1 NOT DETECTED NOT DETECTED Final   Coronavirus NL63 NOT DETECTED NOT DETECTED Final   Coronavirus OC43 NOT DETECTED NOT DETECTED Final   Metapneumovirus NOT DETECTED NOT DETECTED Final   Rhinovirus / Enterovirus NOT DETECTED NOT DETECTED Final   Influenza A NOT DETECTED NOT DETECTED Final   Influenza B NOT DETECTED NOT DETECTED Final   Parainfluenza Virus 1 NOT DETECTED NOT DETECTED Final   Parainfluenza Virus 2 NOT DETECTED NOT DETECTED Final   Parainfluenza Virus 3 NOT DETECTED NOT DETECTED Final   Parainfluenza Virus 4 NOT DETECTED NOT DETECTED Final   Respiratory Syncytial Virus NOT DETECTED NOT DETECTED Final   Bordetella pertussis NOT DETECTED NOT DETECTED Final   Chlamydophila pneumoniae NOT DETECTED NOT DETECTED Final   Mycoplasma pneumoniae NOT DETECTED NOT DETECTED Final    Comment: Performed at [facility], [address], [place], [place] [address]  Culture, blood (routine x 2)     Status: Abnormal   Collection Time: 12/18/18  1:00 PM  Result Value Ref Range Status   Specimen Description BLOOD LEFT HAND  Final   Special Requests   Final    BOTTLES DRAWN AEROBIC ONLY Blood Culture adequate volume   Culture  Setup Time   Final    GRAM POSITIVE COCCI IN CHAINS AEROBIC BOTTLE ONLY CRITICAL RESULT CALLED TO, READ BACK BY AND VERIFIED WITH: J.LADFORD,PHARMD AT 0450 ON 12/19/18 BY G.MCADOO    Culture (A)  Final    GROUP A STREP (S.PYOGENES) ISOLATED HEALTH DEPARTMENT NOTIFIED Performed at [facility], [address], [place], [place] [address]    Report Status 12/21/2018 FINAL  Final   Organism ID, Bacteria GROUP A STREP (S.PYOGENES) ISOLATED  Final      Susceptibility   Group a strep (s.pyogenes)  isolated - MIC*    PENICILLIN <=0.06 SENSITIVE Sensitive     CEFTRIAXONE <=0.12 SENSITIVE Sensitive     ERYTHROMYCIN 2 RESISTANT Resistant     LEVOFLOXACIN <=0.25 SENSITIVE Sensitive     VANCOMYCIN <=0.12 SENSITIVE Sensitive     * GROUP A STREP (S.PYOGENES) ISOLATED  Blood Culture ID Panel (Reflexed)     Status: Abnormal   Collection Time: 12/18/18  1:00 PM  Result Value Ref Range Status   Enterococcus species NOT DETECTED NOT DETECTED Final   Listeria monocytogenes NOT DETECTED NOT DETECTED Final   Staphylococcus species NOT DETECTED NOT DETECTED Final   Staphylococcus aureus (BCID) NOT DETECTED NOT DETECTED Final   Streptococcus species DETECTED (A) NOT DETECTED Final    Comment: CRITICAL RESULT CALLED  TO, READ BACK BY AND VERIFIED WITH: J.LADFORD,PHARMD AT 0450 ON 12/19/18 BY G.MCADOO    Streptococcus agalactiae NOT DETECTED NOT DETECTED Final   Streptococcus pneumoniae NOT DETECTED NOT DETECTED Final   Streptococcus pyogenes DETECTED (A) NOT DETECTED Final    Comment: CRITICAL RESULT CALLED TO, READ BACK BY AND VERIFIED WITH: J.LADFORD,PHARMD AT 0450 ON 12/19/18 BY G.MCADOO    Acinetobacter baumannii NOT DETECTED NOT DETECTED Final   Enterobacteriaceae species NOT DETECTED NOT DETECTED Final   Enterobacter cloacae complex NOT DETECTED NOT DETECTED Final   Escherichia coli NOT DETECTED NOT DETECTED Final   Klebsiella oxytoca NOT DETECTED NOT DETECTED Final   Klebsiella pneumoniae NOT DETECTED NOT DETECTED Final   Proteus species NOT DETECTED NOT DETECTED Final   Serratia marcescens NOT DETECTED NOT DETECTED Final   Haemophilus influenzae NOT DETECTED NOT DETECTED Final   Neisseria meningitidis NOT DETECTED NOT DETECTED Final   Pseudomonas aeruginosa NOT DETECTED NOT DETECTED Final   Candida albicans NOT DETECTED NOT DETECTED Final   Candida glabrata NOT DETECTED NOT DETECTED Final   Candida krusei NOT DETECTED NOT DETECTED Final   Candida parapsilosis NOT DETECTED NOT DETECTED Final   Candida tropicalis NOT DETECTED NOT DETECTED Final    Comment: Performed at [facility], [address] [place], [place] [address]  Culture, blood (routine x 2)     Status: Abnormal   Collection Time: 12/18/18  1:20 PM  Result Value Ref Range Status   Specimen Description BLOOD RIGHT HAND  Final   Special Requests   Final    BOTTLES DRAWN AEROBIC ONLY Blood Culture results may not be optimal due to an inadequate volume of blood received in culture bottles   Culture  Setup Time   Final    GRAM POSITIVE COCCI IN CHAINS AEROBIC BOTTLE ONLY CRITICAL VALUE NOTED.  VALUE IS CONSISTENT WITH PREVIOUSLY REPORTED AND CALLED VALUE.    Culture (A)  Final    GROUP A STREP (S.PYOGENES) ISOLATED  SUSCEPTIBILITIES PERFORMED ON PREVIOUS CULTURE WITHIN THE LAST 5 DAYS. HEALTH DEPARTMENT NOTIFIED Performed at [facility], [address] [place], [place] [address]    Report Status 12/21/2018 FINAL  Final  Gastrointestinal Panel by PCR , Stool     Status: None   Collection Time: 12/18/18  3:19 PM  Result Value Ref Range Status   Campylobacter species NOT DETECTED NOT DETECTED Final   Plesimonas shigelloides NOT DETECTED NOT DETECTED Final   Salmonella species NOT DETECTED NOT DETECTED Final   Yersinia enterocolitica NOT DETECTED NOT DETECTED Final   Vibrio species NOT DETECTED NOT DETECTED Final   Vibrio cholerae NOT DETECTED NOT DETECTED Final   Enteroaggregative E coli (EAEC) NOT DETECTED NOT DETECTED  Final   Enteropathogenic E coli (EPEC) NOT DETECTED NOT DETECTED Final   Enterotoxigenic E coli (ETEC) NOT DETECTED NOT DETECTED Final   Shiga like toxin producing E coli (STEC) NOT DETECTED NOT DETECTED Final   Shigella/Enteroinvasive E coli (EIEC) NOT DETECTED NOT DETECTED Final   Cryptosporidium NOT DETECTED NOT DETECTED Final   Cyclospora cayetanensis NOT DETECTED NOT DETECTED Final   Entamoeba histolytica NOT DETECTED NOT DETECTED Final   Giardia lamblia NOT DETECTED NOT DETECTED Final   Adenovirus F40/41 NOT DETECTED NOT DETECTED Final   Astrovirus NOT DETECTED NOT DETECTED Final   Norovirus GI/GII NOT DETECTED NOT DETECTED Final   Rotavirus A NOT DETECTED NOT DETECTED Final   Sapovirus (I, II, IV, and V) NOT DETECTED NOT DETECTED Final    Comment: Performed at [facility], [address] [place], [place] [address]  Culture, blood (routine x 2)     Status: None (Preliminary result)   Collection Time: 12/19/18  9:13 AM  Result Value Ref Range Status   Specimen Description BLOOD RIGHT ANTECUBITAL  Final   Special Requests AEROBIC BOTTLE ONLY Blood Culture adequate volume  Final   Culture   Final    NO GROWTH 2 DAYS Performed at [facility],  [address], [place], [place] [address]    Report Status PENDING  Incomplete  Culture, blood (routine x 2)     Status: None (Preliminary result)   Collection Time: 12/19/18  9:26 AM  Result Value Ref Range Status   Specimen Description BLOOD RIGHT THUMB  Final   Special Requests AEROBIC BOTTLE ONLY Blood Culture adequate volume  Final   Culture   Final    NO GROWTH 2 DAYS Performed at [facility], [address], [place], [place] [address]    Report Status PENDING  Incomplete    Impression/Plan:  1. Group A Strep bacteremia - clearing.  On penicillin and will continue.    2.  Alcohol withdrawal - now intubated for airway protection.  On precedex  3.  Cellulitis - improving on current therapy.   4.  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

## 2018-12-21 NOTE — Progress Notes (Signed)
Removed patient restraint due to patient calm and patient  mother stated that she wanted restraint remove because it make the patient more agitate, therefore did not renew restraint order and released all restrain.

## 2018-12-21 NOTE — Progress Notes (Signed)
Report given to Acey Lav, RN.  Patient transported to 2M14 by bed accompanied by 2 nurses on monitor.

## 2018-12-21 NOTE — Progress Notes (Signed)
PROGRESS NOTE        PATIENT DETAILS Name: Darren Diaz Age: 33 y.o. Sex: male Date of Birth: 04/05/1986 Admit Date: 12/17/2018 Admitting Physician Hillary Bow, DO ZOX:[id number], No Pcp Per  Brief Narrative: Patient is a 33 y.o. male with history of alcohol use, seizure disorder-presenting with breakthrough seizure. Apparently patient has been cutting down his ETOH use.  On further evaluation-found to have fever and sepsis pathophysiology secondary to Streptococcus pyogenes bacteremia and right lower extremity cellulitis.  Further hospital course complicated by worsening metabolic encephalopathy and development of delirium tremens. See below for further details  Subjective: Very confused, tachycardic, tachypneic-fever to 102 this morning.  Looks markedly worse compared to yesterday.  100% nonrebreather mask.  Assessment/Plan: Sepsis secondary to Streptococcus pyogenes bacteremia and right lower extremity cellulitis: Unfortunately continues to be febrile-very tachypneic/tachycardic (probably secondary to DTs) however repeat blood culture on 3/9 was negative.  Infectious disease following and remains on penicillin G.  TTE was negative for endocarditis.   Acute hypoxic respiratory failure: Secondary to decompensated systolic heart failure, probable aspiration pneumonia-ongoing delirium tremens.  Continue 100% nonrebreather mask-given 1 dose of IV Lasix-KVO all IV fluids-ICU team consulted for possible intubation.  Acute metabolic encephalopathy: Multifactorial-secondary to sepsis in the setting of bacteremia, delirium tremens, hypoxia, use of Ativan for DTs.  Remains very confused and in restraints.  EEG without seizures, CT head negative for acute abnormalities.  Ammonia levels are within normal limits.  Seems to be accumulating secretions-ICU team consulted for possible intubation as he is probably not able to protect his airway.  Alcohol withdrawal with development  of DTs: Transition to delirium tremens- in spite of being on Ativan per stepdown CIWA protocol.  Currently not protecting airway-ICU consult requested for intubation.  Seizure disorder: Continue IV Keppra-remains on IV Ativan per CIWA protocol.  EEG negative for seizures.  Transaminitis: Likely secondary to sepsis/shock liver, AST/ALT downtrending.  RUQ ultrasound unremarkable.   AKI: Suspect hemodynamically mediated-supportive care and follow.  Volume status is relatively stable-check renal ultrasound follow for now.  Newly diagnosed chronic systolic heart failure: Suspect either secondary to alcohol or from sepsis.  Cardiology following.  Appears to have developed pulmonary edema on chest x-ray this morning-1 dose of IV Lasix given.  Hypokalemia/hypomagnesemia: Continue to follow-replete and recheck  DVT Prophylaxis: Prophylactic Lovenox   Code Status: Full code  Family Communication: Mother and father at bedside  Disposition Plan: Remain inpatient-transferring to ICU (orders written)  Antimicrobial agents: Anti-infectives (From admission, onward)   Start     Dose/Rate Route Frequency Ordered Stop   12/19/18 1700  penicillin G potassium 12 Million Units in dextrose 5 % 500 mL continuous infusion     12 Million Units 41.7 mL/hr over 12 Hours Intravenous Every 12 hours 12/19/18 1144     12/19/18 0400  vancomycin (VANCOCIN) 1,500 mg in sodium chloride 0.9 % 500 mL IVPB  Status:  Discontinued     1,500 mg 250 mL/hr over 120 Minutes Intravenous Every 12 hours 12/18/18 1530 12/19/18 0901   12/18/18 1600  vancomycin (VANCOCIN) 2,000 mg in sodium chloride 0.9 % 500 mL IVPB     2,000 mg 250 mL/hr over 120 Minutes Intravenous  Once 12/18/18 1530 12/19/18 0748   12/18/18 1530  cefTRIAXone (ROCEPHIN) 2 g in sodium chloride 0.9 % 100 mL IVPB  Status:  Discontinued  2 g 200 mL/hr over 30 Minutes Intravenous Every 24 hours 12/18/18 1519 12/19/18 1115   12/18/18 1530  metroNIDAZOLE  (FLAGYL) IVPB 500 mg  Status:  Discontinued     500 mg 100 mL/hr over 60 Minutes Intravenous Every 8 hours 12/18/18 1519 12/19/18 0901     Procedures: None  CONSULTS:  None  Time spent: 45- minutes-Greater than 50% of this time was spent in counseling, explanation of diagnosis, planning of further management, and coordination of care.  The patient is critically ill with multiple organ system failure and requires high complexity decision making for assessment and support, frequent evaluation and titration of therapies, advanced monitoring, review of radiographic studies and interpretation of complex data.   MEDICATIONS: Scheduled Meds: . carvedilol  3.125 mg Oral BID WC  . chlordiazePOXIDE  25 mg Oral TID  . enoxaparin (LOVENOX) injection  40 mg Subcutaneous Q24H  . feeding supplement (ENSURE ENLIVE)  237 mL Oral BID BM  . folic acid  1 mg Oral Daily  . gabapentin  300 mg Oral BID  . levETIRAcetam  1,000 mg Oral Q12H  . multivitamin with minerals  1 tablet Oral Daily  . nicotine  21 mg Transdermal Daily  . pyridOXINE  100 mg Intravenous Daily  . QUEtiapine  50 mg Oral QHS  . thiamine injection  100 mg Intravenous Daily   Continuous Infusions: . 0.9 % NaCl with KCl 40 mEq / L 75 mL/hr (12/20/18 0151)  . magnesium sulfate 1 - 4 g bolus IVPB    . penicillin g continuous IV infusion 12 Million Units (12/21/18 0623)   PRN Meds:.acetaminophen **OR** acetaminophen, loperamide, LORazepam, LORazepam, metoprolol tartrate, ondansetron **OR** ondansetron (ZOFRAN) IV   PHYSICAL EXAM: Vital signs: Vitals:   12/20/18 2206 12/20/18 2355 12/20/18 2359 12/21/18 0400  BP:  (!) 145/90  (!) 162/121  Pulse:  (!) 112  (!) 128  Resp:      Temp: 98.5 F (36.9 C)  98.2 F (36.8 C) 99.8 F (37.7 C)  TempSrc: Oral  Axillary Axillary  SpO2:    92%  Weight:      Height:       Filed Weights   12/17/18 2300  Weight: 92.8 kg   Body mass index is 29.36 kg/m.   General appearance: Looks  acutely ill-tachypneic-accumulating secretions.  Very confused..  Eyes:no scleral icterus. HEENT: Atraumatic and Normocephalic Neck: supple Resp:Good air entry bilaterally, coarse rales up to mid lung bilaterally-some transmitted upper airway sounds. CVS: S1 S2 regular, tachycardic GI: Bowel sounds present, Non tender and not distended with no gaurding, rigidity or rebound. Extremities: B/L Lower Ext shows no edema, both legs are warm to touch Neurology: Sickle exam-but in four-point restraints and moving all 4 extremities.   Psychiatric: Unable to evaluate given mental status Musculoskeletal:No digital cyanosis Skin:No Rash, warm and dry Wounds:N/A  I have personally reviewed following labs and imaging studies  LABORATORY DATA: CBC: Recent Labs  Lab 12/17/18 1850 12/18/18 1321 12/19/18 0356 12/20/18 0332 12/21/18 0335  WBC 5.0 4.8 6.9 5.7 7.5  HGB 13.5 13.9 12.7* 11.6* 13.6  HCT 39.2 39.9 37.7* 32.4* 38.9*  MCV 100.5* 98.3 100.8* 99.7 101.6*  PLT PLATELET CLUMPS NOTED ON SMEAR, COUNT APPEARS DECREASED PLATELET CLUMPS NOTED ON SMEAR, COUNT APPEARS DECREASED 53* 60* 86*    Basic Metabolic Panel: Recent Labs  Lab 12/17/18 1850 12/18/18 1321 12/19/18 0356 12/20/18 0332 12/21/18 0335  NA 137 137 136 137 140  K 3.6 2.9* 3.2* 3.4* 4.3  CL 101 101 103 110 114*  CO2 19* 22 22 21* 20*  GLUCOSE 165* 106* 96 107* 93  BUN 6 8 10 19 18   CREATININE 0.75 0.95 0.99 1.53* 1.55*  CALCIUM 9.6 9.2 8.0* 8.0* 8.8*  MG  --   --  1.7  --  1.8    GFR: Estimated Creatinine Clearance: 76.8 mL/min (A) (by C-G formula based on SCr of 1.55 mg/dL (H)).  Liver Function Tests: Recent Labs  Lab 12/17/18 1850 12/18/18 1321 12/19/18 0356 12/20/18 0332 12/21/18 0335  AST 83* 138* 2,241* 549* 158*  ALT 51* 77* 750* 512* 285*  ALKPHOS 58 35* 23* 30* 45  BILITOT 1.3* 1.7* 1.6* 1.6* 2.2*  PROT 7.7 7.5 6.5 5.6* 6.1*  ALBUMIN 4.3 3.8 3.2* 2.7* 2.6*   No results for input(s): LIPASE, AMYLASE  in the last 168 hours. Recent Labs  Lab 12/19/18 1128 12/20/18 0332  AMMONIA 28 34    Coagulation Profile: Recent Labs  Lab 12/19/18 0702 12/20/18 0332  INR 1.5* 1.2    Cardiac Enzymes: No results for input(s): CKTOTAL, CKMB, CKMBINDEX, TROPONINI in the last 168 hours.  BNP (last 3 results) No results for input(s): PROBNP in the last 8760 hours.  HbA1C: No results for input(s): HGBA1C in the last 72 hours.  CBG: No results for input(s): GLUCAP in the last 168 hours.  Lipid Profile: No results for input(s): CHOL, HDL, LDLCALC, TRIG, CHOLHDL, LDLDIRECT in the last 72 hours.  Thyroid Function Tests: No results for input(s): TSH, T4TOTAL, FREET4, T3FREE, THYROIDAB in the last 72 hours.  Anemia Panel: No results for input(s): VITAMINB12, FOLATE, FERRITIN, TIBC, IRON, RETICCTPCT in the last 72 hours.  Urine analysis:    Component Value Date/Time   COLORURINE AMBER (A) 12/18/2018 2155   APPEARANCEUR HAZY (A) 12/18/2018 2155   LABSPEC 1.030 12/18/2018 2155   PHURINE 5.0 12/18/2018 2155   GLUCOSEU NEGATIVE 12/18/2018 2155   HGBUR SMALL (A) 12/18/2018 2155   BILIRUBINUR NEGATIVE 12/18/2018 2155   KETONESUR NEGATIVE 12/18/2018 2155   PROTEINUR 100 (A) 12/18/2018 2155   NITRITE NEGATIVE 12/18/2018 2155   LEUKOCYTESUR NEGATIVE 12/18/2018 2155    Sepsis Labs: Lactic Acid, Venous    Component Value Date/Time   LATICACIDVEN 1.9 12/18/2018 1912    MICROBIOLOGY: Recent Results (from the past 240 hour(s))  Respiratory Panel by PCR     Status: None   Collection Time: 12/18/18 12:18 PM  Result Value Ref Range Status   Adenovirus NOT DETECTED NOT DETECTED Final   Coronavirus 229E NOT DETECTED NOT DETECTED Final    Comment: (NOTE) The Coronavirus on the Respiratory Panel, DOES NOT test for the novel  Coronavirus (2019 nCoV)    Coronavirus HKU1 NOT DETECTED NOT DETECTED Final   Coronavirus NL63 NOT DETECTED NOT DETECTED Final   Coronavirus OC43 NOT DETECTED NOT  DETECTED Final   Metapneumovirus NOT DETECTED NOT DETECTED Final   Rhinovirus / Enterovirus NOT DETECTED NOT DETECTED Final   Influenza A NOT DETECTED NOT DETECTED Final   Influenza B NOT DETECTED NOT DETECTED Final   Parainfluenza Virus 1 NOT DETECTED NOT DETECTED Final   Parainfluenza Virus 2 NOT DETECTED NOT DETECTED Final   Parainfluenza Virus 3 NOT DETECTED NOT DETECTED Final   Parainfluenza Virus 4 NOT DETECTED NOT DETECTED Final   Respiratory Syncytial Virus NOT DETECTED NOT DETECTED Final   Bordetella pertussis NOT DETECTED NOT DETECTED Final   Chlamydophila pneumoniae NOT DETECTED NOT DETECTED Final   Mycoplasma pneumoniae NOT DETECTED NOT  DETECTED Final    Comment: Performed at [facility], [address], [place], [place] [address]  Culture, blood (routine x 2)     Status: Abnormal (Preliminary result)   Collection Time: 12/18/18  1:00 PM  Result Value Ref Range Status   Specimen Description BLOOD LEFT HAND  Final   Special Requests   Final    BOTTLES DRAWN AEROBIC ONLY Blood Culture adequate volume   Culture  Setup Time   Final    GRAM POSITIVE COCCI IN CHAINS AEROBIC BOTTLE ONLY CRITICAL RESULT CALLED TO, READ BACK BY AND VERIFIED WITH: J.LADFORD,PHARMD AT 0450 ON 12/19/18 BY G.MCADOO    Culture (A)  Final    GROUP A STREP (S.PYOGENES) ISOLATED SUSCEPTIBILITIES TO FOLLOW HEALTH DEPARTMENT NOTIFIED Performed at [facility], [address], [place], [place] [address]    Report Status PENDING  Incomplete  Blood Culture ID Panel (Reflexed)     Status: Abnormal   Collection Time: 12/18/18  1:00 PM  Result Value Ref Range Status   Enterococcus species NOT DETECTED NOT DETECTED Final   Listeria monocytogenes NOT DETECTED NOT DETECTED Final   Staphylococcus species NOT DETECTED NOT DETECTED Final   Staphylococcus aureus (BCID) NOT DETECTED NOT DETECTED Final   Streptococcus species DETECTED (A) NOT DETECTED Final    Comment: CRITICAL RESULT CALLED TO, READ  BACK BY AND VERIFIED WITH: J.LADFORD,PHARMD AT 0450 ON 12/19/18 BY G.MCADOO    Streptococcus agalactiae NOT DETECTED NOT DETECTED Final   Streptococcus pneumoniae NOT DETECTED NOT DETECTED Final   Streptococcus pyogenes DETECTED (A) NOT DETECTED Final    Comment: CRITICAL RESULT CALLED TO, READ BACK BY AND VERIFIED WITH: J.LADFORD,PHARMD AT 0450 ON 12/19/18 BY G.MCADOO    Acinetobacter baumannii NOT DETECTED NOT DETECTED Final   Enterobacteriaceae species NOT DETECTED NOT DETECTED Final   Enterobacter cloacae complex NOT DETECTED NOT DETECTED Final   Escherichia coli NOT DETECTED NOT DETECTED Final   Klebsiella oxytoca NOT DETECTED NOT DETECTED Final   Klebsiella pneumoniae NOT DETECTED NOT DETECTED Final   Proteus species NOT DETECTED NOT DETECTED Final   Serratia marcescens NOT DETECTED NOT DETECTED Final   Haemophilus influenzae NOT DETECTED NOT DETECTED Final   Neisseria meningitidis NOT DETECTED NOT DETECTED Final   Pseudomonas aeruginosa NOT DETECTED NOT DETECTED Final   Candida albicans NOT DETECTED NOT DETECTED Final   Candida glabrata NOT DETECTED NOT DETECTED Final   Candida krusei NOT DETECTED NOT DETECTED Final   Candida parapsilosis NOT DETECTED NOT DETECTED Final   Candida tropicalis NOT DETECTED NOT DETECTED Final    Comment: Performed at [facility], [address], [place], [place] [address]  Culture, blood (routine x 2)     Status: Abnormal (Preliminary result)   Collection Time: 12/18/18  1:20 PM  Result Value Ref Range Status   Specimen Description BLOOD RIGHT HAND  Final   Special Requests   Final    BOTTLES DRAWN AEROBIC ONLY Blood Culture results may not be optimal due to an inadequate volume of blood received in culture bottles   Culture  Setup Time   Final    GRAM POSITIVE COCCI IN CHAINS AEROBIC BOTTLE ONLY CRITICAL VALUE NOTED.  VALUE IS CONSISTENT WITH PREVIOUSLY REPORTED AND CALLED VALUE.    Culture (A)  Final    GROUP A STREP (S.PYOGENES)  ISOLATED HEALTH DEPARTMENT NOTIFIED Performed at [facility], [address] [place], [place] [address]    Report Status PENDING  Incomplete  Gastrointestinal Panel by PCR , Stool     Status: None   Collection Time: 12/18/18  3:19 PM  Result Value Ref Range Status   Campylobacter species NOT DETECTED NOT DETECTED Final   Plesimonas shigelloides NOT DETECTED NOT DETECTED Final   Salmonella species NOT DETECTED NOT DETECTED Final   Yersinia enterocolitica NOT DETECTED NOT DETECTED Final   Vibrio species NOT DETECTED NOT DETECTED Final   Vibrio cholerae NOT DETECTED NOT DETECTED Final   Enteroaggregative E coli (EAEC) NOT DETECTED NOT DETECTED Final   Enteropathogenic E coli (EPEC) NOT DETECTED NOT DETECTED Final   Enterotoxigenic E coli (ETEC) NOT DETECTED NOT DETECTED Final   Shiga like toxin producing E coli (STEC) NOT DETECTED NOT DETECTED Final   Shigella/Enteroinvasive E coli (EIEC) NOT DETECTED NOT DETECTED Final   Cryptosporidium NOT DETECTED NOT DETECTED Final   Cyclospora cayetanensis NOT DETECTED NOT DETECTED Final   Entamoeba histolytica NOT DETECTED NOT DETECTED Final   Giardia lamblia NOT DETECTED NOT DETECTED Final   Adenovirus F40/41 NOT DETECTED NOT DETECTED Final   Astrovirus NOT DETECTED NOT DETECTED Final   Norovirus GI/GII NOT DETECTED NOT DETECTED Final   Rotavirus A NOT DETECTED NOT DETECTED Final   Sapovirus (I, II, IV, and V) NOT DETECTED NOT DETECTED Final    Comment: Performed at [facility], [address] [place], [place] [address]  Culture, blood (routine x 2)     Status: None (Preliminary result)   Collection Time: 12/19/18  9:13 AM  Result Value Ref Range Status   Specimen Description BLOOD RIGHT ANTECUBITAL  Final   Special Requests AEROBIC BOTTLE ONLY Blood Culture adequate volume  Final   Culture   Final    NO GROWTH 2 DAYS Performed at [facility], [address], [place], [place] [address]    Report Status PENDING   Incomplete  Culture, blood (routine x 2)     Status: None (Preliminary result)   Collection Time: 12/19/18  9:26 AM  Result Value Ref Range Status   Specimen Description BLOOD RIGHT THUMB  Final   Special Requests AEROBIC BOTTLE ONLY Blood Culture adequate volume  Final   Culture   Final    NO GROWTH 2 DAYS Performed at [facility], [address], [place], [place] [address]    Report Status PENDING  Incomplete    RADIOLOGY STUDIES/RESULTS: Dg Chest 2 View  Result Date: 12/17/2018 CLINICAL DATA:  Seizure, fever EXAM: CHEST - 2 VIEW COMPARISON:  09/09/2018 chest radiograph. FINDINGS: Stable cardiomediastinal silhouette with normal heart size. No pneumothorax. No pleural effusion. Lungs appear clear, with no acute consolidative airspace disease and no pulmonary edema. IMPRESSION: No active cardiopulmonary disease. Electronically Signed   By: Delbert Phenix M.D.   On: 12/17/2018 20:47   Dg Shoulder Right  Result Date: 12/20/2018 CLINICAL DATA:  Right shoulder pain. EXAM: RIGHT SHOULDER - 2+ VIEW COMPARISON:  None. FINDINGS: There is no evidence of fracture or dislocation. There is no evidence of arthropathy or other focal bone abnormality. Soft tissues are unremarkable. IMPRESSION: Negative. Electronically Signed   By: Ted Mcalpine M.D.   On: 12/20/2018 15:59   Ct Head Wo Contrast  Result Date: 12/17/2018 CLINICAL DATA:  Seizure, head trauma EXAM: CT HEAD WITHOUT CONTRAST TECHNIQUE: Contiguous axial images were obtained from the base of the skull through the vertex without intravenous contrast. COMPARISON:  MRI 09/18/2018 FINDINGS: Brain: No acute intracranial abnormality. Specifically, no hemorrhage, hydrocephalus, mass lesion,  acute infarction, or significant intracranial injury. Vascular: No hyperdense vessel or unexpected calcification. Skull: No acute calvarial abnormality. Sinuses/Orbits: Visualized paranasal sinuses and mastoids clear. Orbital soft tissues unremarkable. Other:  None IMPRESSION: No acute intracranial abnormality. Electronically Signed   By: Charlett Nose M.D.   On: 12/17/2018 20:08   US Renal  Result Date: 12/21/2018 CLINICAL DATA:  Acute kidney injury EXAM: RENAL / URINARY TRACT ULTRASOUND COMPLETE COMPARISON:  None. FINDINGS: Right Kidney: Renal measurements: 13.8 x 5.8 x 6.3 cm = volume: 268.4 mL . Echogenicity within normal limits. No mass or hydronephrosis visualized. Trace right perinephric fluid. Left Kidney: Renal measurements: 14.3 x 6.4 x 7.6 cm = volume: 371.8 mL. Echogenicity within normal limits. No mass or hydronephrosis visualized. Bladder: Appears normal for degree of bladder distention. IMPRESSION: Negative renal ultrasound Electronically Signed   By: Jasmine Pang M.D.   On: 12/21/2018 01:17   Dg Chest Port 1v Same Day  Result Date: 12/18/2018 CLINICAL DATA:  Shortness of breath EXAM: PORTABLE CHEST 1 VIEW COMPARISON:  December 17, 2018 FINDINGS: The heart size and mediastinal contours are within normal limits. Both lungs are clear. The visualized skeletal structures are unremarkable. IMPRESSION: No active disease. Electronically Signed   By: Gerome Sam III M.D   On: 12/18/2018 16:01   US Abdomen Limited Ruq  Result Date: 12/19/2018 CLINICAL DATA:  Elevated LFTs EXAM: ULTRASOUND ABDOMEN LIMITED RIGHT UPPER QUADRANT COMPARISON:  None. FINDINGS: Gallbladder: Evaluation of the gallbladder is limited. There appears to be a thickened wall measuring up to 5.5 mm. No stones, pericholecystic fluid, or Murphy's sign identified. Common bile duct: Diameter: 4.0 mm Liver: No focal lesion identified. Within normal limits in parenchymal echogenicity. Portal vein is patent on color Doppler imaging with normal direction of blood flow towards the liver. IMPRESSION: 1. The study is limited due to patient condition and difficulty following instructions. The gallbladder is not well assessed as it is contracted. The wall thickening to 5.5 mm could be due to lack of  distention. No stones, pericholecystic fluid, or Murphy's sign identified. If there is concern for acute cholecystitis, recommend a HIDA scan. 2. No other abnormalities. Electronically Signed   By: Gerome Sam III M.D   On: 12/19/2018 13:54     LOS: 4 days   Jeoffrey Massed, MD  [facility]  If 7PM-7AM, please contact night-coverage  Please page via [email or web address]  Go to [email or web address] and use [place]'s universal password to access. If you do not have the password, please contact the hospital operator.  Locate the [facility] provider you are looking for under [facility] and page to a number that you can be directly reached. If you still have difficulty reaching the provider, please page the [facility] (Director on Call) for the Hospitalists listed on amion for assistance.  12/21/2018, 7:31 AM

## 2018-12-21 NOTE — Progress Notes (Signed)
Tessie Fass NP with PCCM to bedside.  Cleared for transfer to ICU.  Report given by Monica Becton RN - assisted with transfer to [phone] on monitor - handoff to [facility].  Parents to waiting room - updated per MD staff.

## 2018-12-22 ENCOUNTER — Inpatient Hospital Stay (HOSPITAL_COMMUNITY): Payer: BLUE CROSS/BLUE SHIELD

## 2018-12-22 DIAGNOSIS — J810 Acute pulmonary edema: Secondary | ICD-10-CM

## 2018-12-22 LAB — BASIC METABOLIC PANEL
Anion gap: 5 (ref 5–15)
BUN: 26 mg/dL — ABNORMAL HIGH (ref 6–20)
CO2: 21 mmol/L — ABNORMAL LOW (ref 22–32)
CREATININE: 1.74 mg/dL — AB (ref 0.61–1.24)
Calcium: 8.3 mg/dL — ABNORMAL LOW (ref 8.9–10.3)
Chloride: 113 mmol/L — ABNORMAL HIGH (ref 98–111)
GFR calc Af Amer: 58 mL/min — ABNORMAL LOW (ref >60)
GFR calc non Af Amer: 50 mL/min — ABNORMAL LOW (ref >60)
Glucose, Bld: 109 mg/dL — ABNORMAL HIGH (ref 70–99)
Potassium: 4.4 mmol/L (ref 3.5–5.1)
Sodium: 139 mmol/L (ref 135–145)

## 2018-12-22 LAB — CBC
HCT: 35.2 % — ABNORMAL LOW (ref 39.0–52.0)
Hemoglobin: 11.4 g/dL — ABNORMAL LOW (ref 13.0–17.0)
MCH: 34 pg (ref 26.0–34.0)
MCHC: 32.4 g/dL (ref 30.0–36.0)
MCV: 105.1 fL — ABNORMAL HIGH (ref 80.0–100.0)
Platelets: 113 10*3/uL — ABNORMAL LOW (ref 150–400)
RBC: 3.35 MIL/uL — ABNORMAL LOW (ref 4.22–5.81)
RDW: 14 % (ref 11.5–15.5)
WBC: 6 10*3/uL (ref 4.0–10.5)
nRBC: 0 % (ref 0.0–0.2)

## 2018-12-22 LAB — GLUCOSE, CAPILLARY
Glucose-Capillary: 103 mg/dL — ABNORMAL HIGH (ref 70–99)
Glucose-Capillary: 109 mg/dL — ABNORMAL HIGH (ref 70–99)
Glucose-Capillary: 99 mg/dL (ref 70–99)
Glucose-Capillary: 115 mg/dL — ABNORMAL HIGH (ref 70–99)
Glucose-Capillary: 117 mg/dL — ABNORMAL HIGH (ref 70–99)

## 2018-12-22 LAB — PHOSPHORUS
Phosphorus: 1 mg/dL — CL (ref 2.5–4.6)
Phosphorus: 2.2 mg/dL — ABNORMAL LOW (ref 2.5–4.6)

## 2018-12-22 LAB — MAGNESIUM
MAGNESIUM: 1.8 mg/dL (ref 1.7–2.4)
Magnesium: 2.1 mg/dL (ref 1.7–2.4)

## 2018-12-22 MED ORDER — SODIUM PHOSPHATES 45 MMOLE/15ML IV SOLN
30.0000 mmol | Freq: Once | INTRAVENOUS | Status: AC
  Administered 2018-12-22: 30 mmol via INTRAVENOUS
  Filled 2018-12-22: qty 10

## 2018-12-22 MED ORDER — PRO-STAT SUGAR FREE PO LIQD: 30.0000 mL | Freq: Two times a day (BID) | ORAL | Status: DC

## 2018-12-22 MED ORDER — VITAL AF 1.2 CAL PO LIQD
1000.0000 mL | ORAL | Status: DC
  Administered 2018-12-22 – 2018-12-23 (×2): 1000 mL

## 2018-12-22 MED ORDER — MIDAZOLAM HCL 2 MG/2ML IJ SOLN
1.0000 mg | INTRAMUSCULAR | Status: DC | PRN
  Administered 2018-12-22 – 2018-12-23 (×3): 2 mg via INTRAVENOUS
  Filled 2018-12-22 (×4): qty 2

## 2018-12-22 NOTE — Progress Notes (Signed)
eLink Physician-Brief Progress Note Patient Name: Darren Diaz DOB: 06/11/1986 MRN: [id number]   Date of Service  12/22/2018  HPI/Events of Note  Hypophos  eICU Interventions  Phos replaced     Intervention Category Intermediate Interventions: Electrolyte abnormality - evaluation and management  DETERDING,ELIZABETH 12/22/2018, 6:33 AM

## 2018-12-22 NOTE — Progress Notes (Signed)
CRITICAL VALUE ALERT  Critical Value:  Phos 1.0  Date & Time Notied:  12/22/2018 @ 0630  Provider Notified: Pola Corn MD  Orders Received/Actions taken: MD notified; new orders

## 2018-12-22 NOTE — Progress Notes (Signed)
[facility]   Reason for visit: Follow up on bacteremia  Interval History: Remains intubated in the ICU.  Repeat blood cultures ngtd; afebrile, WBC wnl. History unobtainable from the patient   Physical Exam: Constitutional:  Vitals:   12/22/18 0700 12/22/18 0845  BP: 122/72 132/86  Pulse: 78 82  Resp: 18 16  Temp:    SpO2: 96% 96%   patient appears in NAD Eyes: anicteric HENT: +ET Respiratory: respiratory effort on vent; CTA B, anterior exam Cardiovascular: tachy RR GI: soft, nt, nd Skin: improved cellulitis  Review of Systems: Unable to be assessed due to patient factors  Lab Results  Component Value Date   WBC 6.0 12/22/2018   HGB 11.4 (L) 12/22/2018   HCT 35.2 (L) 12/22/2018   MCV 105.1 (H) 12/22/2018   PLT 113 (L) 12/22/2018    Lab Results  Component Value Date   CREATININE 1.74 (H) 12/22/2018   BUN 26 (H) 12/22/2018   NA 139 12/22/2018   K 4.4 12/22/2018   CL 113 (H) 12/22/2018   CO2 21 (L) 12/22/2018    Lab Results  Component Value Date   ALT 285 (H) 12/21/2018   AST 158 (H) 12/21/2018   ALKPHOS 45 12/21/2018     Microbiology: Recent Results (from the past 240 hour(s))  Respiratory Panel by PCR     Status: None   Collection Time: 12/18/18 12:18 PM  Result Value Ref Range Status   Adenovirus NOT DETECTED NOT DETECTED Final   Coronavirus 229E NOT DETECTED NOT DETECTED Final    Comment: (NOTE) The Coronavirus on the Respiratory Panel, DOES NOT test for the novel  Coronavirus (2019 nCoV)    Coronavirus HKU1 NOT DETECTED NOT DETECTED Final   Coronavirus NL63 NOT DETECTED NOT DETECTED Final   Coronavirus OC43 NOT DETECTED NOT DETECTED Final   Metapneumovirus NOT DETECTED NOT DETECTED Final   Rhinovirus / Enterovirus NOT DETECTED NOT DETECTED Final   Influenza A NOT DETECTED NOT DETECTED Final   Influenza B NOT DETECTED NOT DETECTED Final   Parainfluenza Virus 1 NOT DETECTED NOT DETECTED Final   Parainfluenza Virus 2  NOT DETECTED NOT DETECTED Final   Parainfluenza Virus 3 NOT DETECTED NOT DETECTED Final   Parainfluenza Virus 4 NOT DETECTED NOT DETECTED Final   Respiratory Syncytial Virus NOT DETECTED NOT DETECTED Final   Bordetella pertussis NOT DETECTED NOT DETECTED Final   Chlamydophila pneumoniae NOT DETECTED NOT DETECTED Final   Mycoplasma pneumoniae NOT DETECTED NOT DETECTED Final    Comment: Performed at [facility], [address], [place], [place] [address]  Culture, blood (routine x 2)     Status: Abnormal   Collection Time: 12/18/18  1:00 PM  Result Value Ref Range Status   Specimen Description BLOOD LEFT HAND  Final   Special Requests   Final    BOTTLES DRAWN AEROBIC ONLY Blood Culture adequate volume   Culture  Setup Time   Final    GRAM POSITIVE COCCI IN CHAINS AEROBIC BOTTLE ONLY CRITICAL RESULT CALLED TO, READ BACK BY AND VERIFIED WITH: J.LADFORD,PHARMD AT 0450 ON 12/19/18 BY G.MCADOO    Culture (A)  Final    GROUP A STREP (S.PYOGENES) ISOLATED HEALTH DEPARTMENT NOTIFIED Performed at [facility], [address], [place], [place] [address]    Report Status 12/21/2018 FINAL  Final   Organism ID, Bacteria GROUP A STREP (S.PYOGENES) ISOLATED  Final      Susceptibility   Group  a strep (s.pyogenes) isolated - MIC*    PENICILLIN <=0.06 SENSITIVE Sensitive     CEFTRIAXONE <=0.12 SENSITIVE Sensitive     ERYTHROMYCIN 2 RESISTANT Resistant     LEVOFLOXACIN <=0.25 SENSITIVE Sensitive     VANCOMYCIN <=0.12 SENSITIVE Sensitive     * GROUP A STREP (S.PYOGENES) ISOLATED  Blood Culture ID Panel (Reflexed)     Status: Abnormal   Collection Time: 12/18/18  1:00 PM  Result Value Ref Range Status   Enterococcus species NOT DETECTED NOT DETECTED Final   Listeria monocytogenes NOT DETECTED NOT DETECTED Final   Staphylococcus species NOT DETECTED NOT DETECTED Final   Staphylococcus aureus (BCID) NOT DETECTED NOT DETECTED Final   Streptococcus species DETECTED (A) NOT DETECTED Final     Comment: CRITICAL RESULT CALLED TO, READ BACK BY AND VERIFIED WITH: J.LADFORD,PHARMD AT 0450 ON 12/19/18 BY G.MCADOO    Streptococcus agalactiae NOT DETECTED NOT DETECTED Final   Streptococcus pneumoniae NOT DETECTED NOT DETECTED Final   Streptococcus pyogenes DETECTED (A) NOT DETECTED Final    Comment: CRITICAL RESULT CALLED TO, READ BACK BY AND VERIFIED WITH: J.LADFORD,PHARMD AT 0450 ON 12/19/18 BY G.MCADOO    Acinetobacter baumannii NOT DETECTED NOT DETECTED Final   Enterobacteriaceae species NOT DETECTED NOT DETECTED Final   Enterobacter cloacae complex NOT DETECTED NOT DETECTED Final   Escherichia coli NOT DETECTED NOT DETECTED Final   Klebsiella oxytoca NOT DETECTED NOT DETECTED Final   Klebsiella pneumoniae NOT DETECTED NOT DETECTED Final   Proteus species NOT DETECTED NOT DETECTED Final   Serratia marcescens NOT DETECTED NOT DETECTED Final   Haemophilus influenzae NOT DETECTED NOT DETECTED Final   Neisseria meningitidis NOT DETECTED NOT DETECTED Final   Pseudomonas aeruginosa NOT DETECTED NOT DETECTED Final   Candida albicans NOT DETECTED NOT DETECTED Final   Candida glabrata NOT DETECTED NOT DETECTED Final   Candida krusei NOT DETECTED NOT DETECTED Final   Candida parapsilosis NOT DETECTED NOT DETECTED Final   Candida tropicalis NOT DETECTED NOT DETECTED Final    Comment: Performed at [facility], [address], [place], [place] [address]  Culture, blood (routine x 2)     Status: Abnormal   Collection Time: 12/18/18  1:20 PM  Result Value Ref Range Status   Specimen Description BLOOD RIGHT HAND  Final   Special Requests   Final    BOTTLES DRAWN AEROBIC ONLY Blood Culture results may not be optimal due to an inadequate volume of blood received in culture bottles   Culture  Setup Time   Final    GRAM POSITIVE COCCI IN CHAINS AEROBIC BOTTLE ONLY CRITICAL VALUE NOTED.  VALUE IS CONSISTENT WITH PREVIOUSLY REPORTED AND CALLED VALUE.    Culture (A)  Final    GROUP A STREP  (S.PYOGENES) ISOLATED SUSCEPTIBILITIES PERFORMED ON PREVIOUS CULTURE WITHIN THE LAST 5 DAYS. HEALTH DEPARTMENT NOTIFIED Performed at [facility], [address] [place], [place] [address]    Report Status 12/21/2018 FINAL  Final  Gastrointestinal Panel by PCR , Stool     Status: None   Collection Time: 12/18/18  3:19 PM  Result Value Ref Range Status   Campylobacter species NOT DETECTED NOT DETECTED Final   Plesimonas shigelloides NOT DETECTED NOT DETECTED Final   Salmonella species NOT DETECTED NOT DETECTED Final   Yersinia enterocolitica NOT DETECTED NOT DETECTED Final   Vibrio species NOT DETECTED NOT DETECTED Final   Vibrio cholerae NOT DETECTED NOT DETECTED Final   Enteroaggregative E coli (EAEC) NOT  DETECTED NOT DETECTED Final   Enteropathogenic E coli (EPEC) NOT DETECTED NOT DETECTED Final   Enterotoxigenic E coli (ETEC) NOT DETECTED NOT DETECTED Final   Shiga like toxin producing E coli (STEC) NOT DETECTED NOT DETECTED Final   Shigella/Enteroinvasive E coli (EIEC) NOT DETECTED NOT DETECTED Final   Cryptosporidium NOT DETECTED NOT DETECTED Final   Cyclospora cayetanensis NOT DETECTED NOT DETECTED Final   Entamoeba histolytica NOT DETECTED NOT DETECTED Final   Giardia lamblia NOT DETECTED NOT DETECTED Final   Adenovirus F40/41 NOT DETECTED NOT DETECTED Final   Astrovirus NOT DETECTED NOT DETECTED Final   Norovirus GI/GII NOT DETECTED NOT DETECTED Final   Rotavirus A NOT DETECTED NOT DETECTED Final   Sapovirus (I, II, IV, and V) NOT DETECTED NOT DETECTED Final    Comment: Performed at [facility], [address] [place], [place] [address]  Culture, blood (routine x 2)     Status: None (Preliminary result)   Collection Time: 12/19/18  9:13 AM  Result Value Ref Range Status   Specimen Description BLOOD RIGHT ANTECUBITAL  Final   Special Requests AEROBIC BOTTLE ONLY Blood Culture adequate volume  Final   Culture   Final    NO GROWTH 3 DAYS Performed at [facility], [address] [place], [place] [address]    Report Status PENDING  Incomplete  Culture, blood (routine x 2)     Status: None (Preliminary result)   Collection Time: 12/19/18  9:26 AM  Result Value Ref Range Status   Specimen Description BLOOD RIGHT THUMB  Final   Special Requests AEROBIC BOTTLE ONLY Blood Culture adequate volume  Final   Culture   Final    NO GROWTH 3 DAYS Performed at [facility], [address], [place], [place] [address]    Report Status PENDING  Incomplete  MRSA PCR Screening     Status: None   Collection Time: 12/21/18  8:38 AM  Result Value Ref Range Status   MRSA by PCR NEGATIVE NEGATIVE Final    Comment:        The GeneXpert MRSA Assay (FDA approved for NASAL specimens only), is one component of a comprehensive MRSA colonization surveillance program. It is not intended to diagnose MRSA infection nor to guide or monitor treatment for MRSA infections. Performed at [facility], [address], [place], [place] [address]     Impression/Plan:  1. Group A Strep bacteremia - clearing.  On penicillin and will continue.    2.  Alcohol withdrawal - Remains on precedex  3.  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

## 2018-12-22 NOTE — Progress Notes (Addendum)
Initial Nutrition Assessment  DOCUMENTATION CODES:   Not applicable  INTERVENTION:    Vital AF 1.2 at 75 ml/h (1800 ml per day)   Provides 2160 kcal, 135 gm protein, 1460 ml free water daily   Continue to monitor and replete electrolytes as needed  NUTRITION DIAGNOSIS:   Inadequate oral intake related to inability to eat as evidenced by NPO status.  GOAL:   Patient will meet greater than or equal to 90% of their needs  MONITOR:   Vent status, TF tolerance, Labs, Skin, I & O's  REASON FOR ASSESSMENT:   Ventilator, Consult Enteral/tube feeding initiation and management  ASSESSMENT:   33 yo male with PMH of seizures, alcohol abuse, tobacco abuse, PDA repair at age 27 who was admitted with group A strep bacteremia, alcohol withdrawal, and cardiomyopathy. Required ICU transfer and intubation on 3/11.   Received MD Consult for TF initiation and management. OGT in place.   Patient is currently intubated on ventilator support MV: 10.1 L/min Temp (24hrs), Avg:99.2 F (37.3 C), Min:98.8 F (37.1 C), Max:99.6 F (37.6 C)   Labs reviewed. Phosphorus 1 (L) CBG's: 103-109 Medications reviewed and include folic acid, liquid MVI, vitamin B-6, thiamine, sodium phosphate.   Parents in room with patient report that patient usually eats well and has a good appetite. He drinks a Boost shake every day. No weight loss noted. Patient is at nutrition risk, given alcohol abuse.   Suspect current weight is elevated due to volume overload. Unsure of dry/usual weight.   NUTRITION - FOCUSED PHYSICAL EXAM:    Most Recent Value  Orbital Region  No depletion  Upper Arm Region  No depletion  Thoracic and Lumbar Region  No depletion  Buccal Region  No depletion  Temple Region  No depletion  Clavicle Bone Region  No depletion  Clavicle and Acromion Bone Region  No depletion  Scapular Bone Region  Unable to assess  Dorsal Hand  No depletion  Patellar Region  No depletion  Anterior  Thigh Region  No depletion  Posterior Calf Region  No depletion  Edema (RD Assessment)  Mild  Hair  Reviewed  Eyes  Unable to assess  Mouth  Unable to assess  Skin  Reviewed  Nails  Reviewed       Diet Order:   Diet Order            Diet NPO time specified  Diet effective now              EDUCATION NEEDS:   No education needs have been identified at this time  Skin:  Skin Assessment: Skin Integrity Issues: Skin Integrity Issues:: Stage II, Other (Comment) Stage II: L heel Other: MASD to coccyx, scrotum  Last BM:  3/11 (type 6, green, large)  Height:   Ht Readings from Last 1 Encounters:  12/21/18 5\' 10"  (1.778 m)    Weight:   Wt Readings from Last 1 Encounters:  12/17/18 92.8 kg    Ideal Body Weight:  75.5 kg  BMI:  Body mass index is 29.36 kg/m.  Estimated Nutritional Needs:   Kcal:  2150  Protein:  125-145 gm  Fluid:  2.2 L    Joaquin Courts, RD, LDN, CNSC Pager [phone] After Hours Pager [phone]

## 2018-12-22 NOTE — Consult Note (Signed)
NAME:  Darren Diaz, MRN:  [id number], DOB:  05/06/1986, LOS: 5 ADMISSION DATE:  12/17/2018, CONSULTATION DATE:  12/21/18 REFERRING MD:  Jerral Ralph - [facility], CHIEF COMPLAINT:  Alcohol withdrawal, sepsis   Brief History   33 yo M admitted 3/7 after fall related to EtOH abuse. Over the weekend, patient became septic, growing group A strep. Patient worsening EtH withdrawal, increased TMax.  On morning of 3/11 patient acute respiratory decline, on 100% non-rebreather. PCCM consulted.   History of present illness   HPI per chart review, patient encephalopathic at time of consultation. 33 year old M with PMH EtOH abuse, seizure, who presented 12/17/18 after a friend heard the patient fall, and discovered him be seizing. The seizure lasted several minutes and the patient exhibited a post-ictal period. The patient had a recent history of nausea and vomiting several times prior to seizure. The patient denied abdominal pain, diarrhea. Last EtOH intake 3/7.   Past Medical History    Significant Hospital Events   3/7 admitted s/p seizure 3/9 Group A strep RLE cellulitis  3/11 transferred to ICU for worsening EtOH withdrawal, acute respiratory failure, sepsis   Consults:  Cardiology ID  PCCM  Procedures:  3/11 intubated >>>   Significant Diagnostic Tests:  3/9 ECHO> LVEF 35%, diffuse hypokinesis  3/8 RUQ US> poor visualization of gallbladder, appearance of thickened wall 5.22mm.  3/10 Renal US> no hydronephrosis, normal appearing bladder. No focal liver abnormalities  3/11 CXR> ETT placement, no pneumothorax   Micro Data:  3/8> group A strep (strep pyogenes) BCx  3/8 GI panel> negative 3/11 MRSA>>>   Antimicrobials:  PCN 3/9>>>  Interim history/subjective:  No events overnight, sedate this AM  Objective   Blood pressure (!) 135/92, pulse 77, temperature 99 F (37.2 C), temperature source Oral, resp. rate 18, height 5\' 10"  (1.778 m), weight 92.8 kg, SpO2 100 %.    Vent Mode: PRVC FiO2 (%):   [40 %] 40 % Set Rate:  [18 bmp] 18 bmp Vt Set:  [580 mL] 580 mL PEEP:  [5 cmH20] 5 cmH20 Plateau Pressure:  [12 cmH20-18 cmH20] 16 cmH20   Intake/Output Summary (Last 24 hours) at 12/22/2018 1354 Last data filed at 12/22/2018 1352 Gross per 24 hour  Intake 2076.42 ml  Output 2155 ml  Net -78.58 ml   Filed Weights   12/17/18 2300  Weight: 92.8 kg    Examination: General: Sedate, acutely ill appearing  HENT: Hominy/AT, PERRL, EOM-I and MMM Lungs: Coarse BS diffusely Cardiovascular: RRR, Nl S1/S2 and -M/R/G Abdomen: Soft, NT, ND and +BS Extremities: Moves BUE BLE spontaneously, RLL erythematous over shin consistent with known cellulitis. BUE BLE 1+ edema  Neuro: Sedate but withdraws all ext to command GU: Foley collecting yellow clear urine   Resolved Hospital Problem list     Assessment & Plan:   Acute Respiratory Failure with hypoxia - In setting of EtOH withdrawal, sepsis - Pulmonary edema appreciated on CXR, given lasix by hospitalist prior to transfer  - Intubated 3/11 - Pulmonary  P - Full vent support - PS trials in AM - ABG in 1 hour - CXR in AM - Strict I/O - Sedation: fentanyl gtt, precedex gtt, PRN ativan  - VAP prevention, pulmonary hygiene   Acute Encephalopathy  - Likely multifactorial secondary to sepsis, EtOH withdrawal, CNS depressing medications, history of seizure  - CTH negative for acute abnormalities - EEG no seizure activity  P - Continue abx for sepsis as below - Precedex, ativan, librium for EtOH  withdrawal as below - Continue seroquel  - RASS goal 0 to -1  Alcohol Abuse Disorder with withdrawal P - ICU CIWA protocol  - Precedex now that in ICU - Precedex gtt, PRN ativan, librium  - Multivitamin, thiamine, folate  - Seizure precautions   Sepsis due to streptococcus pyogenes bacteremia - RLL cellulitis  - ECHO negative for endocarditis  - Associated tachycardia  P - ID following appreciate recommendations - Continue PCN  - Trend  WBC, fever, UOP  - Metop PRN for tachycardia   Seizure disorder - Non-compliant with outpatient Keppra - Witnessed seizure 3/7 prior to ED presentation P - Continue IV Keppra   Systolic heart failure - Newly diagnosed - ECHO EF 35% P - Cardiology consulted - BNP pending - S/p  Lasix 3/11 in setting of acute pulmonary edema, hold off for now - Strict I/O   Acute Kidney Injury - Likely in setting of recent hemodynamic instability - Renal US unremarkable  P - Strict I/O - Monitor and trend BMP - Minimize nephrotoxic agents   Acute Liver injury with transaminitis  - Elevated LFTs down-trending - Likely in setting of sepsis/shock liver - RUQ US unremarkable for hepatic findings  P - Given that LFTs are improving and RUQ [place] neg, feel that it is ok to monitor and trend LFTs PRN   Nutrition and Electrolyte abnormalities - ?chronic malnutrition in setting of EtOH abuse - Hypokalemia, hypomagnesemia  P - Continue micronutrient supplementation as above - Monitor electrolytes, replete PRN   Tobacco use disorder P - Continue nicotine patch  No family bedside 3/12  Best practice:  Diet: NPO  Pain/Anxiety/Delirium protocol (if indicated): Precedex, Fentanyl, Ativan  VAP protocol (if indicated): yes DVT prophylaxis: lovenox GI prophylaxis: protonix Glucose control: monitor  Mobility: bedrest  Code Status: Full  Family Communication: husband and wife updated at bedside  Disposition: Continue ICU level of care   Labs   CBC: Recent Labs  Lab 12/18/18 1321 12/19/18 0356 12/20/18 0332 12/21/18 0335 12/21/18 0929 12/22/18 0520  WBC 4.8 6.9 5.7 7.5  --  6.0  HGB 13.9 12.7* 11.6* 13.6 11.9* 11.4*  HCT 39.9 37.7* 32.4* 38.9* 35.0* 35.2*  MCV 98.3 100.8* 99.7 101.6*  --  105.1*  PLT PLATELET CLUMPS NOTED ON SMEAR, COUNT APPEARS DECREASED 53* 60* 86*  --  113*    Basic Metabolic Panel: Recent Labs  Lab 12/18/18 1321 12/19/18 0356 12/20/18 0332 12/21/18 0335  12/21/18 0929 12/22/18 0520  NA 137 136 137 140 139 139  K 2.9* 3.2* 3.4* 4.3 4.6 4.4  CL 101 103 110 114*  --  113*  CO2 22 22 21* 20*  --  21*  GLUCOSE 106* 96 107* 93  --  109*  BUN --  26*  CREATININE 0.95 0.99 1.53* 1.55*  --  1.74*  CALCIUM 9.2 8.0* 8.0* 8.8*  --  8.3*  MG  --  1.7  --  1.8  --  2.1  PHOS  --   --   --   --   --  1.0*   GFR: Estimated Creatinine Clearance: 68.4 mL/min (A) (by C-G formula based on SCr of 1.74 mg/dL (H)). Recent Labs  Lab 12/18/18 1526 12/18/18 1912 12/19/18 0356 12/20/18 0332 12/21/18 0335 12/21/18 0738 12/22/18 0520  WBC  --   --  6.9 5.7 7.5  --  6.0  LATICACIDVEN 2.2* 1.9  --   --   --  0.8  --  Liver Function Tests: Recent Labs  Lab 12/17/18 1850 12/18/18 1321 12/19/18 0356 12/20/18 0332 12/21/18 0335  AST 83* 138* 2,241* 549* 158*  ALT 51* 77* 750* 512* 285*  ALKPHOS 58 35* 23* 30* 45  BILITOT 1.3* 1.7* 1.6* 1.6* 2.2*  PROT 7.7 7.5 6.5 5.6* 6.1*  ALBUMIN 4.3 3.8 3.2* 2.7* 2.6*   No results for input(s): LIPASE, AMYLASE in the last 168 hours. Recent Labs  Lab 12/19/18 1128 12/20/18 0332  AMMONIA 28 34    ABG    Component Value Date/Time   PHART 7.321 (L) 12/21/2018 0929   PCO2ART 36.1 12/21/2018 0929   PO2ART 102.0 12/21/2018 0929   HCO3 18.7 (L) 12/21/2018 0929   TCO2 20 (L) 12/21/2018 0929   ACIDBASEDEF 7.0 (H) 12/21/2018 0929   O2SAT 97.0 12/21/2018 0929     Coagulation Profile: Recent Labs  Lab 12/19/18 0702 12/20/18 0332  INR 1.5* 1.2    Cardiac Enzymes: No results for input(s): CKTOTAL, CKMB, CKMBINDEX, TROPONINI in the last 168 hours.  HbA1C: Hgb A1c MFr Bld  Date/Time Value Ref Range Status  09/14/2018 04:28 AM 4.1 (L) 4.8 - 5.6 % Final    Comment:    (NOTE) Pre diabetes:          5.7%-6.4% Diabetes:              >6.4% Glycemic control for   <7.0% adults with diabetes     CBG: Recent Labs  Lab 12/21/18 1145 12/21/18 1455 12/22/18 0805 12/22/18 1139  GLUCAP  114* 107* 103* 109*    Review of Systems:   Unable to obtain due to acute encephalopathy   Past Medical History  He,  has a past medical history of Alcohol abuse, S/P PDA repair (age 1), Seizures (HCC) (08/2018), and Tobacco abuse.   Surgical History    Past Surgical History:  Procedure Laterality Date  . CHL IP CC INTUBATION ORDABLE  12/21/2018      . 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 He has never used smokeless tobacco. He reports current alcohol use of about 6.0 standard drinks of alcohol per week. He reports that he does not use drugs.   Family History   His family history includes Fibromyalgia in his mother.   Allergies No Known Allergies   Home Medications  Prior to Admission medications   Medication Sig Start Date End Date Taking? Authorizing Provider  folic acid (FOLVITE) 1 MG tablet Take 1 tablet (1 mg total) by mouth daily. 09/22/18  Yes Narda Bonds, MD  gabapentin (NEURONTIN) 600 MG tablet Take 0.5 tablets (300 mg total) by mouth 2 (two) times daily. 09/21/18  Yes Narda Bonds, MD  levETIRAcetam (KEPPRA) 1000 MG tablet Take 1 tablet (1,000 mg total) by mouth 2 (two) times daily. 09/28/18  Yes Penumalli, Glenford Bayley, MD  Multiple Vitamin (MULTIVITAMIN WITH MINERALS) TABS tablet Take 1 tablet by mouth daily. 09/22/18  Yes Narda Bonds, MD  pyridOXINE (VITAMIN B-6) 100 MG tablet Take 100 mg by mouth daily.   Yes [provider]  QUEtiapine (SEROQUEL) 50 MG tablet Take 1 tablet (50 mg total) by mouth at bedtime. 09/21/18  Yes Narda Bonds, MD  cloNIDine (CATAPRES) 0.1 MG tablet Take 1 tablet (0.1 mg total) by mouth 3 (three) times daily for 1 day, THEN 1 tablet (0.1 mg total) 2 (two) times daily for  1 day, THEN 1 tablet (0.1 mg total) daily for 1 day. 09/21/18 09/24/18  Narda Bonds, MD  feeding supplement, ENSURE ENLIVE, (ENSURE ENLIVE) LIQD Take 237 mLs by mouth 2 (two)  times daily between meals. 09/22/18   Narda Bonds, MD    The patient is critically ill with multiple organ systems failure and requires high complexity decision making for assessment and support, frequent evaluation and titration of therapies, application of advanced monitoring technologies and extensive interpretation of multiple databases.   Critical Care Time devoted to patient care services described in this note is  32  Minutes. This time reflects time of care of this signee Dr Koren Bound. This critical care time does not reflect procedure time, or teaching time or supervisory time of PA/NP/Med student/Med Resident etc but could involve care discussion time.  Alyson Reedy, M.D. [facility]. Pager: [phone]. After hours pager: [phone].

## 2018-12-23 ENCOUNTER — Inpatient Hospital Stay (HOSPITAL_COMMUNITY): Payer: BLUE CROSS/BLUE SHIELD

## 2018-12-23 DIAGNOSIS — G9340 Encephalopathy, unspecified: Secondary | ICD-10-CM

## 2018-12-23 DIAGNOSIS — L8990 Pressure ulcer of unspecified site, unspecified stage: Secondary | ICD-10-CM

## 2018-12-23 LAB — CBC
HCT: 29.8 % — ABNORMAL LOW (ref 39.0–52.0)
Hemoglobin: 9.8 g/dL — ABNORMAL LOW (ref 13.0–17.0)
MCH: 34.1 pg — ABNORMAL HIGH (ref 26.0–34.0)
MCHC: 32.9 g/dL (ref 30.0–36.0)
MCV: 103.8 fL — ABNORMAL HIGH (ref 80.0–100.0)
PLATELETS: 145 10*3/uL — AB (ref 150–400)
RBC: 2.87 MIL/uL — ABNORMAL LOW (ref 4.22–5.81)
RDW: 13.3 % (ref 11.5–15.5)
WBC: 5.5 10*3/uL (ref 4.0–10.5)
nRBC: 0 % (ref 0.0–0.2)

## 2018-12-23 LAB — BLOOD GAS, ARTERIAL
Acid-base deficit: 2.5 mmol/L — ABNORMAL HIGH (ref 0.0–2.0)
Bicarbonate: 22 mmol/L (ref 20.0–28.0)
Drawn by: 31101
FIO2: 30
MECHVT: 580 mL
O2 SAT: 95.6 %
PEEP: 5 cmH2O
Patient temperature: 98.6
RATE: 18 resp/min
pCO2 arterial: 39.2 mmHg (ref 32.0–48.0)
pH, Arterial: 7.368 (ref 7.350–7.450)
pO2, Arterial: 72.9 mmHg — ABNORMAL LOW (ref 83.0–108.0)

## 2018-12-23 LAB — GLUCOSE, CAPILLARY
Glucose-Capillary: 106 mg/dL — ABNORMAL HIGH (ref 70–99)
Glucose-Capillary: 109 mg/dL — ABNORMAL HIGH (ref 70–99)
Glucose-Capillary: 113 mg/dL — ABNORMAL HIGH (ref 70–99)
Glucose-Capillary: 113 mg/dL — ABNORMAL HIGH (ref 70–99)
Glucose-Capillary: 114 mg/dL — ABNORMAL HIGH (ref 70–99)
Glucose-Capillary: 95 mg/dL (ref 70–99)

## 2018-12-23 LAB — BASIC METABOLIC PANEL
Anion gap: 7 (ref 5–15)
BUN: 25 mg/dL — ABNORMAL HIGH (ref 6–20)
CO2: 22 mmol/L (ref 22–32)
Calcium: 8.3 mg/dL — ABNORMAL LOW (ref 8.9–10.3)
Chloride: 111 mmol/L (ref 98–111)
Creatinine, Ser: 1.54 mg/dL — ABNORMAL HIGH (ref 0.61–1.24)
GFR calc Af Amer: >60 mL/min (ref >60)
GFR, EST NON AFRICAN AMERICAN: 58 mL/min — AB (ref >60)
Glucose, Bld: 122 mg/dL — ABNORMAL HIGH (ref 70–99)
POTASSIUM: 4.4 mmol/L (ref 3.5–5.1)
Sodium: 140 mmol/L (ref 135–145)

## 2018-12-23 LAB — MAGNESIUM: Magnesium: 1.6 mg/dL — ABNORMAL LOW (ref 1.7–2.4)

## 2018-12-23 LAB — PHOSPHORUS: Phosphorus: 2.1 mg/dL — ABNORMAL LOW (ref 2.5–4.6)

## 2018-12-23 MED ORDER — QUETIAPINE FUMARATE 50 MG PO TABS
50.0000 mg | ORAL_TABLET | Freq: Every day | ORAL | Status: DC
  Administered 2018-12-23 – 2018-12-25 (×3): 50 mg via ORAL
  Filled 2018-12-23 (×3): qty 1

## 2018-12-23 MED ORDER — LEVETIRACETAM 500 MG PO TABS
1000.0000 mg | ORAL_TABLET | Freq: Two times a day (BID) | ORAL | Status: DC
  Administered 2018-12-23 – 2019-01-02 (×20): 1000 mg via ORAL
  Filled 2018-12-23 (×21): qty 2

## 2018-12-23 MED ORDER — GABAPENTIN 300 MG PO CAPS
300.0000 mg | ORAL_CAPSULE | Freq: Two times a day (BID) | ORAL | Status: DC
  Administered 2018-12-23 – 2018-12-30 (×14): 300 mg via ORAL
  Filled 2018-12-23 (×14): qty 1

## 2018-12-23 MED ORDER — MAGNESIUM SULFATE 2 GM/50ML IV SOLN
2.0000 g | Freq: Once | INTRAVENOUS | Status: AC
  Administered 2018-12-23: 2 g via INTRAVENOUS
  Filled 2018-12-23: qty 50

## 2018-12-23 MED ORDER — VITAMIN B-6 100 MG PO TABS
100.0000 mg | ORAL_TABLET | Freq: Every day | ORAL | Status: DC
  Administered 2018-12-24 – 2019-01-02 (×10): 100 mg via ORAL
  Filled 2018-12-23 (×10): qty 1

## 2018-12-23 MED ORDER — LIVING BETTER WITH HEART FAILURE BOOK: Freq: Once | Status: DC

## 2018-12-23 MED ORDER — FOLIC ACID 1 MG PO TABS
1.0000 mg | ORAL_TABLET | Freq: Every day | ORAL | Status: DC
  Administered 2018-12-24 – 2019-01-02 (×10): 1 mg via ORAL
  Filled 2018-12-23 (×10): qty 1

## 2018-12-23 MED ORDER — PANTOPRAZOLE SODIUM 40 MG PO TBEC
40.0000 mg | DELAYED_RELEASE_TABLET | Freq: Every day | ORAL | Status: DC
  Administered 2018-12-23 – 2019-01-02 (×11): 40 mg via ORAL
  Filled 2018-12-23 (×12): qty 1

## 2018-12-23 MED ORDER — CHLORDIAZEPOXIDE HCL 25 MG PO CAPS
25.0000 mg | ORAL_CAPSULE | Freq: Three times a day (TID) | ORAL | Status: DC
  Administered 2018-12-23 – 2018-12-24 (×2): 25 mg via ORAL
  Filled 2018-12-23 (×2): qty 1

## 2018-12-23 MED ORDER — PHENOL 1.4 % MT LIQD
1.0000 | OROMUCOSAL | Status: DC | PRN
  Administered 2018-12-23: 1 via OROMUCOSAL
  Filled 2018-12-23: qty 177

## 2018-12-23 MED ORDER — VITAMIN B-1 100 MG PO TABS
100.0000 mg | ORAL_TABLET | Freq: Every day | ORAL | Status: DC
  Administered 2018-12-24 – 2019-01-02 (×10): 100 mg via ORAL
  Filled 2018-12-23 (×10): qty 1

## 2018-12-23 MED ORDER — SODIUM PHOSPHATES 45 MMOLE/15ML IV SOLN
20.0000 mmol | Freq: Once | INTRAVENOUS | Status: AC
  Administered 2018-12-23: 20 mmol via INTRAVENOUS
  Filled 2018-12-23: qty 6.67

## 2018-12-23 MED ORDER — ADULT MULTIVITAMIN W/MINERALS CH
1.0000 | ORAL_TABLET | Freq: Every day | ORAL | Status: DC
  Administered 2018-12-24 – 2019-01-02 (×10): 1 via ORAL
  Filled 2018-12-23 (×10): qty 1

## 2018-12-23 NOTE — Progress Notes (Signed)
CSW acknowledges consult for Substance abuse resources. CSW aware that pt is still intubated at this time. CSW will assess pt once pt has become more stable for further resources needs. CSW following.     Claude Manges Chestina Komatsu, MSW, LCSW-A Emergency Department Clinical Social Worker [phone]

## 2018-12-23 NOTE — Progress Notes (Signed)
Nutrition Follow-up  DOCUMENTATION CODES:   Not applicable  INTERVENTION:   Monitor for adequacy of intake as diet is advanced and add PO supplements as needed.  NUTRITION DIAGNOSIS:   Inadequate oral intake related to inability to eat as evidenced by NPO status.  Ongoing  GOAL:   Patient will meet greater than or equal to 90% of their needs  Unmet  MONITOR:   Vent status, TF tolerance, Labs, Skin, I & O's  ASSESSMENT:   33 yo male with PMH of seizures, alcohol abuse, tobacco abuse, PDA repair at age 23 who was admitted with group A strep bacteremia, alcohol withdrawal, and cardiomyopathy. Required ICU transfer and intubation on 3/11.   Patient just extubated this morning.  Remains NPO. SLP evaluation ordered.   Labs reviewed. Phosphorus 2.1 (L), magnesium 1.6 (L) CBG's: [phone] Medications reviewed and include folic acid, MVI, vitamin B-6, thiamine.   Diet Order:   Diet Order            Diet NPO time specified  Diet effective now              EDUCATION NEEDS:   No education needs have been identified at this time  Skin:  Skin Assessment: Skin Integrity Issues: Skin Integrity Issues:: Stage II, Other (Comment) Stage II: L heel Other: MASD to coccyx, scrotum  Last BM:  3/11 (type 6, green, large)  Height:   Ht Readings from Last 1 Encounters:  12/21/18 5\' 10"  (1.778 m)    Weight:   Wt Readings from Last 1 Encounters:  12/23/18 97.9 kg  12/17/18 92.8 kg  Ideal Body Weight:  75.5 kg  BMI:  29.4 (using admit weight)  Estimated Nutritional Needs:   Kcal:  2150  Protein:  125-145 gm  Fluid:  2.2 L    Joaquin Courts, RD, LDN, CNSC Pager [phone] After Hours Pager [phone]

## 2018-12-23 NOTE — Progress Notes (Addendum)
[facility]   Reason for visit: Follow up on bacteremia  Interval History: Remains intubated in the ICU.  Repeat blood cultures ngtd; afebrile, WBC wnl. History unobtainable from the patient; Tmax 102.4 Family at the bedside   Physical Exam: Constitutional:  Vitals:   12/23/18 1000 12/23/18 1025  BP:    Pulse:  97  Resp:  (!) 26  Temp:    SpO2: 95% 97%   patient appears in NAD Eyes: anicteric HENT: +ET Respiratory: respiratory effort on vent; CTA B, anterior exam Cardiovascular: tachy RR GI: soft, nt, nd Skin: resolved cellulitis  Review of Systems: Unable to be assessed due to patient factors  Lab Results  Component Value Date   WBC 5.5 12/23/2018   HGB 9.8 (L) 12/23/2018   HCT 29.8 (L) 12/23/2018   MCV 103.8 (H) 12/23/2018   PLT 145 (L) 12/23/2018    Lab Results  Component Value Date   CREATININE 1.54 (H) 12/23/2018   BUN 25 (H) 12/23/2018   NA 140 12/23/2018   K 4.4 12/23/2018   CL 111 12/23/2018   CO2 22 12/23/2018    Lab Results  Component Value Date   ALT 285 (H) 12/21/2018   AST 158 (H) 12/21/2018   ALKPHOS 45 12/21/2018     Microbiology: Recent Results (from the past 240 hour(s))  Respiratory Panel by PCR     Status: None   Collection Time: 12/18/18 12:18 PM  Result Value Ref Range Status   Adenovirus NOT DETECTED NOT DETECTED Final   Coronavirus 229E NOT DETECTED NOT DETECTED Final    Comment: (NOTE) The Coronavirus on the Respiratory Panel, DOES NOT test for the novel  Coronavirus (2019 nCoV)    Coronavirus HKU1 NOT DETECTED NOT DETECTED Final   Coronavirus NL63 NOT DETECTED NOT DETECTED Final   Coronavirus OC43 NOT DETECTED NOT DETECTED Final   Metapneumovirus NOT DETECTED NOT DETECTED Final   Rhinovirus / Enterovirus NOT DETECTED NOT DETECTED Final   Influenza A NOT DETECTED NOT DETECTED Final   Influenza B NOT DETECTED NOT DETECTED Final   Parainfluenza Virus 1 NOT DETECTED NOT DETECTED Final   Parainfluenza Virus 2 NOT DETECTED NOT DETECTED Final   Parainfluenza Virus 3 NOT DETECTED NOT DETECTED Final   Parainfluenza Virus 4 NOT DETECTED NOT DETECTED Final   Respiratory Syncytial Virus NOT DETECTED NOT DETECTED Final   Bordetella pertussis NOT DETECTED NOT DETECTED Final   Chlamydophila pneumoniae NOT DETECTED NOT DETECTED Final   Mycoplasma pneumoniae NOT DETECTED NOT DETECTED Final    Comment: Performed at [facility], [address], [place], [place] [address]  Culture, blood (routine x 2)     Status: Abnormal   Collection Time: 12/18/18  1:00 PM  Result Value Ref Range Status   Specimen Description BLOOD LEFT HAND  Final   Special Requests   Final    BOTTLES DRAWN AEROBIC ONLY Blood Culture adequate volume   Culture  Setup Time   Final    GRAM POSITIVE COCCI IN CHAINS AEROBIC BOTTLE ONLY CRITICAL RESULT CALLED TO, READ BACK BY AND VERIFIED WITH: J.LADFORD,PHARMD AT 0450 ON 12/19/18 BY G.MCADOO    Culture (A)  Final    GROUP A STREP (S.PYOGENES) ISOLATED HEALTH DEPARTMENT NOTIFIED Performed at [facility], [address], [place], [place] [address]    Report Status 12/21/2018 FINAL  Final   Organism ID, Bacteria GROUP A STREP (S.PYOGENES) ISOLATED  Final  Susceptibility   Group a strep (s.pyogenes) isolated - MIC*    PENICILLIN <=0.06 SENSITIVE Sensitive     CEFTRIAXONE <=0.12 SENSITIVE Sensitive     ERYTHROMYCIN 2 RESISTANT Resistant     LEVOFLOXACIN <=0.25 SENSITIVE Sensitive     VANCOMYCIN <=0.12 SENSITIVE Sensitive     * GROUP A STREP (S.PYOGENES) ISOLATED  Blood Culture ID Panel (Reflexed)     Status: Abnormal   Collection Time: 12/18/18  1:00 PM  Result Value Ref Range Status   Enterococcus species NOT DETECTED NOT DETECTED Final   Listeria monocytogenes NOT DETECTED NOT DETECTED Final   Staphylococcus species NOT DETECTED NOT DETECTED Final   Staphylococcus aureus (BCID) NOT DETECTED NOT DETECTED Final   Streptococcus species DETECTED (A)  NOT DETECTED Final    Comment: CRITICAL RESULT CALLED TO, READ BACK BY AND VERIFIED WITH: J.LADFORD,PHARMD AT 0450 ON 12/19/18 BY G.MCADOO    Streptococcus agalactiae NOT DETECTED NOT DETECTED Final   Streptococcus pneumoniae NOT DETECTED NOT DETECTED Final   Streptococcus pyogenes DETECTED (A) NOT DETECTED Final    Comment: CRITICAL RESULT CALLED TO, READ BACK BY AND VERIFIED WITH: J.LADFORD,PHARMD AT 0450 ON 12/19/18 BY G.MCADOO    Acinetobacter baumannii NOT DETECTED NOT DETECTED Final   Enterobacteriaceae species NOT DETECTED NOT DETECTED Final   Enterobacter cloacae complex NOT DETECTED NOT DETECTED Final   Escherichia coli NOT DETECTED NOT DETECTED Final   Klebsiella oxytoca NOT DETECTED NOT DETECTED Final   Klebsiella pneumoniae NOT DETECTED NOT DETECTED Final   Proteus species NOT DETECTED NOT DETECTED Final   Serratia marcescens NOT DETECTED NOT DETECTED Final   Haemophilus influenzae NOT DETECTED NOT DETECTED Final   Neisseria meningitidis NOT DETECTED NOT DETECTED Final   Pseudomonas aeruginosa NOT DETECTED NOT DETECTED Final   Candida albicans NOT DETECTED NOT DETECTED Final   Candida glabrata NOT DETECTED NOT DETECTED Final   Candida krusei NOT DETECTED NOT DETECTED Final   Candida parapsilosis NOT DETECTED NOT DETECTED Final   Candida tropicalis NOT DETECTED NOT DETECTED Final    Comment: Performed at [facility], [address], [place], [place] [address]  Culture, blood (routine x 2)     Status: Abnormal   Collection Time: 12/18/18  1:20 PM  Result Value Ref Range Status   Specimen Description BLOOD RIGHT HAND  Final   Special Requests   Final    BOTTLES DRAWN AEROBIC ONLY Blood Culture results may not be optimal due to an inadequate volume of blood received in culture bottles   Culture  Setup Time   Final    GRAM POSITIVE COCCI IN CHAINS AEROBIC BOTTLE ONLY CRITICAL VALUE NOTED.  VALUE IS CONSISTENT WITH PREVIOUSLY REPORTED AND CALLED VALUE.    Culture (A)   Final    GROUP A STREP (S.PYOGENES) ISOLATED SUSCEPTIBILITIES PERFORMED ON PREVIOUS CULTURE WITHIN THE LAST 5 DAYS. HEALTH DEPARTMENT NOTIFIED Performed at [facility], [address] [place], [place] [address]    Report Status 12/21/2018 FINAL  Final  Gastrointestinal Panel by PCR , Stool     Status: None   Collection Time: 12/18/18  3:19 PM  Result Value Ref Range Status   Campylobacter species NOT DETECTED NOT DETECTED Final   Plesimonas shigelloides NOT DETECTED NOT DETECTED Final   Salmonella species NOT DETECTED NOT DETECTED Final   Yersinia enterocolitica NOT DETECTED NOT DETECTED Final   Vibrio species NOT DETECTED NOT DETECTED Final   Vibrio cholerae NOT DETECTED NOT DETECTED Final   Enteroaggregative  E coli (EAEC) NOT DETECTED NOT DETECTED Final   Enteropathogenic E coli (EPEC) NOT DETECTED NOT DETECTED Final   Enterotoxigenic E coli (ETEC) NOT DETECTED NOT DETECTED Final   Shiga like toxin producing E coli (STEC) NOT DETECTED NOT DETECTED Final   Shigella/Enteroinvasive E coli (EIEC) NOT DETECTED NOT DETECTED Final   Cryptosporidium NOT DETECTED NOT DETECTED Final   Cyclospora cayetanensis NOT DETECTED NOT DETECTED Final   Entamoeba histolytica NOT DETECTED NOT DETECTED Final   Giardia lamblia NOT DETECTED NOT DETECTED Final   Adenovirus F40/41 NOT DETECTED NOT DETECTED Final   Astrovirus NOT DETECTED NOT DETECTED Final   Norovirus GI/GII NOT DETECTED NOT DETECTED Final   Rotavirus A NOT DETECTED NOT DETECTED Final   Sapovirus (I, II, IV, and V) NOT DETECTED NOT DETECTED Final    Comment: Performed at [facility], [address] [place], [place] [address]  Culture, blood (routine x 2)     Status: None (Preliminary result)   Collection Time: 12/19/18  9:13 AM  Result Value Ref Range Status   Specimen Description BLOOD RIGHT ANTECUBITAL  Final   Special Requests AEROBIC BOTTLE ONLY Blood Culture adequate volume  Final   Culture   Final    NO GROWTH 3  DAYS Performed at [facility], [address] [place], [place] [address]    Report Status PENDING  Incomplete  Culture, blood (routine x 2)     Status: None (Preliminary result)   Collection Time: 12/19/18  9:26 AM  Result Value Ref Range Status   Specimen Description BLOOD RIGHT THUMB  Final   Special Requests AEROBIC BOTTLE ONLY Blood Culture adequate volume  Final   Culture   Final    NO GROWTH 3 DAYS Performed at [facility], [address], [place], [place] [address]    Report Status PENDING  Incomplete  MRSA PCR Screening     Status: None   Collection Time: 12/21/18  8:38 AM  Result Value Ref Range Status   MRSA by PCR NEGATIVE NEGATIVE Final    Comment:        The GeneXpert MRSA Assay (FDA approved for NASAL specimens only), is one component of a comprehensive MRSA colonization surveillance program. It is not intended to diagnose MRSA infection nor to guide or monitor treatment for MRSA infections. Performed at [facility], [address] [place], [place] [address]     Impression/Plan:  1. Group A Strep bacteremia - clearing.  On penicillin and will continue.    2.  Alcohol withdrawal - Remains on precedex  3.  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   Dr. Orvan Falconer on tomorrow.

## 2018-12-23 NOTE — Progress Notes (Signed)
NAME:  Darren Diaz, MRN:  [id number], DOB:  1985-10-20, LOS: 6 ADMISSION DATE:  12/17/2018, CONSULTATION DATE:  12/21/18 REFERRING MD:  Jerral Ralph - [facility], CHIEF COMPLAINT:  Alcohol withdrawal, sepsis   Brief History   33 yo M admitted 3/7 after fall related to EtOH abuse. Over the weekend, patient became septic, growing group A strep. Patient worsening EtH withdrawal, increased TMax.  On morning of 3/11 patient acute respiratory decline, on 100% non-rebreather. PCCM consulted.   History of present illness   HPI per chart review, patient encephalopathic at time of consultation. 33 year old M with PMH EtOH abuse, seizure, who presented 12/17/18 after a friend heard the patient fall, and discovered him be seizing. The seizure lasted several minutes and the patient exhibited a post-ictal period. The patient had a recent history of nausea and vomiting several times prior to seizure. The patient denied abdominal pain, diarrhea. Last EtOH intake 3/7.   Past Medical History    Significant Hospital Events   3/7 admitted s/p seizure 3/9 Group A strep RLE cellulitis  3/11 transferred to ICU for worsening EtOH withdrawal, acute respiratory failure, sepsis   Consults:  Cardiology ID  PCCM  Procedures:  3/11 intubated >>>   Significant Diagnostic Tests:  3/9 ECHO> LVEF 35%, diffuse hypokinesis  3/8 RUQ US> poor visualization of gallbladder, appearance of thickened wall 5.82mm.  3/10 Renal US> no hydronephrosis, normal appearing bladder. No focal liver abnormalities  3/11 CXR> ETT placement, no pneumothorax   Micro Data:  3/8> group A strep (strep pyogenes) BCx  3/8 GI panel> negative 3/11 MRSA>>>   Antimicrobials:  PCN 3/9>>>  Interim history/subjective:  Combative overnight, no new complaints  Objective   Blood pressure (!) 151/96, pulse 81, temperature 99.6 F (37.6 C), temperature source Oral, resp. rate (!) 5, height 5\' 10"  (1.778 m), weight 97.9 kg, SpO2 97 %.    Vent Mode: PRVC  FiO2 (%):  [30 %-40 %] 30 % Set Rate:  [18 bmp] 18 bmp Vt Set:  [580 mL] 580 mL PEEP:  [5 cmH20] 5 cmH20 Plateau Pressure:  [16 cmH20-18 cmH20] 17 cmH20   Intake/Output Summary (Last 24 hours) at 12/23/2018 1021 Last data filed at 12/23/2018 0900 Gross per 24 hour  Intake 2781.75 ml  Output 2110 ml  Net 671.75 ml   Filed Weights   12/17/18 2300 12/23/18 0500  Weight: 92.8 kg 97.9 kg   Examination: General: Sedate but easily arousable HENT: [place]/AT, PERRL, EOM-I and MMM Lungs: CTA bilaterally Cardiovascular: RRR, Nl S1/S2 and -M/R/G Abdomen: Soft, NT, ND and +BS Extremities: Moves BUE BLE spontaneously, RLL erythematous over shin consistent with known cellulitis. BUE BLE 1+ edema  Neuro: Sedate but withdraws all ext to pain GU: Foley collecting yellow clear urine   Resolved Hospital Problem list     Assessment & Plan:   Acute Respiratory Failure with hypoxia - In setting of EtOH withdrawal, sepsis - Pulmonary edema appreciated on CXR, given lasix by hospitalist prior to transfer  - Intubated 3/11 - Pulmonary  P - Extubate today - IS - Flutter valve - Ambulate - PT - Strict I/O - D/C sedation - VAP prevention, pulmonary hygiene   Acute Encephalopathy  - Likely multifactorial secondary to sepsis, EtOH withdrawal, CNS depressing medications, history of seizure  - CTH negative for acute abnormalities - EEG no seizure activity  P - Continue abx for sepsis as below - Precedex, ativan, librium for EtOH withdrawal as below - Continue seroquel  - RASS goal  0 to -1  Alcohol Abuse Disorder with withdrawal P - ICU CIWA protocol  - Precedex gtt, PRN ativan, librium  - Multivitamin, thiamine, folate  - Seizure precautions   Sepsis due to streptococcus pyogenes bacteremia - RLL cellulitis  - ECHO negative for endocarditis  - Associated tachycardia  P - ID following appreciate recommendations - Continue PCN  - Trend WBC, fever, UOP  - Metop PRN for tachycardia    Seizure disorder - Non-compliant with outpatient Keppra - Witnessed seizure 3/7 prior to ED presentation P - Continue IV Keppra   Systolic heart failure - Newly diagnosed - ECHO EF 35% P - Cardiology consulted - BNP pending - S/p 40mg  Lasix 3/11 in setting of acute pulmonary edema, hold further sedation for now - Strict I/O   Acute Kidney Injury - Likely in setting of recent hemodynamic instability - Renal US unremarkable  P - Strict I/O - Monitor and trend BMP - Minimize nephrotoxic agents   Acute Liver injury with transaminitis  - Elevated LFTs down-trending - Likely in setting of sepsis/shock liver - RUQ US unremarkable for hepatic findings  P - Given that LFTs are improving and RUQ [place] neg, feel that it is ok to monitor and trend LFTs PRN   Nutrition and Electrolyte abnormalities - ?chronic malnutrition in setting of EtOH abuse - Hypokalemia, hypomagnesemia  P - Continue micronutrient supplementation as above - Monitor electrolytes, replete PRN   Tobacco use disorder P - Continue nicotine patch  Parents updated bedside 3/13  Labs   CBC: Recent Labs  Lab 12/19/18 0356 12/20/18 0332 12/21/18 0335 12/21/18 0929 12/22/18 0520 12/23/18 0234  WBC 6.9 5.7 7.5  --  6.0 5.5  HGB 12.7* 11.6* 13.6 11.9* 11.4* 9.8*  HCT 37.7* 32.4* 38.9* 35.0* 35.2* 29.8*  MCV 100.8* 99.7 101.6*  --  105.1* 103.8*  PLT 53* 60* 86*  --  113* 145*    Basic Metabolic Panel: Recent Labs  Lab 12/19/18 0356 12/20/18 0332 12/21/18 0335 12/21/18 0929 12/22/18 0520 12/22/18 1703 12/23/18 0234  NA 136 137 140 139 139  --  140  K 3.2* 3.4* 4.3 4.6 4.4  --  4.4  CL 103 110 114*  --  113*  --  111  CO2 22 21* 20*  --  21*  --  22  GLUCOSE 96 107* 93  --  109*  --  122*  BUN 10 19 18   --  26*  --  25*  CREATININE 0.99 1.53* 1.55*  --  1.74*  --  1.54*  CALCIUM 8.0* 8.0* 8.8*  --  8.3*  --  8.3*  MG 1.7  --  1.8  --  2.1 1.8 1.6*  PHOS  --   --   --   --  1.0* 2.2* 2.1*    GFR: Estimated Creatinine Clearance: 79.3 mL/min (A) (by C-G formula based on SCr of 1.54 mg/dL (H)). Recent Labs  Lab 12/18/18 1526 12/18/18 1912  12/20/18 0332 12/21/18 0335 12/21/18 0738 12/22/18 0520 12/23/18 0234  WBC  --   --    < > 5.7 7.5  --  6.0 5.5  LATICACIDVEN 2.2* 1.9  --   --   --  0.8  --   --    < > = values in this interval not displayed.    Liver Function Tests: Recent Labs  Lab 12/17/18 1850 12/18/18 1321 12/19/18 0356 12/20/18 0332 12/21/18 0335  AST 83* 138* 2,241* 549* 158*  ALT 51* 77*  750* 512* 285*  ALKPHOS 58 35* 23* 30* 45  BILITOT 1.3* 1.7* 1.6* 1.6* 2.2*  PROT 7.7 7.5 6.5 5.6* 6.1*  ALBUMIN 4.3 3.8 3.2* 2.7* 2.6*   No results for input(s): LIPASE, AMYLASE in the last 168 hours. Recent Labs  Lab 12/19/18 1128 12/20/18 0332  AMMONIA 28 34    ABG    Component Value Date/Time   PHART 7.368 12/23/2018 0225   PCO2ART 39.2 12/23/2018 0225   PO2ART 72.9 (L) 12/23/2018 0225   HCO3 22.0 12/23/2018 0225   TCO2 20 (L) 12/21/2018 0929   ACIDBASEDEF 2.5 (H) 12/23/2018 0225   O2SAT 95.6 12/23/2018 0225     Coagulation Profile: Recent Labs  Lab 12/19/18 0702 12/20/18 0332  INR 1.5* 1.2    Cardiac Enzymes: No results for input(s): CKTOTAL, CKMB, CKMBINDEX, TROPONINI in the last 168 hours.  HbA1C: Hgb A1c MFr Bld  Date/Time Value Ref Range Status  09/14/2018 04:28 AM 4.1 (L) 4.8 - 5.6 % Final    Comment:    (NOTE) Pre diabetes:          5.7%-6.4% Diabetes:              >6.4% Glycemic control for   <7.0% adults with diabetes     CBG: Recent Labs  Lab 12/22/18 1524 12/22/18 1956 12/22/18 2327 12/23/18 0313 12/23/18 0754  GLUCAP 99 115* 117* 114* 113*    The patient is critically ill with multiple organ systems failure and requires high complexity decision making for assessment and support, frequent evaluation and titration of therapies, application of advanced monitoring technologies and extensive interpretation of  multiple databases.   Critical Care Time devoted to patient care services described in this note is  32  Minutes. This time reflects time of care of this signee Dr Koren Bound. This critical care time does not reflect procedure time, or teaching time or supervisory time of PA/NP/Med student/Med Resident etc but could involve care discussion time.  Alyson Reedy, M.D. [facility] Pulmonary/Critical Care Medicine. Pager: [phone]. After hours pager: [phone].

## 2018-12-23 NOTE — Procedures (Signed)
Extubation Procedure Note  Patient Details:   Name: Darren Diaz DOB: 1986-09-29 MRN: [id number]   Airway Documentation:    Vent end date: 12/23/18 Vent end time: 1025   Evaluation  O2 sats: stable throughout Complications: No apparent complications Patient did tolerate procedure well. Bilateral Breath Sounds: Diminished   Yes   Patient extubated & placed on 6L [place]  Jacqulynn Cadet 12/23/2018, 10:33 AM

## 2018-12-23 NOTE — Evaluation (Signed)
Clinical/Bedside Swallow Evaluation Patient Details  Name: Darren Diaz MRN: [id number] Date of Birth: [birth year]  Today's Date: 12/23/2018 Time: SLP Start Time (ACUTE ONLY): 1605 SLP Stop Time (ACUTE ONLY): 1626 SLP Time Calculation (min) (ACUTE ONLY): 21 min  Past Medical History:  Past Medical History:  Diagnosis Date  . Alcohol abuse   . S/P PDA repair age 33  . Seizures (HCC) 08/2018  . Tobacco abuse    Past Surgical History:  Past Surgical History:  Procedure Laterality Date  . CHL IP CC INTUBATION ORDABLE  12/21/2018      . 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 Per chart patient became septic over the weekend. EtoH withdrawal. Developed acute respiratory distress 3/11 intubated and extubated morning of 3/13. CXR Atelectasis or pneumonia at the bases with layering pleural fluid.   Assessment / Plan / Recommendation Clinical Impression  Pt's vocal intensity is low and quality is fair-good without significant dysphonia following 2 day intubation. He is alert, denies odonophagia and consistently produces a strong volitional cough; oral-motor exam unremarkable. He is somewhat groggy and confused thought to be short term once sedation clears after being intubated. Initally unable to complete 3 oz water test due to confusion over directions. He subsequently consumed consecutive straw sips thin with one immediate (with pill) and one delayed throat clear. Orally he masticated solid and propelled without impairments. Observed single pill consumption with straw sips water with one delayed throat clear; recommended RN offer last pill whole in applesauce which he appeared to tolerate better. Recommend regular texture diet with family assisting ordering softer textures initially, thin liquids, ask pt to cough intermittently. Will follow up briefly next week.      SLP Visit Diagnosis: Dysphagia, unspecified (R13.10)     Aspiration Risk  Mild aspiration risk    Diet Recommendation Regular;Thin liquid   Liquid Administration via: Straw;Cup Medication Administration: Whole meds with puree Supervision: Patient able to self feed;Intermittent supervision to cue for compensatory strategies;Comment(will need assist feeding initially) Compensations: Minimize environmental distractions;Slow rate;Small sips/bites Postural Changes: Seated upright at 90 degrees    Other  Recommendations Oral Care Recommendations: Oral care BID   Follow up Recommendations None      Frequency and Duration min 1 x/week  2 weeks       Prognosis Prognosis for Safe Diet Advancement: Good      Swallow Study   General HPI: 34 yo admitted 3/7 after fall related to EtOH abuse. Per chart patient became septic over the weekend. EtoH withdrawal. Developed acute respiratory distress 3/11 intubated and extubated morning of 3/13. CXR Atelectasis or pneumonia at the bases with layering pleural fluid. Type of Study: Bedside Swallow Evaluation Previous Swallow Assessment: (none) Diet Prior to this Study: NPO Temperature Spikes Noted: No Respiratory Status: Room air History of Recent Intubation: Yes Length of Intubations (days): 3 days Date extubated: 12/23/18(am) Behavior/Cognition: Cooperative;Pleasant mood;Confused;Lethargic/Drowsy Oral Cavity Assessment: Within Functional Limits Oral Care Completed by SLP: Yes Oral Cavity - Dentition: Adequate natural dentition Vision: Functional for self-feeding Self-Feeding Abilities: Able to feed self;Needs set up Patient Positioning: Upright in bed Baseline Vocal Quality: Low vocal intensity Volitional Cough: Strong Volitional Swallow: Able to elicit    Oral/Motor/Sensory Function Overall Oral Motor/Sensory Function: Within functional limits   Ice Chips Ice chips: Not tested   Thin Liquid Thin Liquid: Impaired Presentation: Cup;Straw Oral Phase Impairments: (none) Oral Phase Functional  Implications: (none) Pharyngeal  Phase Impairments: Throat Clearing - Delayed;Throat Clearing - Immediate    Nectar Thick Nectar Thick Liquid: Not tested   Honey Thick Honey Thick Liquid: Not tested   Puree Puree: Within functional limits   Solid     Solid: Within functional limits      Royce Macadamia 12/23/2018,5:07 PM   Breck Coons Lonell Face.Ed Nurse, children's [phone] Office [phone]

## 2018-12-24 DIAGNOSIS — M009 Pyogenic arthritis, unspecified: Secondary | ICD-10-CM | POA: Diagnosis present

## 2018-12-24 DIAGNOSIS — G8929 Other chronic pain: Secondary | ICD-10-CM

## 2018-12-24 DIAGNOSIS — I5021 Acute systolic (congestive) heart failure: Secondary | ICD-10-CM

## 2018-12-24 DIAGNOSIS — R918 Other nonspecific abnormal finding of lung field: Secondary | ICD-10-CM

## 2018-12-24 LAB — BASIC METABOLIC PANEL
Anion gap: 11 (ref 5–15)
BUN: 19 mg/dL (ref 6–20)
CALCIUM: 8.4 mg/dL — AB (ref 8.9–10.3)
CO2: 19 mmol/L — ABNORMAL LOW (ref 22–32)
Chloride: 107 mmol/L (ref 98–111)
Creatinine, Ser: 1.46 mg/dL — ABNORMAL HIGH (ref 0.61–1.24)
GFR calc Af Amer: >60 mL/min (ref >60)
GFR calc non Af Amer: >60 mL/min (ref >60)
Glucose, Bld: 105 mg/dL — ABNORMAL HIGH (ref 70–99)
Potassium: 3.1 mmol/L — ABNORMAL LOW (ref 3.5–5.1)
SODIUM: 137 mmol/L (ref 135–145)

## 2018-12-24 LAB — CULTURE, BLOOD (ROUTINE X 2)
CULTURE: NO GROWTH
Culture: NO GROWTH
Special Requests: ADEQUATE
Special Requests: ADEQUATE

## 2018-12-24 LAB — CBC
HCT: 30.7 % — ABNORMAL LOW (ref 39.0–52.0)
Hemoglobin: 10.7 g/dL — ABNORMAL LOW (ref 13.0–17.0)
MCH: 34.5 pg — ABNORMAL HIGH (ref 26.0–34.0)
MCHC: 34.9 g/dL (ref 30.0–36.0)
MCV: 99 fL (ref 80.0–100.0)
PLATELETS: 218 10*3/uL (ref 150–400)
RBC: 3.1 MIL/uL — ABNORMAL LOW (ref 4.22–5.81)
RDW: 12.6 % (ref 11.5–15.5)
WBC: 7.2 10*3/uL (ref 4.0–10.5)
nRBC: 0 % (ref 0.0–0.2)

## 2018-12-24 LAB — GLUCOSE, CAPILLARY
Glucose-Capillary: 83 mg/dL (ref 70–99)
Glucose-Capillary: 116 mg/dL — ABNORMAL HIGH (ref 70–99)

## 2018-12-24 LAB — C DIFFICILE QUICK SCREEN W PCR REFLEX
C Diff antigen: NEGATIVE
C Diff interpretation: NOT DETECTED
C Diff toxin: NEGATIVE

## 2018-12-24 LAB — PHOSPHORUS: Phosphorus: 3.1 mg/dL (ref 2.5–4.6)

## 2018-12-24 LAB — MAGNESIUM: Magnesium: 1.6 mg/dL — ABNORMAL LOW (ref 1.7–2.4)

## 2018-12-24 MED ORDER — METHOCARBAMOL 500 MG PO TABS
500.0000 mg | ORAL_TABLET | Freq: Three times a day (TID) | ORAL | Status: DC | PRN
  Administered 2018-12-24 – 2018-12-26 (×3): 500 mg via ORAL
  Filled 2018-12-24 (×4): qty 1

## 2018-12-24 MED ORDER — HYDRALAZINE HCL 20 MG/ML IJ SOLN
10.0000 mg | Freq: Once | INTRAMUSCULAR | Status: AC
  Administered 2018-12-24: 10 mg via INTRAVENOUS
  Filled 2018-12-24: qty 1

## 2018-12-24 MED ORDER — OXYCODONE HCL 5 MG PO TABS
5.0000 mg | ORAL_TABLET | ORAL | Status: DC | PRN
  Administered 2018-12-24 – 2019-01-02 (×13): 10 mg via ORAL
  Filled 2018-12-24 (×14): qty 2

## 2018-12-24 MED ORDER — CARVEDILOL 6.25 MG PO TABS
6.2500 mg | ORAL_TABLET | Freq: Two times a day (BID) | ORAL | Status: DC
  Administered 2018-12-24 – 2018-12-25 (×2): 6.25 mg via ORAL
  Filled 2018-12-24 (×3): qty 1

## 2018-12-24 MED ORDER — HYDRALAZINE HCL 20 MG/ML IJ SOLN
5.0000 mg | INTRAMUSCULAR | Status: DC | PRN
  Administered 2018-12-25: 5 mg via INTRAVENOUS
  Filled 2018-12-24 (×2): qty 1

## 2018-12-24 MED ORDER — NAPROXEN 250 MG PO TABS: 500.0000 mg | ORAL_TABLET | Freq: Two times a day (BID) | ORAL | Status: DC | PRN

## 2018-12-24 MED ORDER — LOPERAMIDE HCL 2 MG PO CAPS
2.0000 mg | ORAL_CAPSULE | ORAL | Status: AC | PRN
  Administered 2018-12-24: 4 mg via ORAL
  Administered 2018-12-25 (×2): 2 mg via ORAL
  Administered 2018-12-25 – 2018-12-26 (×2): 4 mg via ORAL
  Filled 2018-12-24 (×6): qty 2

## 2018-12-24 MED ORDER — DICYCLOMINE HCL 20 MG PO TABS
20.0000 mg | ORAL_TABLET | Freq: Four times a day (QID) | ORAL | Status: AC | PRN
  Filled 2018-12-24: qty 1

## 2018-12-24 MED ORDER — MAGNESIUM SULFATE 4 GM/100ML IV SOLN
4.0000 g | Freq: Once | INTRAVENOUS | Status: AC
  Administered 2018-12-24: 4 g via INTRAVENOUS
  Filled 2018-12-24: qty 100

## 2018-12-24 MED ORDER — ACETAMINOPHEN 325 MG PO TABS: 650.0000 mg | ORAL_TABLET | Freq: Four times a day (QID) | ORAL | Status: DC | PRN

## 2018-12-24 MED ORDER — HYDROXYZINE HCL 25 MG PO TABS: 25.0000 mg | ORAL_TABLET | Freq: Four times a day (QID) | ORAL | Status: DC | PRN

## 2018-12-24 MED ORDER — CLONIDINE HCL 0.1 MG PO TABS
0.1000 mg | ORAL_TABLET | ORAL | Status: DC
  Administered 2018-12-26: 0.1 mg via ORAL
  Filled 2018-12-24: qty 1

## 2018-12-24 MED ORDER — ACETAMINOPHEN 500 MG PO TABS
500.0000 mg | ORAL_TABLET | Freq: Four times a day (QID) | ORAL | Status: DC | PRN
  Administered 2018-12-24 – 2018-12-27 (×7): 500 mg via ORAL
  Filled 2018-12-24 (×7): qty 1

## 2018-12-24 MED ORDER — POTASSIUM CHLORIDE CRYS ER 20 MEQ PO TBCR
40.0000 meq | EXTENDED_RELEASE_TABLET | Freq: Three times a day (TID) | ORAL | Status: AC
  Administered 2018-12-24 – 2018-12-25 (×6): 40 meq via ORAL
  Filled 2018-12-24 (×6): qty 2

## 2018-12-24 MED ORDER — LORAZEPAM 2 MG/ML IJ SOLN
2.0000 mg | INTRAMUSCULAR | Status: DC | PRN
  Administered 2018-12-24 – 2018-12-25 (×2): 2 mg via INTRAVENOUS
  Administered 2018-12-25: 3 mg via INTRAVENOUS
  Administered 2018-12-25: 2 mg via INTRAVENOUS
  Administered 2018-12-25 (×5): 3 mg via INTRAVENOUS
  Administered 2018-12-25: 2 mg via INTRAVENOUS
  Administered 2018-12-25 – 2018-12-26 (×5): 3 mg via INTRAVENOUS
  Filled 2018-12-24 (×4): qty 2
  Filled 2018-12-24 (×2): qty 1
  Filled 2018-12-24: qty 2
  Filled 2018-12-24: qty 1
  Filled 2018-12-24 (×7): qty 2
  Filled 2018-12-24: qty 1
  Filled 2018-12-24: qty 2

## 2018-12-24 MED ORDER — OXYCODONE-ACETAMINOPHEN 5-325 MG PO TABS
1.0000 | ORAL_TABLET | ORAL | Status: DC | PRN
  Administered 2018-12-24 (×2): 1 via ORAL
  Filled 2018-12-24 (×2): qty 1

## 2018-12-24 MED ORDER — LABETALOL HCL 5 MG/ML IV SOLN
10.0000 mg | INTRAVENOUS | Status: DC | PRN
  Administered 2018-12-24: 10 mg via INTRAVENOUS
  Filled 2018-12-24: qty 4

## 2018-12-24 MED ORDER — ONDANSETRON 4 MG PO TBDP: 4.0000 mg | ORAL_TABLET | Freq: Four times a day (QID) | ORAL | Status: DC | PRN

## 2018-12-24 MED ORDER — CLONIDINE HCL 0.1 MG PO TABS: 0.1000 mg | ORAL_TABLET | Freq: Every day | ORAL | Status: DC

## 2018-12-24 MED ORDER — ACETAMINOPHEN 500 MG PO TABS
500.0000 mg | ORAL_TABLET | Freq: Once | ORAL | Status: AC
  Administered 2018-12-24: 500 mg via ORAL
  Filled 2018-12-24: qty 1

## 2018-12-24 MED ORDER — CLONIDINE HCL 0.1 MG PO TABS
0.1000 mg | ORAL_TABLET | Freq: Four times a day (QID) | ORAL | Status: AC
  Administered 2018-12-24 – 2018-12-25 (×8): 0.1 mg via ORAL
  Filled 2018-12-24 (×9): qty 1

## 2018-12-24 NOTE — Progress Notes (Signed)
[facility] notified of pt's temp 101.4 (o). Tylenol given @1855 , CX 12/19/18-no growth, pt already on PCN G, IV. Will continue to monitor.

## 2018-12-24 NOTE — Progress Notes (Signed)
[facility] TEAM 1 - Stepdown/ICU TEAM  Darren Diaz  DGL:[id number] DOB: Aug 18, 1986 DOA: 12/17/2018 PCP: Patient, No Pcp Per    Brief Narrative:  32yo w/ a Hx of EtOH abuse and seizure who presented 12/17/18 after a friend heard him fall, and discovered he was seizing. Following admission he was found to have Group A Strep bacteremia, and required ICU transfer for severe EtOH withdrawal.   Significant Events: 3/7 admit s/p seizure 3/9 Group A Strep RLE cellulitis  3/9 TTE - EF 35% w/ diffuse hypokinesis  3/11 tx to ICU - worsening EtOH withdrawal, acute respiratory failure, sepsis - intubated 3/13 extubated 3/14 TRH resumed care   Subjective: The patient is lethargic but able to be awoken with verbal stimulation.  He reports ongoing right shoulder pain.  He denies chest pain nausea vomiting or abdominal pain.  Assessment & Plan:  Acute Respiratory Failure with hypoxia Due to EtOH withdrawal + sepsis - required intubation - extubated 3/13 -respiratory status stable at this time  Acute Encephalopathy  Multifactorial: sepsis, EtOH withdrawal, meds, seizure - CT head negative for acute findings - EEG no seizure activity   Alcohol Abuse with withdrawal now off Precedex drip -transition to clonidine withdrawal protocol -monitor closely with high risk for recurrent seizure activity/agitation  Sepsis due to Streptococcus pyogenes R LE cellulitis w/  bacteremia Care as suggested by ID -continue antibiotic therapy  Seizure disorder Non-compliant with outpatient Keppra  Newly diagnoses Systolic heart failure EF 35% on TTE - Cards following - to add Entresto when renal fxn normal   Uncontrolled HTN Blood pressure medications being titrated  Acute Kidney Injury Renal US unremarkable - crt slowly improving -monitor trends with attempt to diurese  Hypokalemia Supplement to goal of 4.0  Hypomagnesemia   Supplement to goal of 2.0  Acute Liver injury with transaminitis  down-trending  - likely shock liver - RUQ US unremarkable for hepatic findings -monitor trend  Tobacco use disorder  DVT prophylaxis: lovenox  Code Status: FULL CODE Family Communication: Spoke with mother, who is an Charity fundraiser, at bedside Disposition Plan: Monitoring current bed due to high likelihood of recurrent severe withdrawal symptoms  Consultants:  Cardiology ID  PCCM  Antimicrobials:  PCN G 3/9 >  Objective: Blood pressure (!) 177/92, pulse (!) 104, temperature 99.9 F (37.7 C), temperature source Oral, resp. rate (!) 35, height 5\' 10"  (1.778 m), weight 98.9 kg, SpO2 91 %.  Intake/Output Summary (Last 24 hours) at 12/24/2018 0941 Last data filed at 12/24/2018 0600 Gross per 24 hour  Intake 2314.66 ml  Output 1455 ml  Net 859.66 ml   Filed Weights   12/17/18 2300 12/23/18 0500 12/24/18 0500  Weight: 92.8 kg 97.9 kg 98.9 kg    Examination: General: No acute respiratory distress - lethargic   Lungs: Clear to auscultation bilaterally without wheezes or crackles Cardiovascular: Regular rate and rhythm without murmur gallop or rub normal S1 and S2 Abdomen: Nontender, nondistended, soft, bowel sounds positive, no rebound, no ascites, no appreciable mass Extremities: 2+ pitting edema bilateral lower extremities -erythema of right lower extremity has resolved  CBC: Recent Labs  Lab 12/22/18 0520 12/23/18 0234 12/24/18 0616  WBC 6.0 5.5 7.2  HGB 11.4* 9.8* 10.7*  HCT 35.2* 29.8* 30.7*  MCV 105.1* 103.8* 99.0  PLT 113* 145* 218   Basic Metabolic Panel: Recent Labs  Lab 12/22/18 0520 12/22/18 1703 12/23/18 0234 12/24/18 0616  NA 139  --  140 137  K 4.4  --  4.4 3.1*  CL 113*  --  111 107  CO2 21*  --  22 19*  GLUCOSE 109*  --  122* 105*  BUN 26*  --  25* 19  CREATININE 1.74*  --  1.54* 1.46*  CALCIUM 8.3*  --  8.3* 8.4*  MG 2.1 1.8 1.6* 1.6*  PHOS 1.0* 2.2* 2.1* 3.1   GFR: Estimated Creatinine Clearance: 84.1 mL/min (A) (by C-G formula based on SCr of 1.46 mg/dL  (H)).  Liver Function Tests: Recent Labs  Lab 12/18/18 1321 12/19/18 0356 12/20/18 0332 12/21/18 0335  AST 138* 2,241* 549* 158*  ALT 77* 750* 512* 285*  ALKPHOS 35* 23* 30* 45  BILITOT 1.7* 1.6* 1.6* 2.2*  PROT 7.5 6.5 5.6* 6.1*  ALBUMIN 3.8 3.2* 2.7* 2.6*    Recent Labs  Lab 12/19/18 1128 12/20/18 0332  AMMONIA 28 34    Coagulation Profile: Recent Labs  Lab 12/19/18 0702 12/20/18 0332  INR 1.5* 1.2    HbA1C: Hgb A1c MFr Bld  Date/Time Value Ref Range Status  09/14/2018 04:28 AM 4.1 (L) 4.8 - 5.6 % Final    Comment:    (NOTE) Pre diabetes:          5.7%-6.4% Diabetes:              >6.4% Glycemic control for   <7.0% adults with diabetes     CBG: Recent Labs  Lab 12/23/18 1135 12/23/18 1540 12/23/18 2024 12/23/18 2352 12/24/18 0457  GLUCAP 113* 106* 109* 95 83    Recent Results (from the past 240 hour(s))  Respiratory Panel by PCR     Status: None   Collection Time: 12/18/18 12:18 PM  Result Value Ref Range Status   Adenovirus NOT DETECTED NOT DETECTED Final   Coronavirus 229E NOT DETECTED NOT DETECTED Final    Comment: (NOTE) The Coronavirus on the Respiratory Panel, DOES NOT test for the novel  Coronavirus (2019 nCoV)    Coronavirus HKU1 NOT DETECTED NOT DETECTED Final   Coronavirus NL63 NOT DETECTED NOT DETECTED Final   Coronavirus OC43 NOT DETECTED NOT DETECTED Final   Metapneumovirus NOT DETECTED NOT DETECTED Final   Rhinovirus / Enterovirus NOT DETECTED NOT DETECTED Final   Influenza A NOT DETECTED NOT DETECTED Final   Influenza B NOT DETECTED NOT DETECTED Final   Parainfluenza Virus 1 NOT DETECTED NOT DETECTED Final   Parainfluenza Virus 2 NOT DETECTED NOT DETECTED Final   Parainfluenza Virus 3 NOT DETECTED NOT DETECTED Final   Parainfluenza Virus 4 NOT DETECTED NOT DETECTED Final   Respiratory Syncytial Virus NOT DETECTED NOT DETECTED Final   Bordetella pertussis NOT DETECTED NOT DETECTED Final   Chlamydophila pneumoniae NOT  DETECTED NOT DETECTED Final   Mycoplasma pneumoniae NOT DETECTED NOT DETECTED Final    Comment: Performed at [facility], [address] [place], [place] [address]  Culture, blood (routine x 2)     Status: Abnormal   Collection Time: 12/18/18  1:00 PM  Result Value Ref Range Status   Specimen Description BLOOD LEFT HAND  Final   Special Requests   Final    BOTTLES DRAWN AEROBIC ONLY Blood Culture adequate volume   Culture  Setup Time   Final    GRAM POSITIVE COCCI IN CHAINS AEROBIC BOTTLE ONLY CRITICAL RESULT CALLED TO, READ BACK BY AND VERIFIED WITH: J.LADFORD,PHARMD AT 0450 ON 12/19/18 BY G.MCADOO    Culture (A)  Final    GROUP A STREP (S.PYOGENES) ISOLATED HEALTH DEPARTMENT NOTIFIED Performed at [facility], [address], [place], [place] [address]    Report Status 12/21/2018 FINAL  Final   Organism ID, Bacteria GROUP A STREP (S.PYOGENES) ISOLATED  Final      Susceptibility   Group a strep (s.pyogenes) isolated - MIC*    PENICILLIN <=0.06 SENSITIVE Sensitive     CEFTRIAXONE <=0.12 SENSITIVE Sensitive     ERYTHROMYCIN 2 RESISTANT Resistant     LEVOFLOXACIN <=0.25 SENSITIVE Sensitive     VANCOMYCIN <=0.12 SENSITIVE Sensitive     * GROUP A STREP (S.PYOGENES) ISOLATED  Blood Culture ID Panel (Reflexed)     Status: Abnormal   Collection Time: 12/18/18  1:00 PM  Result Value Ref Range Status   Enterococcus species NOT DETECTED NOT DETECTED Final   Listeria monocytogenes NOT DETECTED NOT DETECTED Final   Staphylococcus species NOT DETECTED NOT DETECTED Final   Staphylococcus aureus (BCID) NOT DETECTED NOT DETECTED Final   Streptococcus species DETECTED (A) NOT DETECTED Final    Comment: CRITICAL RESULT CALLED TO, READ BACK BY AND VERIFIED WITH: J.LADFORD,PHARMD AT 0450 ON 12/19/18 BY G.MCADOO    Streptococcus agalactiae NOT DETECTED NOT DETECTED Final   Streptococcus pneumoniae NOT DETECTED NOT DETECTED Final   Streptococcus pyogenes DETECTED (A) NOT DETECTED Final     Comment: CRITICAL RESULT CALLED TO, READ BACK BY AND VERIFIED WITH: J.LADFORD,PHARMD AT 0450 ON 12/19/18 BY G.MCADOO    Acinetobacter baumannii NOT DETECTED NOT DETECTED Final   Enterobacteriaceae species NOT DETECTED NOT DETECTED Final   Enterobacter cloacae complex NOT DETECTED NOT DETECTED Final   Escherichia coli NOT DETECTED NOT DETECTED Final   Klebsiella oxytoca NOT DETECTED NOT DETECTED Final   Klebsiella pneumoniae NOT DETECTED NOT DETECTED Final   Proteus species NOT DETECTED NOT DETECTED Final   Serratia marcescens NOT DETECTED NOT DETECTED Final   Haemophilus influenzae NOT DETECTED NOT DETECTED Final   Neisseria meningitidis NOT DETECTED NOT DETECTED Final   Pseudomonas aeruginosa NOT DETECTED NOT DETECTED Final   Candida albicans NOT DETECTED NOT DETECTED Final   Candida glabrata NOT DETECTED NOT DETECTED Final   Candida krusei NOT DETECTED NOT DETECTED Final   Candida parapsilosis NOT DETECTED NOT DETECTED Final   Candida tropicalis NOT DETECTED NOT DETECTED Final    Comment: Performed at [facility], [address] [place], [place] [address]  Culture, blood (routine x 2)     Status: Abnormal   Collection Time: 12/18/18  1:20 PM  Result Value Ref Range Status   Specimen Description BLOOD RIGHT HAND  Final   Special Requests   Final    BOTTLES DRAWN AEROBIC ONLY Blood Culture results may not be optimal due to an inadequate volume of blood received in culture bottles   Culture  Setup Time   Final    GRAM POSITIVE COCCI IN CHAINS AEROBIC BOTTLE ONLY CRITICAL VALUE NOTED.  VALUE IS CONSISTENT WITH PREVIOUSLY REPORTED AND CALLED VALUE.    Culture (A)  Final    GROUP A STREP (S.PYOGENES) ISOLATED SUSCEPTIBILITIES PERFORMED ON PREVIOUS CULTURE WITHIN THE LAST 5 DAYS. HEALTH DEPARTMENT NOTIFIED Performed at [facility], [address], [place], [place] [address]    Report Status 12/21/2018 FINAL  Final  Gastrointestinal Panel by PCR , Stool     Status:  None   Collection Time: 12/18/18  3:19 PM  Result Value Ref Range Status   Campylobacter species NOT DETECTED NOT DETECTED Final   Plesimonas shigelloides NOT DETECTED NOT DETECTED Final   Salmonella  species NOT DETECTED NOT DETECTED Final   Yersinia enterocolitica NOT DETECTED NOT DETECTED Final   Vibrio species NOT DETECTED NOT DETECTED Final   Vibrio cholerae NOT DETECTED NOT DETECTED Final   Enteroaggregative E coli (EAEC) NOT DETECTED NOT DETECTED Final   Enteropathogenic E coli (EPEC) NOT DETECTED NOT DETECTED Final   Enterotoxigenic E coli (ETEC) NOT DETECTED NOT DETECTED Final   Shiga like toxin producing E coli (STEC) NOT DETECTED NOT DETECTED Final   Shigella/Enteroinvasive E coli (EIEC) NOT DETECTED NOT DETECTED Final   Cryptosporidium NOT DETECTED NOT DETECTED Final   Cyclospora cayetanensis NOT DETECTED NOT DETECTED Final   Entamoeba histolytica NOT DETECTED NOT DETECTED Final   Giardia lamblia NOT DETECTED NOT DETECTED Final   Adenovirus F40/41 NOT DETECTED NOT DETECTED Final   Astrovirus NOT DETECTED NOT DETECTED Final   Norovirus GI/GII NOT DETECTED NOT DETECTED Final   Rotavirus A NOT DETECTED NOT DETECTED Final   Sapovirus (I, II, IV, and V) NOT DETECTED NOT DETECTED Final    Comment: Performed at [facility], [address] [place], [place] [address]  Culture, blood (routine x 2)     Status: None (Preliminary result)   Collection Time: 12/19/18  9:13 AM  Result Value Ref Range Status   Specimen Description BLOOD RIGHT ANTECUBITAL  Final   Special Requests AEROBIC BOTTLE ONLY Blood Culture adequate volume  Final   Culture   Final    NO GROWTH 4 DAYS Performed at [facility], [address], [place], [place] [address]    Report Status PENDING  Incomplete  Culture, blood (routine x 2)     Status: None (Preliminary result)   Collection Time: 12/19/18  9:26 AM  Result Value Ref Range Status   Specimen Description BLOOD RIGHT THUMB  Final    Special Requests AEROBIC BOTTLE ONLY Blood Culture adequate volume  Final   Culture   Final    NO GROWTH 4 DAYS Performed at [facility], [address] [place], [place] [address]    Report Status PENDING  Incomplete  MRSA PCR Screening     Status: None   Collection Time: 12/21/18  8:38 AM  Result Value Ref Range Status   MRSA by PCR NEGATIVE NEGATIVE Final    Comment:        The GeneXpert MRSA Assay (FDA approved for NASAL specimens only), is one component of a comprehensive MRSA colonization surveillance program. It is not intended to diagnose MRSA infection nor to guide or monitor treatment for MRSA infections. Performed at [facility], [address] [place], [place] [address]   C difficile quick scan w PCR reflex     Status: None   Collection Time: 12/24/18  1:54 AM  Result Value Ref Range Status   C Diff antigen NEGATIVE NEGATIVE Final   C Diff toxin NEGATIVE NEGATIVE Final   C Diff interpretation No C. difficile detected.  Final    Comment: Performed at [facility], [address], [place], [place] [address]     Scheduled Meds:  chlordiazePOXIDE  25 mg Oral TID   enoxaparin (LOVENOX) injection  40 mg Subcutaneous Q24H   folic acid  1 mg Oral Daily   gabapentin  300 mg Oral BID   levETIRAcetam  1,000 mg Oral BID   Living Better with Heart Failure Book   Does not apply Once   multivitamin with minerals  1 tablet Oral Daily   nicotine  21 mg Transdermal Daily   pantoprazole  40 mg Oral Daily   vitamin B-6  100 mg Oral Daily   QUEtiapine  50 mg Oral QHS   thiamine  100 mg Oral Daily     LOS: 7 days   Lonia Blood, MD [facility] Office  [phone] Pager - Text Page per Loretha Stapler  If 7PM-7AM, please contact night-coverage per Amion 12/24/2018, 9:41 AM

## 2018-12-24 NOTE — Progress Notes (Signed)
0400-Pt's BP is 174/100. Elink made aware. Lebatalol 10 mg ordered and given.  0600-Pt's BP is 177/92. Not time for PRN lebatalol, Elink made aware. Will continue to assess.

## 2018-12-24 NOTE — Progress Notes (Signed)
PT Cancellation Note  Patient Details Name: Darren Diaz MRN: [id number] DOB: 06-18-1986   Cancelled Treatment:    Reason Eval/Treat Not Completed: Patient not medically ready.  Sedated and not ready to participate. 12/24/2018  [place] Bing, PT Acute [facility] Services [phone]  (pager) [phone]  (office)   Darren Diaz 12/24/2018, 10:04 AM

## 2018-12-24 NOTE — Progress Notes (Addendum)
Patient ID: Darren Diaz, male   DOB: 05-08-1986, 33 y.o.   MRN: [id number]         [facility] for Infectious Disease  Date of Admission:  12/17/2018    Total days of antibiotics 7        Day 6 penicillin         ASSESSMENT: He has resolving right leg cellulitis complicated by group a streptococcal bacteremia.  He is having persistent fevers.  Repeat blood cultures are negative and there is no evidence of endocarditis by TTE.  He has some haziness in his lung bases on chest x-ray but I suspect that this is heart failure and not pneumonia.  His C. difficile screen was negative.  Plain x-ray of his right shoulder did not show any abnormalities but I am can certify possibility of septic arthritis.  If he continues to have fever and/or severe shoulder pain he will need further evaluation.  PLAN: 1. Continue penicillin 2. Consider orthopedics evaluation  Active Problems:   Alcohol withdrawal seizure, with unspecified complication (HCC)   Bacterial infection due to streptococcus, group A   Nonischemic cardiomyopathy (HCC)   Pressure injury of skin   Scheduled Meds: . carvedilol  6.25 mg Oral BID WC  . cloNIDine  0.1 mg Oral QID   Followed by  . [START ON 12/26/2018] cloNIDine  0.1 mg Oral BH-qamhs   Followed by  . [START ON 12/28/2018] cloNIDine  0.1 mg Oral QAC breakfast  . enoxaparin (LOVENOX) injection  40 mg Subcutaneous Q24H  . folic acid  1 mg Oral Daily  . gabapentin  300 mg Oral BID  . levETIRAcetam  1,000 mg Oral BID  . multivitamin with minerals  1 tablet Oral Daily  . nicotine  21 mg Transdermal Daily  . pantoprazole  40 mg Oral Daily  . potassium chloride  40 mEq Oral TID  . vitamin B-6  100 mg Oral Daily  . QUEtiapine  50 mg Oral QHS  . thiamine  100 mg Oral Daily   Continuous Infusions: . lactated ringers 25 mL/hr at 12/24/18 1424  . penicillin g continuous IV infusion 12 Million Units (12/24/18 0529)   PRN Meds:.acetaminophen, albuterol, bisacodyl,  dicyclomine, hydrALAZINE, loperamide, LORazepam, methocarbamol, [DISCONTINUED] ondansetron **OR** ondansetron (ZOFRAN) IV, oxyCODONE, phenol, sennosides   SUBJECTIVE: He has difficulty providing a full history but it sounds like he has had acute on chronic right shoulder pain for the past 2 weeks.  Review of Systems: Review of Systems  Unable to perform ROS: Mental acuity    No Known Allergies  OBJECTIVE: Vitals:   12/24/18 0600 12/24/18 0900 12/24/18 0930 12/24/18 1000  BP: (!) 177/92  (!) 175/101 (!) 181/96  Pulse: (!) 104 (!) 109 (!) 107 (!) 107  Resp: (!) 35 (!) 30 (!) 32 (!) 30  Temp:      TempSrc:      SpO2: 91% 96% 97% 99%  Weight:      Height:       Body mass index is 31.28 kg/m.  Physical Exam Constitutional:      Comments: He is very lethargic.  He had pain medication recently.  HENT:     Mouth/Throat:     Pharynx: No oropharyngeal exudate.  Eyes:     Conjunctiva/sclera: Conjunctivae normal.  Neck:     Musculoskeletal: Neck supple.  Cardiovascular:     Rate and Rhythm: Normal rate and regular rhythm.     Heart sounds: No murmur.  Pulmonary:  Effort: Pulmonary effort is normal.     Breath sounds: Normal breath sounds.  Abdominal:     Palpations: Abdomen is soft.     Tenderness: There is no abdominal tenderness.     Comments: Still having diarrhea.  Musculoskeletal:        General: Swelling and tenderness present.     Comments: His right shoulder is extremely sensitive with any palpation and range of motion.  His edema of both arms.  Skin:    Comments: Right lower leg cellulitis is resolving.     Lab Results Lab Results  Component Value Date   WBC 7.2 12/24/2018   HGB 10.7 (L) 12/24/2018   HCT 30.7 (L) 12/24/2018   MCV 99.0 12/24/2018   PLT 218 12/24/2018    Lab Results  Component Value Date   CREATININE 1.46 (H) 12/24/2018   BUN 19 12/24/2018   NA 137 12/24/2018   K 3.1 (L) 12/24/2018   CL 107 12/24/2018   CO2 19 (L) 12/24/2018     Lab Results  Component Value Date   ALT 285 (H) 12/21/2018   AST 158 (H) 12/21/2018   ALKPHOS 45 12/21/2018   BILITOT 2.2 (H) 12/21/2018     Microbiology: Recent Results (from the past 240 hour(s))  Respiratory Panel by PCR     Status: None   Collection Time: 12/18/18 12:18 PM  Result Value Ref Range Status   Adenovirus NOT DETECTED NOT DETECTED Final   Coronavirus 229E NOT DETECTED NOT DETECTED Final    Comment: (NOTE) The Coronavirus on the Respiratory Panel, DOES NOT test for the novel  Coronavirus (2019 nCoV)    Coronavirus HKU1 NOT DETECTED NOT DETECTED Final   Coronavirus NL63 NOT DETECTED NOT DETECTED Final   Coronavirus OC43 NOT DETECTED NOT DETECTED Final   Metapneumovirus NOT DETECTED NOT DETECTED Final   Rhinovirus / Enterovirus NOT DETECTED NOT DETECTED Final   Influenza A NOT DETECTED NOT DETECTED Final   Influenza B NOT DETECTED NOT DETECTED Final   Parainfluenza Virus 1 NOT DETECTED NOT DETECTED Final   Parainfluenza Virus 2 NOT DETECTED NOT DETECTED Final   Parainfluenza Virus 3 NOT DETECTED NOT DETECTED Final   Parainfluenza Virus 4 NOT DETECTED NOT DETECTED Final   Respiratory Syncytial Virus NOT DETECTED NOT DETECTED Final   Bordetella pertussis NOT DETECTED NOT DETECTED Final   Chlamydophila pneumoniae NOT DETECTED NOT DETECTED Final   Mycoplasma pneumoniae NOT DETECTED NOT DETECTED Final    Comment: Performed at [facility], [address] [place], [place] [address]  Culture, blood (routine x 2)     Status: Abnormal   Collection Time: 12/18/18  1:00 PM  Result Value Ref Range Status   Specimen Description BLOOD LEFT HAND  Final   Special Requests   Final    BOTTLES DRAWN AEROBIC ONLY Blood Culture adequate volume   Culture  Setup Time   Final    GRAM POSITIVE COCCI IN CHAINS AEROBIC BOTTLE ONLY CRITICAL RESULT CALLED TO, READ BACK BY AND VERIFIED WITH: J.LADFORD,PHARMD AT 0450 ON 12/19/18 BY G.MCADOO    Culture (A)  Final    GROUP A STREP  (S.PYOGENES) ISOLATED HEALTH DEPARTMENT NOTIFIED Performed at [facility], [address] [place], [place] [address]    Report Status 12/21/2018 FINAL  Final   Organism ID, Bacteria GROUP A STREP (S.PYOGENES) ISOLATED  Final      Susceptibility   Group a strep (s.pyogenes) isolated - MIC*    PENICILLIN <=  0.06 SENSITIVE Sensitive     CEFTRIAXONE <=0.12 SENSITIVE Sensitive     ERYTHROMYCIN 2 RESISTANT Resistant     LEVOFLOXACIN <=0.25 SENSITIVE Sensitive     VANCOMYCIN <=0.12 SENSITIVE Sensitive     * GROUP A STREP (S.PYOGENES) ISOLATED  Blood Culture ID Panel (Reflexed)     Status: Abnormal   Collection Time: 12/18/18  1:00 PM  Result Value Ref Range Status   Enterococcus species NOT DETECTED NOT DETECTED Final   Listeria monocytogenes NOT DETECTED NOT DETECTED Final   Staphylococcus species NOT DETECTED NOT DETECTED Final   Staphylococcus aureus (BCID) NOT DETECTED NOT DETECTED Final   Streptococcus species DETECTED (A) NOT DETECTED Final    Comment: CRITICAL RESULT CALLED TO, READ BACK BY AND VERIFIED WITH: J.LADFORD,PHARMD AT 0450 ON 12/19/18 BY G.MCADOO    Streptococcus agalactiae NOT DETECTED NOT DETECTED Final   Streptococcus pneumoniae NOT DETECTED NOT DETECTED Final   Streptococcus pyogenes DETECTED (A) NOT DETECTED Final    Comment: CRITICAL RESULT CALLED TO, READ BACK BY AND VERIFIED WITH: J.LADFORD,PHARMD AT 0450 ON 12/19/18 BY G.MCADOO    Acinetobacter baumannii NOT DETECTED NOT DETECTED Final   Enterobacteriaceae species NOT DETECTED NOT DETECTED Final   Enterobacter cloacae complex NOT DETECTED NOT DETECTED Final   Escherichia coli NOT DETECTED NOT DETECTED Final   Klebsiella oxytoca NOT DETECTED NOT DETECTED Final   Klebsiella pneumoniae NOT DETECTED NOT DETECTED Final   Proteus species NOT DETECTED NOT DETECTED Final   Serratia marcescens NOT DETECTED NOT DETECTED Final   Haemophilus influenzae NOT DETECTED NOT DETECTED Final   Neisseria meningitidis NOT  DETECTED NOT DETECTED Final   Pseudomonas aeruginosa NOT DETECTED NOT DETECTED Final   Candida albicans NOT DETECTED NOT DETECTED Final   Candida glabrata NOT DETECTED NOT DETECTED Final   Candida krusei NOT DETECTED NOT DETECTED Final   Candida parapsilosis NOT DETECTED NOT DETECTED Final   Candida tropicalis NOT DETECTED NOT DETECTED Final    Comment: Performed at [facility], [address], [place], [place] [address]  Culture, blood (routine x 2)     Status: Abnormal   Collection Time: 12/18/18  1:20 PM  Result Value Ref Range Status   Specimen Description BLOOD RIGHT HAND  Final   Special Requests   Final    BOTTLES DRAWN AEROBIC ONLY Blood Culture results may not be optimal due to an inadequate volume of blood received in culture bottles   Culture  Setup Time   Final    GRAM POSITIVE COCCI IN CHAINS AEROBIC BOTTLE ONLY CRITICAL VALUE NOTED.  VALUE IS CONSISTENT WITH PREVIOUSLY REPORTED AND CALLED VALUE.    Culture (A)  Final    GROUP A STREP (S.PYOGENES) ISOLATED SUSCEPTIBILITIES PERFORMED ON PREVIOUS CULTURE WITHIN THE LAST 5 DAYS. HEALTH DEPARTMENT NOTIFIED Performed at [facility], [address], [place], [place] [address]    Report Status 12/21/2018 FINAL  Final  Gastrointestinal Panel by PCR , Stool     Status: None   Collection Time: 12/18/18  3:19 PM  Result Value Ref Range Status   Campylobacter species NOT DETECTED NOT DETECTED Final   Plesimonas shigelloides NOT DETECTED NOT DETECTED Final   Salmonella species NOT DETECTED NOT DETECTED Final   Yersinia enterocolitica NOT DETECTED NOT DETECTED Final   Vibrio species NOT DETECTED NOT DETECTED Final   Vibrio cholerae NOT DETECTED NOT DETECTED Final   Enteroaggregative E coli (EAEC) NOT DETECTED NOT DETECTED Final   Enteropathogenic E coli (EPEC) NOT  DETECTED NOT DETECTED Final   Enterotoxigenic E coli (ETEC) NOT DETECTED NOT DETECTED Final   Shiga like toxin producing E coli (STEC) NOT DETECTED NOT  DETECTED Final   Shigella/Enteroinvasive E coli (EIEC) NOT DETECTED NOT DETECTED Final   Cryptosporidium NOT DETECTED NOT DETECTED Final   Cyclospora cayetanensis NOT DETECTED NOT DETECTED Final   Entamoeba histolytica NOT DETECTED NOT DETECTED Final   Giardia lamblia NOT DETECTED NOT DETECTED Final   Adenovirus F40/41 NOT DETECTED NOT DETECTED Final   Astrovirus NOT DETECTED NOT DETECTED Final   Norovirus GI/GII NOT DETECTED NOT DETECTED Final   Rotavirus A NOT DETECTED NOT DETECTED Final   Sapovirus (I, II, IV, and V) NOT DETECTED NOT DETECTED Final    Comment: Performed at [facility], [address] [place], [place] [address]  Culture, blood (routine x 2)     Status: None (Preliminary result)   Collection Time: 12/19/18  9:13 AM  Result Value Ref Range Status   Specimen Description BLOOD RIGHT ANTECUBITAL  Final   Special Requests AEROBIC BOTTLE ONLY Blood Culture adequate volume  Final   Culture   Final    NO GROWTH 4 DAYS Performed at [facility], [address] [place], [place] [address]    Report Status PENDING  Incomplete  Culture, blood (routine x 2)     Status: None (Preliminary result)   Collection Time: 12/19/18  9:26 AM  Result Value Ref Range Status   Specimen Description BLOOD RIGHT THUMB  Final   Special Requests AEROBIC BOTTLE ONLY Blood Culture adequate volume  Final   Culture   Final    NO GROWTH 4 DAYS Performed at [facility], [address], [place], [place] [address]    Report Status PENDING  Incomplete  MRSA PCR Screening     Status: None   Collection Time: 12/21/18  8:38 AM  Result Value Ref Range Status   MRSA by PCR NEGATIVE NEGATIVE Final    Comment:        The GeneXpert MRSA Assay (FDA approved for NASAL specimens only), is one component of a comprehensive MRSA colonization surveillance program. It is not intended to diagnose MRSA infection nor to guide or monitor treatment for MRSA infections. Performed at [facility], [address] [place], [place] [address]   C difficile quick scan w PCR reflex     Status: None   Collection Time: 12/24/18  1:54 AM  Result Value Ref Range Status   C Diff antigen NEGATIVE NEGATIVE Final   C Diff toxin NEGATIVE NEGATIVE Final   C Diff interpretation No C. difficile detected.  Final    Comment: Performed at [facility], [address] [place], [place] [address]    Cliffton Asters, MD [facility] for Infectious Disease [facility] [phone] pager   [phone] cell 12/24/2018, 2:41 PM

## 2018-12-24 NOTE — Progress Notes (Addendum)
eLink Physician-Brief Progress Note Patient Name: Darren Diaz DOB: Sep 25, 1986 MRN: [id number]   Date of Service  12/24/2018  HPI/Events of Note  Pt has been complaining of shoulder pain and was getting fentanyl boluses. He has been extubated since.   eICU Interventions  Give oxycodone - acetaminophen prn.      Intervention Category Intermediate Interventions: Pain - evaluation and management  Larinda Buttery 12/24/2018, 12:00 AM   12:55 AM  Informed by bedside ICU RN that patient has been having loose stools and documented atleast 3.  Pt also complained of some abdominal pain.  Plan> Check c diff   4:38 AM Informed by bedside RN that pt has been hypertensive.  SBP 180s.   On video assessment, pt is sleeping comfortably. He is not in distress.   He is tachycardic in the 100s.   Plan> Labetalol IV ordered.  6:28 AM  BP remains elevated despite labetalol.  Pt is not in pain.  Plan> Give hydralazine IV.  Consider starting PO antihypertensives in the AM.

## 2018-12-24 NOTE — Progress Notes (Signed)
Progress Note  Patient Name: Darren Diaz Date of Encounter: 12/24/2018  Primary Cardiologist:   No primary care provider on file.   Subjective   Complains of right shoulder pain.  Denies SOB.   Inpatient Medications    Scheduled Meds:  chlordiazePOXIDE  25 mg Oral TID   enoxaparin (LOVENOX) injection  40 mg Subcutaneous Q24H   folic acid  1 mg Oral Daily   gabapentin  300 mg Oral BID   levETIRAcetam  1,000 mg Oral BID   multivitamin with minerals  1 tablet Oral Daily   nicotine  21 mg Transdermal Daily   pantoprazole  40 mg Oral Daily   vitamin B-6  100 mg Oral Daily   QUEtiapine  50 mg Oral QHS   thiamine  100 mg Oral Daily   Continuous Infusions:  lactated ringers 25 mL/hr at 12/23/18 2100   penicillin g continuous IV infusion 12 Million Units (12/24/18 0529)   PRN Meds: acetaminophen, albuterol, bisacodyl, labetalol, loperamide, LORazepam, midazolam, ondansetron **OR** ondansetron (ZOFRAN) IV, oxyCODONE-acetaminophen, phenol, sennosides   Vital Signs    Vitals:   12/24/18 0300 12/24/18 0400 12/24/18 0500 12/24/18 0600  BP: (!) 161/116 (!) 174/100 (!) 167/98 (!) 177/92  Pulse: (!) 103 100 (!) 101 (!) 104  Resp: (!) 33 (!) 28 (!) 31 (!) 35  Temp:      TempSrc:      SpO2: 93% 94% 93% 91%  Weight:   98.9 kg   Height:        Intake/Output Summary (Last 24 hours) at 12/24/2018 0959 Last data filed at 12/24/2018 0600 Gross per 24 hour  Intake 2314.66 ml  Output 1455 ml  Net 859.66 ml   Filed Weights   12/17/18 2300 12/23/18 0500 12/24/18 0500  Weight: 92.8 kg 97.9 kg 98.9 kg    Telemetry    Sinus tachycardia - Personally Reviewed  ECG    NA - Personally Reviewed  Physical Exam   GEN: No acute distress.   Neck: No  JVD Cardiac: RRR, no murmurs, rubs, or gallops.  Respiratory: Clear  to auscultation bilaterally. GI: Soft, nontender, non-distended  MS: No  edema; No deformity. Neuro:  Nonfocal  Psych: Normal affect   Labs      Chemistry Recent Labs  Lab 12/19/18 0356 12/20/18 0332 12/21/18 0335  12/22/18 0520 12/23/18 0234 12/24/18 0616  NA 136 137 140   < > 139 140 137  K 3.2* 3.4* 4.3   < > 4.4 4.4 3.1*  CL 103 110 114*  --  113* 111 107  CO2 22 21* 20*  --  21* 22 19*  GLUCOSE 96 107* 93  --  109* 122* 105*  BUN 10 19 18   --  26* 25* 19  CREATININE 0.99 1.53* 1.55*  --  1.74* 1.54* 1.46*  CALCIUM 8.0* 8.0* 8.8*  --  8.3* 8.3* 8.4*  PROT 6.5 5.6* 6.1*  --   --   --   --   ALBUMIN 3.2* 2.7* 2.6*  --   --   --   --   AST 2,241* 549* 158*  --   --   --   --   ALT 750* 512* 285*  --   --   --   --   ALKPHOS 23* 30* 45  --   --   --   --   BILITOT 1.6* 1.6* 2.2*  --   --   --   --   GFRNONAA >  60 58* 58*  --  50* 58* >60  GFRAA >60 >60 >60  --  58* >60 >60  ANIONGAP 11 6 6   --  5 7 11    < > = values in this interval not displayed.     Hematology Recent Labs  Lab 12/22/18 0520 12/23/18 0234 12/24/18 0616  WBC 6.0 5.5 7.2  RBC 3.35* 2.87* 3.10*  HGB 11.4* 9.8* 10.7*  HCT 35.2* 29.8* 30.7*  MCV 105.1* 103.8* 99.0  MCH 34.0 34.1* 34.5*  MCHC 32.4 32.9 34.9  RDW 14.0 13.3 12.6  PLT 113* 145* 218    Cardiac EnzymesNo results for input(s): TROPONINI in the last 168 hours. No results for input(s): TROPIPOC in the last 168 hours.   BNP Recent Labs  Lab 12/21/18 0738  BNP 3,126.8*     DDimer No results for input(s): DDIMER in the last 168 hours.   Radiology    Dg Chest Port 1 View  Result Date: 12/23/2018 CLINICAL DATA:  Endotracheal tube EXAM: PORTABLE CHEST 1 VIEW COMPARISON:  Yesterday FINDINGS: Endotracheal tube tip is just below the clavicular heads, improved. Orogastric tube at least reaches the stomach. Cardiopericardial enlargement and lower lobe opacities with left-sided air bronchogram. Layering pleural fluid on the right at least. No pneumothorax is seen. IMPRESSION: 1. Improved endotracheal tube positioning. 2. Atelectasis or pneumonia at the bases with layering pleural fluid.  Electronically Signed   By: Marnee Spring M.D.   On: 12/23/2018 06:43    Cardiac Studies   ECHO:     1. The left ventricle has a visually estimated ejection fraction of of 35%. The cavity size was mildly dilated. Left ventricular diastolic Doppler parameters are consistent with impaired relaxation. Left ventricular diffuse hypokinesis.  2. The right ventricle has normal systolic function. The cavity was normal. There is no increase in right ventricular wall thickness.  3. No evidence of mitral valve stenosis. Trivial mitral regurgitation.  4. The aortic valve is tricuspid no stenosis of the aortic valve.  5. The aortic root and ascending aorta are normal in size and structure.  6. The inferior vena cava was dilated in size with <50% respiratory variability. No complete TR doppler jet so unable to estimate PA systolic pressure.  Patient Profile     33 y.o. 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 Jerral Ralph.  Hx of PDA repair at age 64. Per parent, it was minor. No cardiac issue since then.    Assessment & Plan    ACUTE SYSTOLIC HF:   Presumed secondary to ETOH.  Positive 2.5 liters since admission.   Start Coreg.  Will likely be able to start Entresto in the next few days.     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    For questions or updates, please contact [facility] Please consult [email or web address] for contact info under Cardiology/STEMI.   Signed, Rollene Rotunda, MD  12/24/2018, 9:59 AM

## 2018-12-25 ENCOUNTER — Inpatient Hospital Stay (HOSPITAL_COMMUNITY): Payer: BLUE CROSS/BLUE SHIELD

## 2018-12-25 LAB — RETICULOCYTES
Immature Retic Fract: 15.2 % (ref 2.3–15.9)
RBC.: 3.13 MIL/uL — AB (ref 4.22–5.81)
Retic Count, Absolute: 48.8 10*3/uL (ref 19.0–186.0)
Retic Ct Pct: 1.6 % (ref 0.4–3.1)

## 2018-12-25 LAB — COMPREHENSIVE METABOLIC PANEL
ALT: 58 U/L — ABNORMAL HIGH (ref 0–44)
AST: 38 U/L (ref 15–41)
Albumin: 2.3 g/dL — ABNORMAL LOW (ref 3.5–5.0)
Alkaline Phosphatase: 49 U/L (ref 38–126)
Anion gap: 8 (ref 5–15)
BILIRUBIN TOTAL: 1.5 mg/dL — AB (ref 0.3–1.2)
BUN: 17 mg/dL (ref 6–20)
CO2: 16 mmol/L — ABNORMAL LOW (ref 22–32)
Calcium: 8 mg/dL — ABNORMAL LOW (ref 8.9–10.3)
Chloride: 113 mmol/L — ABNORMAL HIGH (ref 98–111)
Creatinine, Ser: 1.38 mg/dL — ABNORMAL HIGH (ref 0.61–1.24)
Glucose, Bld: 92 mg/dL (ref 70–99)
Potassium: 4.7 mmol/L (ref 3.5–5.1)
Sodium: 137 mmol/L (ref 135–145)
Total Protein: 6.1 g/dL — ABNORMAL LOW (ref 6.5–8.1)

## 2018-12-25 LAB — CBC
HCT: 31.3 % — ABNORMAL LOW (ref 39.0–52.0)
Hemoglobin: 10.7 g/dL — ABNORMAL LOW (ref 13.0–17.0)
MCH: 34.2 pg — ABNORMAL HIGH (ref 26.0–34.0)
MCHC: 34.2 g/dL (ref 30.0–36.0)
MCV: 100 fL (ref 80.0–100.0)
Platelets: 342 10*3/uL (ref 150–400)
RBC: 3.13 MIL/uL — ABNORMAL LOW (ref 4.22–5.81)
RDW: 12.7 % (ref 11.5–15.5)
WBC: 7.4 10*3/uL (ref 4.0–10.5)
nRBC: 0 % (ref 0.0–0.2)

## 2018-12-25 LAB — VITAMIN B12: Vitamin B-12: 807 pg/mL (ref 180–914)

## 2018-12-25 LAB — IRON AND TIBC
Iron: 23 ug/dL — ABNORMAL LOW (ref 45–182)
Saturation Ratios: 14 % — ABNORMAL LOW (ref 17.9–39.5)
TIBC: 164 ug/dL — ABNORMAL LOW (ref 250–450)
UIBC: 141 ug/dL

## 2018-12-25 LAB — FOLATE: Folate: 27.4 ng/mL (ref >5.9)

## 2018-12-25 LAB — TSH: TSH: 4.715 u[IU]/mL — ABNORMAL HIGH (ref 0.350–4.500)

## 2018-12-25 LAB — MAGNESIUM: Magnesium: 2.1 mg/dL (ref 1.7–2.4)

## 2018-12-25 LAB — PHOSPHORUS: Phosphorus: 2.8 mg/dL (ref 2.5–4.6)

## 2018-12-25 LAB — FERRITIN: Ferritin: 1330 ng/mL — ABNORMAL HIGH (ref 24–336)

## 2018-12-25 MED ORDER — SACUBITRIL-VALSARTAN 24-26 MG PO TABS
1.0000 | ORAL_TABLET | Freq: Two times a day (BID) | ORAL | Status: DC
  Administered 2018-12-25 – 2018-12-29 (×10): 1 via ORAL
  Filled 2018-12-25 (×11): qty 1

## 2018-12-25 MED ORDER — WHITE PETROLATUM EX OINT
TOPICAL_OINTMENT | CUTANEOUS | Status: AC
  Administered 2018-12-25: 05:00:00
  Filled 2018-12-25: qty 28.35

## 2018-12-25 MED ORDER — HYDRALAZINE HCL 20 MG/ML IJ SOLN
20.0000 mg | INTRAMUSCULAR | Status: DC | PRN
  Administered 2018-12-25 – 2018-12-26 (×3): 20 mg via INTRAVENOUS
  Filled 2018-12-25 (×2): qty 1

## 2018-12-25 MED ORDER — HYDRALAZINE HCL 20 MG/ML IJ SOLN: 10.0000 mg | INTRAMUSCULAR | Status: DC | PRN

## 2018-12-25 MED ORDER — HYDRALAZINE HCL 20 MG/ML IJ SOLN
10.0000 mg | Freq: Once | INTRAMUSCULAR | Status: AC
  Administered 2018-12-25: 10 mg via INTRAVENOUS
  Filled 2018-12-25: qty 1

## 2018-12-25 MED ORDER — CARVEDILOL 12.5 MG PO TABS
12.5000 mg | ORAL_TABLET | Freq: Two times a day (BID) | ORAL | Status: DC
  Administered 2018-12-26 – 2019-01-02 (×14): 12.5 mg via ORAL
  Filled 2018-12-25 (×16): qty 1

## 2018-12-25 MED ORDER — ISOSORBIDE DINITRATE 5 MG PO TABS
5.0000 mg | ORAL_TABLET | Freq: Three times a day (TID) | ORAL | Status: DC
  Administered 2018-12-25: 5 mg via ORAL
  Filled 2018-12-25 (×2): qty 1

## 2018-12-25 MED ORDER — ISOSORBIDE DINITRATE 10 MG PO TABS
10.0000 mg | ORAL_TABLET | Freq: Three times a day (TID) | ORAL | Status: DC
  Filled 2018-12-25: qty 1

## 2018-12-25 MED ORDER — NITROGLYCERIN IN D5W 200-5 MCG/ML-% IV SOLN
0.0000 ug/min | INTRAVENOUS | Status: DC
  Administered 2018-12-25: 3 ug/min via INTRAVENOUS
  Filled 2018-12-25: qty 250

## 2018-12-25 NOTE — Progress Notes (Signed)
   12/25/18 1845  Vitals  BP (!) 204/86  MAP (mmHg) 114  Pulse Rate (!) 109  ECG Heart Rate (!) 109  Resp (!) 21  Oxygen Therapy  SpO2 96 %  provider notified, orders written see MAR

## 2018-12-25 NOTE — Progress Notes (Signed)
Right upper extremity venous duplex has been completed. Preliminary results can be found in CV Proc through chart review.   12/25/18 1:41 PM Olen Cordial RVT

## 2018-12-25 NOTE — Progress Notes (Signed)
[facility] TEAM 1 - Stepdown/ICU TEAM  Jadrien Narine  ZOX:[id number] DOB: May 20, 1986 DOA: 12/17/2018 PCP: Patient, No Pcp Per    Brief Narrative:  32yo w/ a Hx of EtOH abuse and seizure who presented 12/17/18 after a friend heard him fall, and discovered he was seizing. Following admission he was found to have Group A Strep bacteremia, and required ICU transfer for severe EtOH withdrawal.   Significant Events: 3/7 admit s/p seizure 3/9 Group A Strep RLE cellulitis  3/9 TTE - EF 35% w/ diffuse hypokinesis  3/11 tx to ICU - worsening EtOH withdrawal, acute respiratory failure, sepsis - intubated 3/13 extubated 3/14 TRH resumed care   Subjective: The pt has developed an area of erythema of the R forearm/antecubital fossa over the last 24hrs. He remains agitated and confused, though alert. He now tells me most of his pain is in the R hip and down the R leg. He denies sob, cp, or HA.  Assessment & Plan:  Sepsis due to Streptococcus pyogenes R LE cellulitis w/  bacteremia Care as suggested by ID - cont current abx for today  R antecubital/forearm erythema Cellulitis v/s contact dermatitis - cont current abx - avoid use of cuff or IV in this arm - follow   Acute Respiratory Failure with hypoxia Due to EtOH withdrawal + sepsis - required intubation - extubated 3/13 -respiratory status stable at this time w/ pt on minimal O2 support   Acute Encephalopathy  Multifactorial: sepsis, EtOH withdrawal, meds, seizure - CT head negative for acute findings - EEG no seizure activity - mental status not yet improved to a significant extent   Alcohol Abuse with withdrawal Initially required Precedex drip w/ severe agitation - transitioned to clonidine withdrawal protocol - monitor closely with high risk for recurrent seizure activity/agitation  Seizure disorder Non-compliant with outpatient Keppra - no evidence of recurrent seizure   Newly diagnosed Systolic heart failure EF 35% on TTE - Cards  following - to add Entresto today - mildly volume overloaded on exam - net negative 1700 last 24hrs, but remains +500 since admit   Filed Weights   12/23/18 0500 12/24/18 0500 12/25/18 0435  Weight: 97.9 kg 98.9 kg 98.8 kg      Uncontrolled HTN BP not yet significantly improved - adjust meds again today - agitation and confusion likely contributing   Acute Kidney Injury Renal US unremarkable - crt slowly improving - monitor trend  Hypokalemia Supplemented to goal of 4.0  Hypomagnesemia   Supplemented to goal of 2.0  Acute Liver injury with transaminitis  down-trending - likely shock liver - RUQ US unremarkable for hepatic findings - LFTs now essentially normalized   Tobacco use disorder  DVT prophylaxis: lovenox  Code Status: FULL CODE Family Communication: Spoke with father at bedside Disposition Plan: Monitor in current bed due to high likelihood of recurrent severe withdrawal symptoms  Consultants:  Cardiology ID  PCCM  Antimicrobials:  PCN G 3/9 >  Objective: Blood pressure (!) 144/106, pulse (!) 104, temperature 99.1 F (37.3 C), temperature source Oral, resp. rate (!) 26, height  (1.778 m), weight 98.8 kg, SpO2 97 %.  Intake/Output Summary (Last 24 hours) at 12/25/2018 1056 Last data filed at 12/25/2018 0900 Gross per 24 hour  Intake 1486.32 ml  Output 3735 ml  Net -2248.68 ml   Filed Weights   12/23/18 0500 12/24/18 0500 12/25/18 0435  Weight: 97.9 kg 98.9 kg 98.8 kg    Examination: General: No acute respiratory distress - more  alert but confused   Lungs: Clear to auscultation bilaterally - poor air movement B bases Cardiovascular: Regular rate and rhythm without murmur  Abdomen: Nontender, nondistended, soft, bowel sounds positive, no rebound, no ascites, no appreciable mass Extremities: 1+ pitting edema bilateral lower extremities - erythema of right lower extremity has resolved - new patchy erythema of R UE  CBC: Recent Labs  Lab 12/23/18  0234 12/24/18 0616 12/25/18 0803  WBC 5.5 7.2 7.4  HGB 9.8* 10.7* 10.7*  HCT 29.8* 30.7* 31.3*  MCV 103.8* 99.0 100.0  PLT 145* 218 342   Basic Metabolic Panel: Recent Labs  Lab 12/23/18 0234 12/24/18 0616 12/25/18 0428  NA 140 137 137  K 4.4 3.1* 4.7  CL 111 107 113*  CO2 22 19* 16*  GLUCOSE 122* 105* 92  BUN 25* 19 17  CREATININE 1.54* 1.46* 1.38*  CALCIUM 8.3* 8.4* 8.0*  MG 1.6* 1.6* 2.1  PHOS 2.1* 3.1 2.8   GFR: Estimated Creatinine Clearance: 88.9 mL/min (A) (by C-G formula based on SCr of 1.38 mg/dL (H)).  Liver Function Tests: Recent Labs  Lab 12/19/18 0356 12/20/18 0332 12/21/18 0335 12/25/18 0428  AST 2,241* 549* 158* 38  ALT 750* 512* 285* 58*  ALKPHOS 23* 30* 45 49  BILITOT 1.6* 1.6* 2.2* 1.5*  PROT 6.5 5.6* 6.1* 6.1*  ALBUMIN 3.2* 2.7* 2.6* 2.3*    Recent Labs  Lab 12/19/18 1128 12/20/18 0332  AMMONIA 28 34    Coagulation Profile: Recent Labs  Lab 12/19/18 0702 12/20/18 0332  INR 1.5* 1.2     Recent Results (from the past 240 hour(s))  Respiratory Panel by PCR     Status: None   Collection Time: 12/18/18 12:18 PM  Result Value Ref Range Status   Adenovirus NOT DETECTED NOT DETECTED Final   Coronavirus 229E NOT DETECTED NOT DETECTED Final    Comment: (NOTE) The Coronavirus on the Respiratory Panel, DOES NOT test for the novel  Coronavirus (2019 nCoV)    Coronavirus HKU1 NOT DETECTED NOT DETECTED Final   Coronavirus NL63 NOT DETECTED NOT DETECTED Final   Coronavirus OC43 NOT DETECTED NOT DETECTED Final   Metapneumovirus NOT DETECTED NOT DETECTED Final   Rhinovirus / Enterovirus NOT DETECTED NOT DETECTED Final   Influenza A NOT DETECTED NOT DETECTED Final   Influenza B NOT DETECTED NOT DETECTED Final   Parainfluenza Virus 1 NOT DETECTED NOT DETECTED Final   Parainfluenza Virus 2 NOT DETECTED NOT DETECTED Final   Parainfluenza Virus 3 NOT DETECTED NOT DETECTED Final   Parainfluenza Virus 4 NOT DETECTED NOT DETECTED Final    Respiratory Syncytial Virus NOT DETECTED NOT DETECTED Final   Bordetella pertussis NOT DETECTED NOT DETECTED Final   Chlamydophila pneumoniae NOT DETECTED NOT DETECTED Final   Mycoplasma pneumoniae NOT DETECTED NOT DETECTED Final    Comment: Performed at [facility], [address], [place], [place] [address]  Culture, blood (routine x 2)     Status: Abnormal   Collection Time: 12/18/18  1:00 PM  Result Value Ref Range Status   Specimen Description BLOOD LEFT HAND  Final   Special Requests   Final    BOTTLES DRAWN AEROBIC ONLY Blood Culture adequate volume   Culture  Setup Time   Final    GRAM POSITIVE COCCI IN CHAINS AEROBIC BOTTLE ONLY CRITICAL RESULT CALLED TO, READ BACK BY AND VERIFIED WITH: J.LADFORD,PHARMD AT 0450 ON 12/19/18 BY G.MCADOO    Culture (A)  Final    GROUP A  STREP (S.PYOGENES) ISOLATED HEALTH DEPARTMENT NOTIFIED Performed at [facility], [address] [place], [place] [address]    Report Status 12/21/2018 FINAL  Final   Organism ID, Bacteria GROUP A STREP (S.PYOGENES) ISOLATED  Final      Susceptibility   Group a strep (s.pyogenes) isolated - MIC*    PENICILLIN <=0.06 SENSITIVE Sensitive     CEFTRIAXONE <=0.12 SENSITIVE Sensitive     ERYTHROMYCIN 2 RESISTANT Resistant     LEVOFLOXACIN <=0.25 SENSITIVE Sensitive     VANCOMYCIN <=0.12 SENSITIVE Sensitive     * GROUP A STREP (S.PYOGENES) ISOLATED  Blood Culture ID Panel (Reflexed)     Status: Abnormal   Collection Time: 12/18/18  1:00 PM  Result Value Ref Range Status   Enterococcus species NOT DETECTED NOT DETECTED Final   Listeria monocytogenes NOT DETECTED NOT DETECTED Final   Staphylococcus species NOT DETECTED NOT DETECTED Final   Staphylococcus aureus (BCID) NOT DETECTED NOT DETECTED Final   Streptococcus species DETECTED (A) NOT DETECTED Final    Comment: CRITICAL RESULT CALLED TO, READ BACK BY AND VERIFIED WITH: J.LADFORD,PHARMD AT 0450 ON 12/19/18 BY G.MCADOO    Streptococcus agalactiae  NOT DETECTED NOT DETECTED Final   Streptococcus pneumoniae NOT DETECTED NOT DETECTED Final   Streptococcus pyogenes DETECTED (A) NOT DETECTED Final    Comment: CRITICAL RESULT CALLED TO, READ BACK BY AND VERIFIED WITH: J.LADFORD,PHARMD AT 0450 ON 12/19/18 BY G.MCADOO    Acinetobacter baumannii NOT DETECTED NOT DETECTED Final   Enterobacteriaceae species NOT DETECTED NOT DETECTED Final   Enterobacter cloacae complex NOT DETECTED NOT DETECTED Final   Escherichia coli NOT DETECTED NOT DETECTED Final   Klebsiella oxytoca NOT DETECTED NOT DETECTED Final   Klebsiella pneumoniae NOT DETECTED NOT DETECTED Final   Proteus species NOT DETECTED NOT DETECTED Final   Serratia marcescens NOT DETECTED NOT DETECTED Final   Haemophilus influenzae NOT DETECTED NOT DETECTED Final   Neisseria meningitidis NOT DETECTED NOT DETECTED Final   Pseudomonas aeruginosa NOT DETECTED NOT DETECTED Final   Candida albicans NOT DETECTED NOT DETECTED Final   Candida glabrata NOT DETECTED NOT DETECTED Final   Candida krusei NOT DETECTED NOT DETECTED Final   Candida parapsilosis NOT DETECTED NOT DETECTED Final   Candida tropicalis NOT DETECTED NOT DETECTED Final    Comment: Performed at [facility], [address], [place], [place] [address]  Culture, blood (routine x 2)     Status: Abnormal   Collection Time: 12/18/18  1:20 PM  Result Value Ref Range Status   Specimen Description BLOOD RIGHT HAND  Final   Special Requests   Final    BOTTLES DRAWN AEROBIC ONLY Blood Culture results may not be optimal due to an inadequate volume of blood received in culture bottles   Culture  Setup Time   Final    GRAM POSITIVE COCCI IN CHAINS AEROBIC BOTTLE ONLY CRITICAL VALUE NOTED.  VALUE IS CONSISTENT WITH PREVIOUSLY REPORTED AND CALLED VALUE.    Culture (A)  Final    GROUP A STREP (S.PYOGENES) ISOLATED SUSCEPTIBILITIES PERFORMED ON PREVIOUS CULTURE WITHIN THE LAST 5 DAYS. HEALTH DEPARTMENT NOTIFIED Performed at [facility], [address] [place], [place] [address]    Report Status 12/21/2018 FINAL  Final  Gastrointestinal Panel by PCR , Stool     Status: None   Collection Time: 12/18/18  3:19 PM  Result Value Ref Range Status   Campylobacter species NOT DETECTED NOT DETECTED Final   Plesimonas  shigelloides NOT DETECTED NOT DETECTED Final   Salmonella species NOT DETECTED NOT DETECTED Final   Yersinia enterocolitica NOT DETECTED NOT DETECTED Final   Vibrio species NOT DETECTED NOT DETECTED Final   Vibrio cholerae NOT DETECTED NOT DETECTED Final   Enteroaggregative E coli (EAEC) NOT DETECTED NOT DETECTED Final   Enteropathogenic E coli (EPEC) NOT DETECTED NOT DETECTED Final   Enterotoxigenic E coli (ETEC) NOT DETECTED NOT DETECTED Final   Shiga like toxin producing E coli (STEC) NOT DETECTED NOT DETECTED Final   Shigella/Enteroinvasive E coli (EIEC) NOT DETECTED NOT DETECTED Final   Cryptosporidium NOT DETECTED NOT DETECTED Final   Cyclospora cayetanensis NOT DETECTED NOT DETECTED Final   Entamoeba histolytica NOT DETECTED NOT DETECTED Final   Giardia lamblia NOT DETECTED NOT DETECTED Final   Adenovirus F40/41 NOT DETECTED NOT DETECTED Final   Astrovirus NOT DETECTED NOT DETECTED Final   Norovirus GI/GII NOT DETECTED NOT DETECTED Final   Rotavirus A NOT DETECTED NOT DETECTED Final   Sapovirus (I, II, IV, and V) NOT DETECTED NOT DETECTED Final    Comment: Performed at [facility], [address] [place], [place] [address]  Culture, blood (routine x 2)     Status: None   Collection Time: 12/19/18  9:13 AM  Result Value Ref Range Status   Specimen Description BLOOD RIGHT ANTECUBITAL  Final   Special Requests AEROBIC BOTTLE ONLY Blood Culture adequate volume  Final   Culture   Final    NO GROWTH 5 DAYS Performed at [facility], [address], [place], [place] [address]    Report Status 12/24/2018 FINAL  Final  Culture, blood (routine x 2)     Status: None   Collection Time:  12/19/18  9:26 AM  Result Value Ref Range Status   Specimen Description BLOOD RIGHT THUMB  Final   Special Requests AEROBIC BOTTLE ONLY Blood Culture adequate volume  Final   Culture   Final    NO GROWTH 5 DAYS Performed at [facility], [address] [place], [place] [address]    Report Status 12/24/2018 FINAL  Final  MRSA PCR Screening     Status: None   Collection Time: 12/21/18  8:38 AM  Result Value Ref Range Status   MRSA by PCR NEGATIVE NEGATIVE Final    Comment:        The GeneXpert MRSA Assay (FDA approved for NASAL specimens only), is one component of a comprehensive MRSA colonization surveillance program. It is not intended to diagnose MRSA infection nor to guide or monitor treatment for MRSA infections. Performed at [facility], [address], [place], [place] [address]   C difficile quick scan w PCR reflex     Status: None   Collection Time: 12/24/18  1:54 AM  Result Value Ref Range Status   C Diff antigen NEGATIVE NEGATIVE Final   C Diff toxin NEGATIVE NEGATIVE Final   C Diff interpretation No C. difficile detected.  Final    Comment: Performed at [facility], [address] [place], [place] [address]     Scheduled Meds: . carvedilol  6.25 mg Oral BID WC  . cloNIDine  0.1 mg Oral QID   Followed by  . [START ON 12/26/2018] cloNIDine  0.1 mg Oral BH-qamhs   Followed by  . [START ON 12/28/2018] cloNIDine  0.1 mg Oral QAC breakfast  . enoxaparin (LOVENOX) injection  40 mg Subcutaneous Q24H  . folic acid  1 mg Oral Daily  . gabapentin  300 mg Oral BID  . levETIRAcetam  1,000 mg Oral BID  . multivitamin with minerals  1 tablet Oral Daily  . nicotine  21 mg Transdermal Daily  . pantoprazole  40 mg Oral Daily  . potassium chloride  40 mEq Oral TID  . vitamin B-6  100 mg Oral Daily  . QUEtiapine  50 mg Oral QHS  . thiamine  100 mg Oral Daily     LOS: 8 days   Lonia Blood, MD [facility] Office  [phone] Pager -  Text Page per Amion  If 7PM-7AM, please contact night-coverage per Amion 12/25/2018, 10:56 AM

## 2018-12-25 NOTE — Progress Notes (Signed)
Patient ID: Darren Diaz, male   DOB: 1986/08/25, 33 y.o.   MRN: [id number]         [facility]  Date of Admission:  12/17/2018    Total days of antibiotics 8        Day 7 penicillin         ASSESSMENT: He has resolving right leg cellulitis complicated by group a streptococcal bacteremia.  He is having persistent fevers.  Repeat blood cultures are negative and there is no evidence of endocarditis by TTE.  He has some haziness in his lung bases on chest x-ray but I suspect that this is heart failure and not pneumonia.  His C. difficile screen was negative.  Plain x-ray of his right shoulder did not show any abnormalities but I am can certify possibility of septic arthritis.  If he continues to have fever and/or severe shoulder and low back pain he will need further evaluation.  PLAN: 1. Continue penicillin 2. Await results of right arm Doppler ultrasound  Active Problems:   Alcohol withdrawal seizure, with unspecified complication (HCC)   Bacterial infection due to streptococcus, group A   Nonischemic cardiomyopathy (HCC)   Pressure injury of skin   Right shoulder pain   Scheduled Meds: . carvedilol  6.25 mg Oral BID WC  . cloNIDine  0.1 mg Oral QID   Followed by  . [START ON 12/26/2018] cloNIDine  0.1 mg Oral BH-qamhs   Followed by  . [START ON 12/28/2018] cloNIDine  0.1 mg Oral QAC breakfast  . enoxaparin (LOVENOX) injection  40 mg Subcutaneous Q24H  . folic acid  1 mg Oral Daily  . gabapentin  300 mg Oral BID  . levETIRAcetam  1,000 mg Oral BID  . multivitamin with minerals  1 tablet Oral Daily  . nicotine  21 mg Transdermal Daily  . pantoprazole  40 mg Oral Daily  . potassium chloride  40 mEq Oral TID  . vitamin B-6  100 mg Oral Daily  . QUEtiapine  50 mg Oral QHS  . sacubitril-valsartan  1 tablet Oral BID  . thiamine  100 mg Oral Daily   Continuous Infusions: . lactated ringers 10 mL/hr at 12/25/18 1132  . penicillin g continuous IV infusion  12 Million Units (12/25/18 0458)   PRN Meds:.acetaminophen, albuterol, bisacodyl, dicyclomine, hydrALAZINE, loperamide, LORazepam, methocarbamol, [DISCONTINUED] ondansetron **OR** ondansetron (ZOFRAN) IV, oxyCODONE, phenol, sennosides   SUBJECTIVE: He is still having acute on chronic right shoulder pain.  He also says that he has had some right lower back pain for about 2 weeks.  His parents are at the bedside.  His mother says that she thinks that he probably fell on his right side when he had a seizure recently.  Review of Systems: Review of Systems  Unable to perform ROS: Mental acuity    No Known Allergies  OBJECTIVE: Vitals:   12/25/18 1000 12/25/18 1100 12/25/18 1200 12/25/18 1317  BP: (!) 189/106  (!) 205/99 (!) 209/84  Pulse: 94 95 94 100  Resp: (!) 33 (!) 39 (!) 22 (!) 35  Temp:   99.3 F (37.4 C)   TempSrc:   Oral   SpO2: 100% 97% 96% 97%  Weight:      Height:       Body mass index is 31.25 kg/m.  Physical Exam Constitutional:      Comments: He is a little more alert today but still has difficulty responding to questions.  HENT:  Mouth/Throat:     Pharynx: No oropharyngeal exudate.  Eyes:     Conjunctiva/sclera: Conjunctivae normal.  Neck:     Musculoskeletal: Neck supple.  Cardiovascular:     Rate and Rhythm: Normal rate and regular rhythm.     Heart sounds: No murmur.  Pulmonary:     Effort: Pulmonary effort is normal.     Breath sounds: Normal breath sounds.  Abdominal:     Palpations: Abdomen is soft.     Tenderness: There is no abdominal tenderness.     Comments: Still having diarrhea.  Musculoskeletal:        General: Swelling and tenderness present.     Comments: His right shoulder is extremely sensitive with any palpation and range of motion.  He has edema of both arms.  There is a new area of redness in the right antecubital fossa.  He has no pain with range of motion of his hips.  Skin:    Comments: Right lower leg cellulitis is resolving.      Lab Results Lab Results  Component Value Date   WBC 7.4 12/25/2018   HGB 10.7 (L) 12/25/2018   HCT 31.3 (L) 12/25/2018   MCV 100.0 12/25/2018   PLT 342 12/25/2018    Lab Results  Component Value Date   CREATININE 1.38 (H) 12/25/2018   BUN 17 12/25/2018   NA 137 12/25/2018   K 4.7 12/25/2018   CL 113 (H) 12/25/2018   CO2 16 (L) 12/25/2018    Lab Results  Component Value Date   ALT 58 (H) 12/25/2018   AST 38 12/25/2018   ALKPHOS 49 12/25/2018   BILITOT 1.5 (H) 12/25/2018     Microbiology: Recent Results (from the past 240 hour(s))  Respiratory Panel by PCR     Status: None   Collection Time: 12/18/18 12:18 PM  Result Value Ref Range Status   Adenovirus NOT DETECTED NOT DETECTED Final   Coronavirus 229E NOT DETECTED NOT DETECTED Final    Comment: (NOTE) The Coronavirus on the Respiratory Panel, DOES NOT test for the novel  Coronavirus (2019 nCoV)    Coronavirus HKU1 NOT DETECTED NOT DETECTED Final   Coronavirus NL63 NOT DETECTED NOT DETECTED Final   Coronavirus OC43 NOT DETECTED NOT DETECTED Final   Metapneumovirus NOT DETECTED NOT DETECTED Final   Rhinovirus / Enterovirus NOT DETECTED NOT DETECTED Final   Influenza A NOT DETECTED NOT DETECTED Final   Influenza B NOT DETECTED NOT DETECTED Final   Parainfluenza Virus 1 NOT DETECTED NOT DETECTED Final   Parainfluenza Virus 2 NOT DETECTED NOT DETECTED Final   Parainfluenza Virus 3 NOT DETECTED NOT DETECTED Final   Parainfluenza Virus 4 NOT DETECTED NOT DETECTED Final   Respiratory Syncytial Virus NOT DETECTED NOT DETECTED Final   Bordetella pertussis NOT DETECTED NOT DETECTED Final   Chlamydophila pneumoniae NOT DETECTED NOT DETECTED Final   Mycoplasma pneumoniae NOT DETECTED NOT DETECTED Final    Comment: Performed at [facility], [address], [place], [place] [address]  Culture, blood (routine x 2)     Status: Abnormal   Collection Time: 12/18/18  1:00 PM  Result Value Ref Range Status   Specimen  Description BLOOD LEFT HAND  Final   Special Requests   Final    BOTTLES DRAWN AEROBIC ONLY Blood Culture adequate volume   Culture  Setup Time   Final    GRAM POSITIVE COCCI IN CHAINS AEROBIC BOTTLE ONLY CRITICAL RESULT CALLED TO, READ BACK BY AND  VERIFIED WITH: J.LADFORD,PHARMD AT 0450 ON 12/19/18 BY G.MCADOO    Culture (A)  Final    GROUP A STREP (S.PYOGENES) ISOLATED HEALTH DEPARTMENT NOTIFIED Performed at [facility], [address], [place], [place] [address]    Report Status 12/21/2018 FINAL  Final   Organism ID, Bacteria GROUP A STREP (S.PYOGENES) ISOLATED  Final      Susceptibility   Group a strep (s.pyogenes) isolated - MIC*    PENICILLIN <=0.06 SENSITIVE Sensitive     CEFTRIAXONE <=0.12 SENSITIVE Sensitive     ERYTHROMYCIN 2 RESISTANT Resistant     LEVOFLOXACIN <=0.25 SENSITIVE Sensitive     VANCOMYCIN <=0.12 SENSITIVE Sensitive     * GROUP A STREP (S.PYOGENES) ISOLATED  Blood Culture ID Panel (Reflexed)     Status: Abnormal   Collection Time: 12/18/18  1:00 PM  Result Value Ref Range Status   Enterococcus species NOT DETECTED NOT DETECTED Final   Listeria monocytogenes NOT DETECTED NOT DETECTED Final   Staphylococcus species NOT DETECTED NOT DETECTED Final   Staphylococcus aureus (BCID) NOT DETECTED NOT DETECTED Final   Streptococcus species DETECTED (A) NOT DETECTED Final    Comment: CRITICAL RESULT CALLED TO, READ BACK BY AND VERIFIED WITH: J.LADFORD,PHARMD AT 0450 ON 12/19/18 BY G.MCADOO    Streptococcus agalactiae NOT DETECTED NOT DETECTED Final   Streptococcus pneumoniae NOT DETECTED NOT DETECTED Final   Streptococcus pyogenes DETECTED (A) NOT DETECTED Final    Comment: CRITICAL RESULT CALLED TO, READ BACK BY AND VERIFIED WITH: J.LADFORD,PHARMD AT 0450 ON 12/19/18 BY G.MCADOO    Acinetobacter baumannii NOT DETECTED NOT DETECTED Final   Enterobacteriaceae species NOT DETECTED NOT DETECTED Final   Enterobacter cloacae complex NOT DETECTED NOT DETECTED Final    Escherichia coli NOT DETECTED NOT DETECTED Final   Klebsiella oxytoca NOT DETECTED NOT DETECTED Final   Klebsiella pneumoniae NOT DETECTED NOT DETECTED Final   Proteus species NOT DETECTED NOT DETECTED Final   Serratia marcescens NOT DETECTED NOT DETECTED Final   Haemophilus influenzae NOT DETECTED NOT DETECTED Final   Neisseria meningitidis NOT DETECTED NOT DETECTED Final   Pseudomonas aeruginosa NOT DETECTED NOT DETECTED Final   Candida albicans NOT DETECTED NOT DETECTED Final   Candida glabrata NOT DETECTED NOT DETECTED Final   Candida krusei NOT DETECTED NOT DETECTED Final   Candida parapsilosis NOT DETECTED NOT DETECTED Final   Candida tropicalis NOT DETECTED NOT DETECTED Final    Comment: Performed at [facility], [address] [place], [place] [address]  Culture, blood (routine x 2)     Status: Abnormal   Collection Time: 12/18/18  1:20 PM  Result Value Ref Range Status   Specimen Description BLOOD RIGHT HAND  Final   Special Requests   Final    BOTTLES DRAWN AEROBIC ONLY Blood Culture results may not be optimal due to an inadequate volume of blood received in culture bottles   Culture  Setup Time   Final    GRAM POSITIVE COCCI IN CHAINS AEROBIC BOTTLE ONLY CRITICAL VALUE NOTED.  VALUE IS CONSISTENT WITH PREVIOUSLY REPORTED AND CALLED VALUE.    Culture (A)  Final    GROUP A STREP (S.PYOGENES) ISOLATED SUSCEPTIBILITIES PERFORMED ON PREVIOUS CULTURE WITHIN THE LAST 5 DAYS. HEALTH DEPARTMENT NOTIFIED Performed at [facility], [address], [place], [place] [address]    Report Status 12/21/2018 FINAL  Final  Gastrointestinal Panel by PCR , Stool     Status: None   Collection Time: 12/18/18  3:19 PM  Result Value Ref Range Status   Campylobacter species NOT DETECTED NOT DETECTED Final   Plesimonas shigelloides NOT DETECTED NOT DETECTED Final   Salmonella species NOT DETECTED NOT DETECTED Final   Yersinia enterocolitica NOT DETECTED NOT DETECTED Final    Vibrio species NOT DETECTED NOT DETECTED Final   Vibrio cholerae NOT DETECTED NOT DETECTED Final   Enteroaggregative E coli (EAEC) NOT DETECTED NOT DETECTED Final   Enteropathogenic E coli (EPEC) NOT DETECTED NOT DETECTED Final   Enterotoxigenic E coli (ETEC) NOT DETECTED NOT DETECTED Final   Shiga like toxin producing E coli (STEC) NOT DETECTED NOT DETECTED Final   Shigella/Enteroinvasive E coli (EIEC) NOT DETECTED NOT DETECTED Final   Cryptosporidium NOT DETECTED NOT DETECTED Final   Cyclospora cayetanensis NOT DETECTED NOT DETECTED Final   Entamoeba histolytica NOT DETECTED NOT DETECTED Final   Giardia lamblia NOT DETECTED NOT DETECTED Final   Adenovirus F40/41 NOT DETECTED NOT DETECTED Final   Astrovirus NOT DETECTED NOT DETECTED Final   Norovirus GI/GII NOT DETECTED NOT DETECTED Final   Rotavirus A NOT DETECTED NOT DETECTED Final   Sapovirus (I, II, IV, and V) NOT DETECTED NOT DETECTED Final    Comment: Performed at [facility], [address] [place], [place] [address]  Culture, blood (routine x 2)     Status: None   Collection Time: 12/19/18  9:13 AM  Result Value Ref Range Status   Specimen Description BLOOD RIGHT ANTECUBITAL  Final   Special Requests AEROBIC BOTTLE ONLY Blood Culture adequate volume  Final   Culture   Final    NO GROWTH 5 DAYS Performed at [facility], [address], [place], [place] [address]    Report Status 12/24/2018 FINAL  Final  Culture, blood (routine x 2)     Status: None   Collection Time: 12/19/18  9:26 AM  Result Value Ref Range Status   Specimen Description BLOOD RIGHT THUMB  Final   Special Requests AEROBIC BOTTLE ONLY Blood Culture adequate volume  Final   Culture   Final    NO GROWTH 5 DAYS Performed at [facility], [address] [place], [place] [address]    Report Status 12/24/2018 FINAL  Final  MRSA PCR Screening     Status: None   Collection Time: 12/21/18  8:38 AM  Result Value Ref Range Status   MRSA  by PCR NEGATIVE NEGATIVE Final    Comment:        The GeneXpert MRSA Assay (FDA approved for NASAL specimens only), is one component of a comprehensive MRSA colonization surveillance program. It is not intended to diagnose MRSA infection nor to guide or monitor treatment for MRSA infections. Performed at [facility], [address], [place], [place] [address]   C difficile quick scan w PCR reflex     Status: None   Collection Time: 12/24/18  1:54 AM  Result Value Ref Range Status   C Diff antigen NEGATIVE NEGATIVE Final   C Diff toxin NEGATIVE NEGATIVE Final   C Diff interpretation No C. difficile detected.  Final    Comment: Performed at [facility], [address], [place], [place] [address]    Cliffton Asters, MD [facility] [phone] pager   [phone] cell 12/25/2018, 1:48 PM

## 2018-12-25 NOTE — Progress Notes (Signed)
Rehab Admissions Coordinator Note:  Per PT recommendation, this patient was screened by Nanine Means for appropriateness for an Inpatient Acute Rehab Consult.  At this time, we are recommending an Inpatient Rehab consult. AC will contact MD and request an IP Rehab Consult Order.   Nanine Means 12/25/2018, 2:11 PM  I can be reached at [phone].

## 2018-12-25 NOTE — Progress Notes (Signed)
[facility] hospitalist notified of pts BP in 160's after 5mg  of hydralazine. New order of 10mg  hydralazine ordered and given to patient. Will continue to monitor.

## 2018-12-25 NOTE — Progress Notes (Addendum)
Progress Note  Patient Name: Darren Diaz Date of Encounter: 12/25/2018  Primary Cardiologist:   No primary care provider on file.   Subjective   Sedated.  No distress  Inpatient Medications    Scheduled Meds: . carvedilol  6.25 mg Oral BID WC  . cloNIDine  0.1 mg Oral QID   Followed by  . [START ON 12/26/2018] cloNIDine  0.1 mg Oral BH-qamhs   Followed by  . [START ON 12/28/2018] cloNIDine  0.1 mg Oral QAC breakfast  . enoxaparin (LOVENOX) injection  40 mg Subcutaneous Q24H  . folic acid  1 mg Oral Daily  . gabapentin  300 mg Oral BID  . levETIRAcetam  1,000 mg Oral BID  . multivitamin with minerals  1 tablet Oral Daily  . nicotine  21 mg Transdermal Daily  . pantoprazole  40 mg Oral Daily  . potassium chloride  40 mEq Oral TID  . vitamin B-6  100 mg Oral Daily  . QUEtiapine  50 mg Oral QHS  . thiamine  100 mg Oral Daily   Continuous Infusions: . lactated ringers 25 mL/hr at 12/25/18 0900  . penicillin g continuous IV infusion 12 Million Units (12/25/18 0458)   PRN Meds: acetaminophen, albuterol, bisacodyl, dicyclomine, hydrALAZINE, loperamide, LORazepam, methocarbamol, [DISCONTINUED] ondansetron **OR** ondansetron (ZOFRAN) IV, oxyCODONE, phenol, sennosides   Vital Signs    Vitals:   12/25/18 0549 12/25/18 0600 12/25/18 0700 12/25/18 0800  BP: (!) 168/84 (!) 163/95 (!) 151/83 (!) 144/106  Pulse: (!) 114 (!) 112 (!) 105 (!) 104  Resp: (!) 27 (!) 41 (!) 30 (!) 26  Temp:    99.1 F (37.3 C)  TempSrc:    Oral  SpO2: 94% 95% 98% 97%  Weight:      Height:        Intake/Output Summary (Last 24 hours) at 12/25/2018 1109 Last data filed at 12/25/2018 0900 Gross per 24 hour  Intake 1486.32 ml  Output 3735 ml  Net -2248.68 ml   Filed Weights   12/23/18 0500 12/24/18 0500 12/25/18 0435  Weight: 97.9 kg 98.9 kg 98.8 kg    Telemetry    Sinus tachycardia - Personally Reviewed  ECG    NA - Personally Reviewed  Physical Exam   GEN: No acute distress.    Neck: No  JVD Cardiac: RRR, no murmurs, rubs, or gallops.  Respiratory:    Clear to auscultation bilaterally. GI: Soft, nontender, non-distended, normal bowel sounds .   MS:  Mild leg edema; No deformity. Neuro:   Nonfocal.   Psych:   Very confused.     Labs    Chemistry Recent Labs  Lab 12/20/18 0332 12/21/18 0335  12/23/18 0234 12/24/18 0616 12/25/18 0428  NA 137 140   < > 140 137 137  K 3.4* 4.3   < > 4.4 3.1* 4.7  CL 110 114*   < > 111 107 113*  CO2 21* 20*   < > 22 19* 16*  GLUCOSE 107* 93   < > 122* 105* 92  BUN 19 18   < > 25* 19 17  CREATININE 1.53* 1.55*   < > 1.54* 1.46* 1.38*  CALCIUM 8.0* 8.8*   < > 8.3* 8.4* 8.0*  PROT 5.6* 6.1*  --   --   --  6.1*  ALBUMIN 2.7* 2.6*  --   --   --  2.3*  AST 549* 158*  --   --   --  38  ALT 512* 285*  --   --   --  58*  ALKPHOS 30* 45  --   --   --  49  BILITOT 1.6* 2.2*  --   --   --  1.5*  GFRNONAA 58* 58*   < > 58* >60 >60  GFRAA >60 >60   < > >60 >60 >60  ANIONGAP 6 6   < > 7 11 8    < > = values in this interval not displayed.     Hematology Recent Labs  Lab 12/23/18 0234 12/24/18 0616 12/25/18 0803  WBC 5.5 7.2 7.4  RBC 2.87* 3.10* 3.13*  3.13*  HGB 9.8* 10.7* 10.7*  HCT 29.8* 30.7* 31.3*  MCV 103.8* 99.0 100.0  MCH 34.1* 34.5* 34.2*  MCHC 32.9 34.9 34.2  RDW 13.3 12.6 12.7  PLT 145* 218 342    Cardiac EnzymesNo results for input(s): TROPONINI in the last 168 hours. No results for input(s): TROPIPOC in the last 168 hours.   BNP Recent Labs  Lab 12/21/18 0738  BNP 3,126.8*     DDimer No results for input(s): DDIMER in the last 168 hours.   Radiology    No results found.  Cardiac Studies   ECHO:     1. The left ventricle has a visually estimated ejection fraction of of 35%. The cavity size was mildly dilated. Left ventricular diastolic Doppler parameters are consistent with impaired relaxation. Left ventricular diffuse hypokinesis.  2. The right ventricle has normal systolic function. The  cavity was normal. There is no increase in right ventricular wall thickness.  3. No evidence of mitral valve stenosis. Trivial mitral regurgitation.  4. The aortic valve is tricuspid no stenosis of the aortic valve.  5. The aortic root and ascending aorta are normal in size and structure.  6. The inferior vena cava was dilated in size with <50% respiratory variability. No complete TR doppler jet so unable to estimate PA systolic pressure.  Patient Profile     33 y.o. 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 Jerral Ralph.  Hx of PDA repair at age 65. Per parent, it was minor. No cardiac issue since then.    Assessment & Plan    ACUTE SYSTOLIC HF:   Started on beta blocker and Entresto.  He is negative 3.2 liters this admission.  Tolerating this.  BP fluctuates somewhat.  As the clonidine is reduced we can titrate up the the HF meds.  Continue current meds.     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  Stable.  Follow.     For questions or updates, please contact [facility] Please consult [email or web address] for contact info under Cardiology/STEMI.   Signed, Rollene Rotunda, MD  12/25/2018, 11:09 AM

## 2018-12-25 NOTE — Evaluation (Signed)
Physical Therapy Evaluation Patient Details Name: Darren Diaz MRN: [id number] DOB: [birth year] Today's Date: 12/25/2018   History of Present Illness  Patient is a 33 y/o male who presents with seizure x8 mins secondary to ETOH withdrawal, sepsis, bacteremia and newly diagnosed systolic heart failure. Intubated 3/11-3/13. 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  Clinical Impression  Patient presents with generalized weakness, RUE pain/weakness, impaired balance and cognitive deficits relating to orientation, awareness, attention and ability to follow commands s/p above. Pt independent PTA, driving and working as a Customer service manager). Pt tolerated bed mobility and standing transfer with Mod A of 2 due to posterior lean and BLEs locked out into extension. Transfer to chair deferred per nurse due to confusion and restraints and session limited due to flexiseal leaking. Would benefit from intensive therapies to maximize independence and mobility prior to return home. Will follow acutely.    Follow Up Recommendations CIR;Supervision for mobility/OOB(pending improvement)    Equipment Recommendations  Other (comment)(TBA)    Recommendations for Other Services OT consult     Precautions / Restrictions Precautions Precautions: Fall Precaution Comments: flexiseal, restraints Restrictions Weight Bearing Restrictions: No      Mobility  Bed Mobility Overal bed mobility: Needs Assistance Bed Mobility: Rolling;Supine to Sit;Sit to Supine Rolling: Max assist   Supine to sit: Mod assist;+2 for safety/equipment Sit to supine: Mod assist;HOB elevated   General bed mobility comments: ASsist to roll to right/left with Max A, difficulty rolling left due to RUE pain/weakness/lack of mobility. Increased time/effort to get to EOB, assist with trunk and LEs. Assist to bring LEs to return to supine. Able to scoot along side bed with min guard assist.   Transfers Overall  transfer level: Needs assistance Equipment used: 2 person hand held assist Transfers: Sit to/from Stand Sit to Stand: Mod assist;+2 physical assistance         General transfer comment: Assist to power to standing with posterior lean and BLEs locked out into extension; tremoring/shaking noted throughout. Flexiseal leaking so needed to sit.  Ambulation/Gait             General Gait Details: Deferred due to above.  Stairs            Wheelchair Mobility    Modified Rankin (Stroke Patients Only)       Balance Overall balance assessment: Needs assistance Sitting-balance support: Feet supported;Bilateral upper extremity supported Sitting balance-Leahy Scale: Fair Sitting balance - Comments: Able to sit statically and for MMT of LEs with posterior lean and Min guard.    Standing balance support: During functional activity Standing balance-Leahy Scale: Poor Standing balance comment: Requires external support for standing balance; posterior lean and tremoring noted.                              Pertinent Vitals/Pain Pain Assessment: Faces Faces Pain Scale: Hurts whole lot Pain Location: RUE/shoulder Pain Descriptors / Indicators: Grimacing;Guarding;Sore Pain Intervention(s): Monitored during session;Repositioned    Home Living Family/patient expects to be discharged to:: Private residence Living Arrangements: Alone Available Help at Discharge: Family;Available PRN/intermittently Type of Home: House Home Access: Stairs to enter Entrance Stairs-Rails: Can reach both;Right;Left Entrance Stairs-Number of Steps: 3 Home Layout: Two level Home Equipment: None      Prior Function Level of Independence: Independent         Comments: 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      Hand Dominance   Dominant Hand: Right    Extremity/Trunk Assessment   Upper Extremity Assessment Upper Extremity  Assessment: Defer to OT evaluation;RUE deficits/detail RUE Deficits / Details: Erythema and redness RUE; limited AROM in all motions secondary to pain esp elevation. Decreased strength in elbow flexion/ext    Lower Extremity Assessment Lower Extremity Assessment: Generalized weakness(Grossly ~3+/5 throughout)    Cervical / Trunk Assessment Cervical / Trunk Assessment: Normal  Communication   Communication: Expressive difficulties(mumbled speech- difficult to understand at times. )  Cognition Arousal/Alertness: Lethargic Behavior During Therapy: Flat affect Overall Cognitive Status: Impaired/Different from baseline Area of Impairment: Orientation;Attention;Memory;Following commands;Safety/judgement;Awareness;Problem solving                 Orientation Level: Disoriented to;Place;Situation Current Attention Level: Sustained Memory: Decreased short-term memory Following Commands: Follows multi-step commands inconsistently;Follows one step commands with increased time(and repetition) Safety/Judgement: Decreased awareness of deficits Awareness: Intellectual Problem Solving: Slow processing;Requires verbal cues;Difficulty sequencing        General Comments General comments (skin integrity, edema, etc.): Father present and stepped out for assessment. VSS throughout. Performed session on RA and Sp02 remained in high 90s.    Exercises     Assessment/Plan    PT Assessment Patient needs continued PT services  PT Problem List Decreased strength;Decreased balance;Decreased cognition;Pain;Decreased mobility;Decreased range of motion;Decreased activity tolerance       PT Treatment Interventions Functional mobility training;Balance training;Patient/family education;Gait training;Therapeutic activities;Therapeutic exercise;Stair training;DME instruction;Cognitive remediation    PT Goals (Current goals can be found in the Care Plan section)  Acute Rehab PT Goals Patient Stated Goal:  none stated PT Goal Formulation: Patient unable to participate in goal setting Time For Goal Achievement: 01/08/19 Potential to Achieve Goals: Fair    Frequency Min 3X/week   Barriers to discharge Decreased caregiver support lives alone    Co-evaluation               AM-PAC PT "6 Clicks" Mobility  Outcome Measure Help needed turning from your back to your side while in a flat bed without using bedrails?: Total Help needed moving from lying on your back to sitting on the side of a flat bed without using bedrails?: A Lot Help needed moving to and from a bed to a chair (including a wheelchair)?: A Lot Help needed standing up from a chair using your arms (e.g., wheelchair or bedside chair)?: Total Help needed to walk in hospital room?: Total   6 Click Score: 7    End of Session Equipment Utilized During Treatment: Gait belt Activity Tolerance: Patient limited by pain;Patient tolerated treatment well Patient left: in bed;with call bell/phone within reach;with bed alarm set;with nursing/sitter in room Nurse Communication: Mobility status PT Visit Diagnosis: Pain;Muscle weakness (generalized) (M62.81);Difficulty in walking, not elsewhere classified (R26.2);Unsteadiness on feet (R26.81) Pain - Right/Left: Right Pain - part of body: Shoulder;Arm    Time: 0998-3382 PT Time Calculation (min) (ACUTE ONLY): 41 min   Charges:   PT Evaluation $PT Eval Moderate Complexity: 1 Mod PT Treatments $Therapeutic Activity: 23-37 mins        Mylo Red, [place], DPT Acute Rehabilitation Services Pager [phone] Office [phone]      Blake Divine A Lanier Ensign 12/25/2018, 12:27 PM

## 2018-12-25 NOTE — Progress Notes (Signed)
   12/25/18 1317  Vitals  BP (!) 209/84  MAP (mmHg) 116  Pulse Rate 100  ECG Heart Rate 99  Resp (!) 35  Oxygen Therapy  SpO2 97 %  provider made aware orders on [facility]

## 2018-12-26 ENCOUNTER — Inpatient Hospital Stay (HOSPITAL_COMMUNITY): Payer: BLUE CROSS/BLUE SHIELD

## 2018-12-26 LAB — CBC
HCT: 33.5 % — ABNORMAL LOW (ref 39.0–52.0)
Hemoglobin: 11.4 g/dL — ABNORMAL LOW (ref 13.0–17.0)
MCH: 35.3 pg — ABNORMAL HIGH (ref 26.0–34.0)
MCHC: 34 g/dL (ref 30.0–36.0)
MCV: 103.7 fL — AB (ref 80.0–100.0)
NRBC: 0 % (ref 0.0–0.2)
Platelets: 424 10*3/uL — ABNORMAL HIGH (ref 150–400)
RBC: 3.23 MIL/uL — ABNORMAL LOW (ref 4.22–5.81)
RDW: 13.2 % (ref 11.5–15.5)
WBC: 7.3 10*3/uL (ref 4.0–10.5)

## 2018-12-26 LAB — GLUCOSE, CAPILLARY: Glucose-Capillary: 95 mg/dL (ref 70–99)

## 2018-12-26 LAB — COMPREHENSIVE METABOLIC PANEL
ALT: 46 U/L — ABNORMAL HIGH (ref 0–44)
ANION GAP: 7 (ref 5–15)
AST: 38 U/L (ref 15–41)
Albumin: 2.3 g/dL — ABNORMAL LOW (ref 3.5–5.0)
Alkaline Phosphatase: 47 U/L (ref 38–126)
BUN: 16 mg/dL (ref 6–20)
CO2: 17 mmol/L — ABNORMAL LOW (ref 22–32)
Calcium: 8.4 mg/dL — ABNORMAL LOW (ref 8.9–10.3)
Chloride: 115 mmol/L — ABNORMAL HIGH (ref 98–111)
Creatinine, Ser: 1.45 mg/dL — ABNORMAL HIGH (ref 0.61–1.24)
GFR calc Af Amer: >60 mL/min (ref >60)
GFR calc non Af Amer: >60 mL/min (ref >60)
Glucose, Bld: 99 mg/dL (ref 70–99)
Potassium: 4.6 mmol/L (ref 3.5–5.1)
Sodium: 139 mmol/L (ref 135–145)
Total Bilirubin: 1.3 mg/dL — ABNORMAL HIGH (ref 0.3–1.2)
Total Protein: 6.5 g/dL (ref 6.5–8.1)

## 2018-12-26 LAB — URINALYSIS, ROUTINE W REFLEX MICROSCOPIC
BILIRUBIN URINE: NEGATIVE
Bacteria, UA: NONE SEEN
Glucose, UA: NEGATIVE mg/dL
Ketones, ur: NEGATIVE mg/dL
Leukocytes,Ua: NEGATIVE
Nitrite: NEGATIVE
PROTEIN: NEGATIVE mg/dL
SPECIFIC GRAVITY, URINE: 1.011 (ref 1.005–1.030)
pH: 5 (ref 5.0–8.0)

## 2018-12-26 MED ORDER — CLONIDINE HCL 0.1 MG PO TABS
0.1000 mg | ORAL_TABLET | ORAL | Status: AC
  Administered 2018-12-28 – 2018-12-30 (×4): 0.1 mg via ORAL
  Filled 2018-12-26 (×4): qty 1

## 2018-12-26 MED ORDER — LEVALBUTEROL HCL 0.63 MG/3ML IN NEBU: 0.6300 mg | INHALATION_SOLUTION | RESPIRATORY_TRACT | Status: DC | PRN

## 2018-12-26 MED ORDER — HALOPERIDOL LACTATE 5 MG/ML IJ SOLN: 2.0000 mg | Freq: Four times a day (QID) | INTRAMUSCULAR | Status: DC | PRN

## 2018-12-26 MED ORDER — ORAL CARE MOUTH RINSE
15.0000 mL | Freq: Two times a day (BID) | OROMUCOSAL | Status: DC
  Administered 2018-12-27 – 2019-01-01 (×8): 15 mL via OROMUCOSAL

## 2018-12-26 MED ORDER — GERHARDT'S BUTT CREAM
TOPICAL_CREAM | CUTANEOUS | Status: DC | PRN
  Administered 2018-12-26: 10:00:00 via TOPICAL
  Filled 2018-12-26: qty 1

## 2018-12-26 MED ORDER — ONDANSETRON 4 MG PO TBDP
4.0000 mg | ORAL_TABLET | Freq: Four times a day (QID) | ORAL | Status: DC | PRN
  Filled 2018-12-26: qty 1

## 2018-12-26 MED ORDER — CLONIDINE HCL 0.1 MG PO TABS
0.1000 mg | ORAL_TABLET | Freq: Four times a day (QID) | ORAL | Status: AC
  Administered 2018-12-26 – 2018-12-28 (×7): 0.1 mg via ORAL
  Filled 2018-12-26 (×8): qty 1

## 2018-12-26 MED ORDER — CLONIDINE HCL 0.1 MG PO TABS
0.1000 mg | ORAL_TABLET | Freq: Every day | ORAL | Status: AC
  Administered 2018-12-31 – 2019-01-01 (×2): 0.1 mg via ORAL
  Filled 2018-12-26 (×2): qty 1

## 2018-12-26 NOTE — Progress Notes (Signed)
Patient ID: Darren Diaz, male   DOB: 10/04/1986, 33 y.o.   MRN: [id number]         [facility]  Date of Admission:  12/17/2018    Total days of antibiotics 9        Day 8 penicillin         ASSESSMENT: He presented with group A streptococcal bacteremia.  Repeat blood cultures on 12/19/2018 were negative.  Right arm Doppler ultrasound did not show any evidence of clot.  He continues to spike fevers.  I will repeat cultures and a chest x-ray today  PLAN: 1. Continue penicillin 2. Repeat chest x-ray 3. Repeat blood and urine cultures  Active Problems:   Alcohol withdrawal seizure, with unspecified complication (HCC)   Bacterial infection due to streptococcus, group A   Nonischemic cardiomyopathy (HCC)   Pressure injury of skin   Right shoulder pain   Scheduled Meds: . carvedilol  12.5 mg Oral BID WC  . cloNIDine  0.1 mg Oral BH-qamhs   Followed by  . [START ON 12/28/2018] cloNIDine  0.1 mg Oral QAC breakfast  . enoxaparin (LOVENOX) injection  40 mg Subcutaneous Q24H  . folic acid  1 mg Oral Daily  . gabapentin  300 mg Oral BID  . levETIRAcetam  1,000 mg Oral BID  . multivitamin with minerals  1 tablet Oral Daily  . nicotine  21 mg Transdermal Daily  . pantoprazole  40 mg Oral Daily  . vitamin B-6  100 mg Oral Daily  . QUEtiapine  50 mg Oral QHS  . sacubitril-valsartan  1 tablet Oral BID  . thiamine  100 mg Oral Daily   Continuous Infusions: . lactated ringers 10 mL/hr at 12/26/18 0700  . nitroGLYCERIN 30 mcg/min (12/26/18 0700)  . penicillin g continuous IV infusion 12 Million Units (12/26/18 0502)   PRN Meds:.acetaminophen, albuterol, bisacodyl, dicyclomine, Gerhardt's butt cream, hydrALAZINE, loperamide, LORazepam, methocarbamol, [DISCONTINUED] ondansetron **OR** ondansetron (ZOFRAN) IV, oxyCODONE, phenol, sennosides   Review of Systems: Review of Systems  Unable to perform ROS: Mental acuity    No Known Allergies  OBJECTIVE: Vitals:   12/26/18 0600 12/26/18 0630 12/26/18 0700 12/26/18 0838  BP: (!) 155/69 (!) 152/111 (!) 158/93   Pulse: (!) 122 (!) 121 (!) 126   Resp: (!) 28 20 (!) 21   Temp:    (!) 101.6 F (38.7 C)  TempSrc:    Axillary  SpO2: 100% 96% 97%   Weight:      Height:       Body mass index is 31.25 kg/m.  Physical Exam Constitutional:      Comments: He is babbling unintelligibly.  HENT:     Mouth/Throat:     Pharynx: No oropharyngeal exudate.  Eyes:     Conjunctiva/sclera: Conjunctivae normal.  Neck:     Musculoskeletal: Neck supple.  Cardiovascular:     Rate and Rhythm: Regular rhythm.     Heart sounds: No murmur.     Comments: He is tachycardic and hypertensive. Pulmonary:     Effort: Pulmonary effort is normal.     Breath sounds: Normal breath sounds.  Abdominal:     Palpations: Abdomen is soft.     Comments: Still having diarrhea.  Musculoskeletal:     Comments: The cellulitis on his right lower leg has resolved.  The patch of redness in the right antecubital fossa is smaller.  He has some mild redness in his left upper arm where an IV had been taken  previously.  Skin:    Comments: Right lower leg cellulitis is resolving.     Lab Results Lab Results  Component Value Date   WBC 7.3 12/26/2018   HGB 11.4 (L) 12/26/2018   HCT 33.5 (L) 12/26/2018   MCV 103.7 (H) 12/26/2018   PLT 424 (H) 12/26/2018    Lab Results  Component Value Date   CREATININE 1.45 (H) 12/26/2018   BUN 16 12/26/2018   NA 139 12/26/2018   K 4.6 12/26/2018   CL 115 (H) 12/26/2018   CO2 17 (L) 12/26/2018    Lab Results  Component Value Date   ALT 46 (H) 12/26/2018   AST 38 12/26/2018   ALKPHOS 47 12/26/2018   BILITOT 1.3 (H) 12/26/2018     Microbiology: Recent Results (from the past 240 hour(s))  Respiratory Panel by PCR     Status: None   Collection Time: 12/18/18 12:18 PM  Result Value Ref Range Status   Adenovirus NOT DETECTED NOT DETECTED Final   Coronavirus 229E NOT DETECTED NOT DETECTED  Final    Comment: (NOTE) The Coronavirus on the Respiratory Panel, DOES NOT test for the novel  Coronavirus (2019 nCoV)    Coronavirus HKU1 NOT DETECTED NOT DETECTED Final   Coronavirus NL63 NOT DETECTED NOT DETECTED Final   Coronavirus OC43 NOT DETECTED NOT DETECTED Final   Metapneumovirus NOT DETECTED NOT DETECTED Final   Rhinovirus / Enterovirus NOT DETECTED NOT DETECTED Final   Influenza A NOT DETECTED NOT DETECTED Final   Influenza B NOT DETECTED NOT DETECTED Final   Parainfluenza Virus 1 NOT DETECTED NOT DETECTED Final   Parainfluenza Virus 2 NOT DETECTED NOT DETECTED Final   Parainfluenza Virus 3 NOT DETECTED NOT DETECTED Final   Parainfluenza Virus 4 NOT DETECTED NOT DETECTED Final   Respiratory Syncytial Virus NOT DETECTED NOT DETECTED Final   Bordetella pertussis NOT DETECTED NOT DETECTED Final   Chlamydophila pneumoniae NOT DETECTED NOT DETECTED Final   Mycoplasma pneumoniae NOT DETECTED NOT DETECTED Final    Comment: Performed at [facility], [address], [place], [place] [address]  Culture, blood (routine x 2)     Status: Abnormal   Collection Time: 12/18/18  1:00 PM  Result Value Ref Range Status   Specimen Description BLOOD LEFT HAND  Final   Special Requests   Final    BOTTLES DRAWN AEROBIC ONLY Blood Culture adequate volume   Culture  Setup Time   Final    GRAM POSITIVE COCCI IN CHAINS AEROBIC BOTTLE ONLY CRITICAL RESULT CALLED TO, READ BACK BY AND VERIFIED WITH: J.LADFORD,PHARMD AT 0450 ON 12/19/18 BY G.MCADOO    Culture (A)  Final    GROUP A STREP (S.PYOGENES) ISOLATED HEALTH DEPARTMENT NOTIFIED Performed at [facility], [address], [place], [place] [address]    Report Status 12/21/2018 FINAL  Final   Organism ID, Bacteria GROUP A STREP (S.PYOGENES) ISOLATED  Final      Susceptibility   Group a strep (s.pyogenes) isolated - MIC*    PENICILLIN <=0.06 SENSITIVE Sensitive     CEFTRIAXONE <=0.12 SENSITIVE Sensitive     ERYTHROMYCIN 2  RESISTANT Resistant     LEVOFLOXACIN <=0.25 SENSITIVE Sensitive     VANCOMYCIN <=0.12 SENSITIVE Sensitive     * GROUP A STREP (S.PYOGENES) ISOLATED  Blood Culture ID Panel (Reflexed)     Status: Abnormal   Collection Time: 12/18/18  1:00 PM  Result Value Ref Range Status   Enterococcus species NOT DETECTED NOT  DETECTED Final   Listeria monocytogenes NOT DETECTED NOT DETECTED Final   Staphylococcus species NOT DETECTED NOT DETECTED Final   Staphylococcus aureus (BCID) NOT DETECTED NOT DETECTED Final   Streptococcus species DETECTED (A) NOT DETECTED Final    Comment: CRITICAL RESULT CALLED TO, READ BACK BY AND VERIFIED WITH: J.LADFORD,PHARMD AT 0450 ON 12/19/18 BY G.MCADOO    Streptococcus agalactiae NOT DETECTED NOT DETECTED Final   Streptococcus pneumoniae NOT DETECTED NOT DETECTED Final   Streptococcus pyogenes DETECTED (A) NOT DETECTED Final    Comment: CRITICAL RESULT CALLED TO, READ BACK BY AND VERIFIED WITH: J.LADFORD,PHARMD AT 0450 ON 12/19/18 BY G.MCADOO    Acinetobacter baumannii NOT DETECTED NOT DETECTED Final   Enterobacteriaceae species NOT DETECTED NOT DETECTED Final   Enterobacter cloacae complex NOT DETECTED NOT DETECTED Final   Escherichia coli NOT DETECTED NOT DETECTED Final   Klebsiella oxytoca NOT DETECTED NOT DETECTED Final   Klebsiella pneumoniae NOT DETECTED NOT DETECTED Final   Proteus species NOT DETECTED NOT DETECTED Final   Serratia marcescens NOT DETECTED NOT DETECTED Final   Haemophilus influenzae NOT DETECTED NOT DETECTED Final   Neisseria meningitidis NOT DETECTED NOT DETECTED Final   Pseudomonas aeruginosa NOT DETECTED NOT DETECTED Final   Candida albicans NOT DETECTED NOT DETECTED Final   Candida glabrata NOT DETECTED NOT DETECTED Final   Candida krusei NOT DETECTED NOT DETECTED Final   Candida parapsilosis NOT DETECTED NOT DETECTED Final   Candida tropicalis NOT DETECTED NOT DETECTED Final    Comment: Performed at [facility], [address], [place], [place] [address]  Culture, blood (routine x 2)     Status: Abnormal   Collection Time: 12/18/18  1:20 PM  Result Value Ref Range Status   Specimen Description BLOOD RIGHT HAND  Final   Special Requests   Final    BOTTLES DRAWN AEROBIC ONLY Blood Culture results may not be optimal due to an inadequate volume of blood received in culture bottles   Culture  Setup Time   Final    GRAM POSITIVE COCCI IN CHAINS AEROBIC BOTTLE ONLY CRITICAL VALUE NOTED.  VALUE IS CONSISTENT WITH PREVIOUSLY REPORTED AND CALLED VALUE.    Culture (A)  Final    GROUP A STREP (S.PYOGENES) ISOLATED SUSCEPTIBILITIES PERFORMED ON PREVIOUS CULTURE WITHIN THE LAST 5 DAYS. HEALTH DEPARTMENT NOTIFIED Performed at [facility], [address], [place], [place] [address]    Report Status 12/21/2018 FINAL  Final  Gastrointestinal Panel by PCR , Stool     Status: None   Collection Time: 12/18/18  3:19 PM  Result Value Ref Range Status   Campylobacter species NOT DETECTED NOT DETECTED Final   Plesimonas shigelloides NOT DETECTED NOT DETECTED Final   Salmonella species NOT DETECTED NOT DETECTED Final   Yersinia enterocolitica NOT DETECTED NOT DETECTED Final   Vibrio species NOT DETECTED NOT DETECTED Final   Vibrio cholerae NOT DETECTED NOT DETECTED Final   Enteroaggregative E coli (EAEC) NOT DETECTED NOT DETECTED Final   Enteropathogenic E coli (EPEC) NOT DETECTED NOT DETECTED Final   Enterotoxigenic E coli (ETEC) NOT DETECTED NOT DETECTED Final   Shiga like toxin producing E coli (STEC) NOT DETECTED NOT DETECTED Final   Shigella/Enteroinvasive E coli (EIEC) NOT DETECTED NOT DETECTED Final   Cryptosporidium NOT DETECTED NOT DETECTED Final   Cyclospora cayetanensis NOT DETECTED NOT DETECTED Final   Entamoeba histolytica NOT DETECTED NOT DETECTED Final   Giardia lamblia NOT DETECTED NOT DETECTED Final   Adenovirus  F40/41 NOT DETECTED NOT DETECTED Final   Astrovirus NOT DETECTED NOT DETECTED Final    Norovirus GI/GII NOT DETECTED NOT DETECTED Final   Rotavirus A NOT DETECTED NOT DETECTED Final   Sapovirus (I, II, IV, and V) NOT DETECTED NOT DETECTED Final    Comment: Performed at [facility], [address] [place], [place] [address]  Culture, blood (routine x 2)     Status: None   Collection Time: 12/19/18  9:13 AM  Result Value Ref Range Status   Specimen Description BLOOD RIGHT ANTECUBITAL  Final   Special Requests AEROBIC BOTTLE ONLY Blood Culture adequate volume  Final   Culture   Final    NO GROWTH 5 DAYS Performed at [facility], [address], [place], [place] [address]    Report Status 12/24/2018 FINAL  Final  Culture, blood (routine x 2)     Status: None   Collection Time: 12/19/18  9:26 AM  Result Value Ref Range Status   Specimen Description BLOOD RIGHT THUMB  Final   Special Requests AEROBIC BOTTLE ONLY Blood Culture adequate volume  Final   Culture   Final    NO GROWTH 5 DAYS Performed at [facility], [address], [place], [place] [address]    Report Status 12/24/2018 FINAL  Final  MRSA PCR Screening     Status: None   Collection Time: 12/21/18  8:38 AM  Result Value Ref Range Status   MRSA by PCR NEGATIVE NEGATIVE Final    Comment:        The GeneXpert MRSA Assay (FDA approved for NASAL specimens only), is one component of a comprehensive MRSA colonization surveillance program. It is not intended to diagnose MRSA infection nor to guide or monitor treatment for MRSA infections. Performed at [facility], [address], [place], [place] [address]   C difficile quick scan w PCR reflex     Status: None   Collection Time: 12/24/18  1:54 AM  Result Value Ref Range Status   C Diff antigen NEGATIVE NEGATIVE Final   C Diff toxin NEGATIVE NEGATIVE Final   C Diff interpretation No C. difficile detected.  Final    Comment: Performed at [facility], [address] [place], [place] [address]    Cliffton Asters, MD  [facility] [phone] pager   [phone] cell 12/26/2018, 9:57 AM

## 2018-12-26 NOTE — Progress Notes (Signed)
  Speech Language Pathology Treatment: Dysphagia  Patient Details Name: Darren Diaz MRN: [id number] DOB: 06-06-1986 Today's Date: 12/26/2018 Time: 3013-1438 SLP Time Calculation (min) (ACUTE ONLY): 25 min  Assessment / Plan / Recommendation Clinical Impression  Pt seen at bedside for follow up after BSE completed 12/23/2018. Parents were present, and reported poor intake, but that pt appeared to tolerate what he did accept.  Pt was given trials of puree (yogurt) and water. No obvious oral issues, or overt s/s aspiration. SLP provided education to pt's mother (father had stepped out at this point) regarding swallow safety, and safe swallow precautions. SLP will continue to follow briefly for diet tolerance and education. RN informed of pt reported pain level (7/10).    HPI HPI: 33 yo admitted 3/7 after fall related to EtOH abuse. Per chart patient became septic over the weekend. EtoH withdrawal. Developed acute respiratory distress 3/11 intubated and extubated morning of 3/13. CXR Atelectasis or pneumonia at the bases with layering pleural fluid.      SLP Plan  Continue with current plan of care       Recommendations  Diet recommendations: Regular;Thin liquid Liquids provided via: Cup;Straw Medication Administration: Whole meds with puree Supervision: Full supervision/cueing for compensatory strategies;Trained caregiver to feed patient Compensations: Minimize environmental distractions;Slow rate;Small sips/bites Postural Changes and/or Swallow Maneuvers: Seated upright 90 degrees;Upright 30-60 min after meal                Oral Care Recommendations: Oral care BID Follow up Recommendations: None SLP Visit Diagnosis: Dysphagia, unspecified (R13.10) Plan: Continue with current plan of care       GO               Celia B. Murvin [facility], CCC-SLP Speech Language Pathologist [phone]  Leigh Aurora 12/26/2018, 2:06 PM

## 2018-12-26 NOTE — Progress Notes (Signed)
[place] TEAM 1 - Stepdown/ICU TEAM  Darren Diaz  ZOX:[id number] DOB: [birth year] DOA: 12/17/2018 PCP: Patient, No Pcp Per    Brief Narrative:  32yo w/ a Hx of EtOH abuse and seizure who presented 12/17/18 after a friend heard him fall, and discovered he was seizing. Following admission he was found to have Group A Strep bacteremia, and required ICU transfer for severe EtOH withdrawal.   Significant Events: 3/7 admit s/p seizure 3/9 Group A Strep RLE cellulitis  3/9 TTE - EF 35% w/ diffuse hypokinesis  3/11 tx to ICU - worsening EtOH withdrawal, acute respiratory failure, sepsis - intubated 3/13 extubated 3/14 TRH resumed care  3/15 venous duplex R UE negative   Subjective: Tremulous. More agitated and confused today. No photophobia or meningismus. No sob. BP better controlled. Tachycardic. Febrile to 101.6 over night.   Assessment & Plan:  Sepsis due to Streptococcus pyogenes R LE cellulitis w/  bacteremia Care as suggested by ID - cont current abx - repeat cultures being sent   R antecubital/forearm erythema Cellulitis v/s contact dermatitis - cont current abx - avoid use of cuff or IV in this arm - no evidence of DVT on duplex   Acute Respiratory Failure with hypoxia Due to EtOH withdrawal + sepsis - required intubation - extubated 3/13 -respiratory status stable at this time on RA   Acute Encephalopathy  Multifactorial: sepsis, EtOH withdrawal, meds, seizure - CT head negative for acute findings - EEG no seizure activity - mental status declining today - go back up on clonidine as he appeared to do well w/ this - stop ativan as it is not working - add prn haldol - may require return to Precedex if agitation proves refractory   Alcohol Abuse with withdrawal Initially required Precedex drip w/ severe agitation - transitioned to clonidine withdrawal protocol - monitor closely with high risk for recurrent seizure activity/agitation  Seizure disorder Non-compliant with outpatient  Keppra - no evidence of recurrent seizure thus far   Newly diagnosed Systolic heart failure EF 35% on TTE - Cards following - Entresto added - mildly volume overloaded on exam - now net negative ~1700   Filed Weights   12/24/18 0500 12/25/18 0435 12/26/18 0500  Weight: 98.9 kg 98.8 kg 98.8 kg      Uncontrolled HTN BP not yet significantly improved - agitation and confusion likely contributing - clonidine increased back to high dose - nitro gtt started 3/15 and pt responding thus far   Acute Kidney Injury Renal US unremarkable - crt slowly improving - monitor trend  Hypokalemia Supplemented to goal of 4.0  Hypomagnesemia   Supplemented to goal of 2.0  Acute Liver injury with transaminitis  likely shock liver - RUQ US unremarkable for hepatic findings - LFTs now essentially normalized   Tobacco use disorder  DVT prophylaxis: lovenox  Code Status: FULL CODE Family Communication: Spoke with father at bedside Disposition Plan: cont to monitor in current bed due to high likelihood of recurrent severe withdrawal symptoms requiring precedex   Consultants:  Cardiology ID  PCCM  Antimicrobials:  PCN G 3/9 >  Objective: Blood pressure (!) 158/93, pulse (!) 126, temperature (!) 101.6 F (38.7 C), temperature source Axillary, resp. rate (!) 21, height  (1.778 m), weight 98.8 kg, SpO2 97 %.  Intake/Output Summary (Last 24 hours) at 12/26/2018 1019 Last data filed at 12/26/2018 0700 Gross per 24 hour  Intake 1181.83 ml  Output 3410 ml  Net -2228.17 ml   [employer]  12/24/18 0500 12/25/18 0435 12/26/18 0500  Weight: 98.9 kg 98.8 kg 98.8 kg    Examination: General: No acute respiratory distress - agitated - tremulous   Lungs: Clear to auscultation bilaterally w/o wheezing  Cardiovascular: tachycardic - regular - no M  Abdomen: NT/ND, soft, BS+, no mass  Extremities: 1+ pitting edema bilateral lower extremities w/o signif change - erythema of right lower  extremity has resolved - new patchy erythema of R UE and LUE noted today   CBC: Recent Labs  Lab 12/24/18 0616 12/25/18 0803 12/26/18 0421  WBC 7.2 7.4 7.3  HGB 10.7* 10.7* 11.4*  HCT 30.7* 31.3* 33.5*  MCV 99.0 100.0 103.7*  PLT 218 342 424*   Basic Metabolic Panel: Recent Labs  Lab 12/23/18 0234 12/24/18 0616 12/25/18 0428 12/26/18 0421  NA 140 137 137 139  K 4.4 3.1* 4.7 4.6  CL 111 107 113* 115*  CO2 22 19* 16* 17*  GLUCOSE 122* 105* 92 99  BUN 25* 19 17 16   CREATININE 1.54* 1.46* 1.38* 1.45*  CALCIUM 8.3* 8.4* 8.0* 8.4*  MG 1.6* 1.6* 2.1  --   PHOS 2.1* 3.1 2.8  --    GFR: Estimated Creatinine Clearance: 84.6 mL/min (A) (by C-G formula based on SCr of 1.45 mg/dL (H)).  Liver Function Tests: Recent Labs  Lab 12/20/18 0332 12/21/18 0335 12/25/18 0428 12/26/18 0421  AST 549* 158* 38 38  ALT 512* 285* 58* 46*  ALKPHOS 30* 45 49 47  BILITOT 1.6* 2.2* 1.5* 1.3*  PROT 5.6* 6.1* 6.1* 6.5  ALBUMIN 2.7* 2.6* 2.3* 2.3*    Recent Labs  Lab 12/19/18 1128 12/20/18 0332  AMMONIA 28 34    Coagulation Profile: Recent Labs  Lab 12/20/18 0332  INR 1.2     Recent Results (from the past 240 hour(s))  Respiratory Panel by PCR     Status: None   Collection Time: 12/18/18 12:18 PM  Result Value Ref Range Status   Adenovirus NOT DETECTED NOT DETECTED Final   Coronavirus 229E NOT DETECTED NOT DETECTED Final    Comment: (NOTE) The Coronavirus on the Respiratory Panel, DOES NOT test for the novel  Coronavirus (2019 nCoV)    Coronavirus HKU1 NOT DETECTED NOT DETECTED Final   Coronavirus NL63 NOT DETECTED NOT DETECTED Final   Coronavirus OC43 NOT DETECTED NOT DETECTED Final   Metapneumovirus NOT DETECTED NOT DETECTED Final   Rhinovirus / Enterovirus NOT DETECTED NOT DETECTED Final   Influenza A NOT DETECTED NOT DETECTED Final   Influenza B NOT DETECTED NOT DETECTED Final   Parainfluenza Virus 1 NOT DETECTED NOT DETECTED Final   Parainfluenza Virus 2 NOT  DETECTED NOT DETECTED Final   Parainfluenza Virus 3 NOT DETECTED NOT DETECTED Final   Parainfluenza Virus 4 NOT DETECTED NOT DETECTED Final   Respiratory Syncytial Virus NOT DETECTED NOT DETECTED Final   Bordetella pertussis NOT DETECTED NOT DETECTED Final   Chlamydophila pneumoniae NOT DETECTED NOT DETECTED Final   Mycoplasma pneumoniae NOT DETECTED NOT DETECTED Final    Comment: Performed at [facility], [address] [place], [place] [address]  Culture, blood (routine x 2)     Status: Abnormal   Collection Time: 12/18/18  1:00 PM  Result Value Ref Range Status   Specimen Description BLOOD LEFT HAND  Final   Special Requests   Final    BOTTLES DRAWN AEROBIC ONLY Blood Culture adequate volume   Culture  Setup Time   Final    GRAM POSITIVE  COCCI IN CHAINS AEROBIC BOTTLE ONLY CRITICAL RESULT CALLED TO, READ BACK BY AND VERIFIED WITH: J.LADFORD,PHARMD AT 0450 ON 12/19/18 BY G.MCADOO    Culture (A)  Final    GROUP A STREP (S.PYOGENES) ISOLATED HEALTH DEPARTMENT NOTIFIED Performed at [facility], [address], [place], [place] [address]    Report Status 12/21/2018 FINAL  Final   Organism ID, Bacteria GROUP A STREP (S.PYOGENES) ISOLATED  Final      Susceptibility   Group a strep (s.pyogenes) isolated - MIC*    PENICILLIN <=0.06 SENSITIVE Sensitive     CEFTRIAXONE <=0.12 SENSITIVE Sensitive     ERYTHROMYCIN 2 RESISTANT Resistant     LEVOFLOXACIN <=0.25 SENSITIVE Sensitive     VANCOMYCIN <=0.12 SENSITIVE Sensitive     * GROUP A STREP (S.PYOGENES) ISOLATED  Blood Culture ID Panel (Reflexed)     Status: Abnormal   Collection Time: 12/18/18  1:00 PM  Result Value Ref Range Status   Enterococcus species NOT DETECTED NOT DETECTED Final   Listeria monocytogenes NOT DETECTED NOT DETECTED Final   Staphylococcus species NOT DETECTED NOT DETECTED Final   Staphylococcus aureus (BCID) NOT DETECTED NOT DETECTED Final   Streptococcus species DETECTED (A) NOT DETECTED Final     Comment: CRITICAL RESULT CALLED TO, READ BACK BY AND VERIFIED WITH: J.LADFORD,PHARMD AT 0450 ON 12/19/18 BY G.MCADOO    Streptococcus agalactiae NOT DETECTED NOT DETECTED Final   Streptococcus pneumoniae NOT DETECTED NOT DETECTED Final   Streptococcus pyogenes DETECTED (A) NOT DETECTED Final    Comment: CRITICAL RESULT CALLED TO, READ BACK BY AND VERIFIED WITH: J.LADFORD,PHARMD AT 0450 ON 12/19/18 BY G.MCADOO    Acinetobacter baumannii NOT DETECTED NOT DETECTED Final   Enterobacteriaceae species NOT DETECTED NOT DETECTED Final   Enterobacter cloacae complex NOT DETECTED NOT DETECTED Final   Escherichia coli NOT DETECTED NOT DETECTED Final   Klebsiella oxytoca NOT DETECTED NOT DETECTED Final   Klebsiella pneumoniae NOT DETECTED NOT DETECTED Final   Proteus species NOT DETECTED NOT DETECTED Final   Serratia marcescens NOT DETECTED NOT DETECTED Final   Haemophilus influenzae NOT DETECTED NOT DETECTED Final   Neisseria meningitidis NOT DETECTED NOT DETECTED Final   Pseudomonas aeruginosa NOT DETECTED NOT DETECTED Final   Candida albicans NOT DETECTED NOT DETECTED Final   Candida glabrata NOT DETECTED NOT DETECTED Final   Candida krusei NOT DETECTED NOT DETECTED Final   Candida parapsilosis NOT DETECTED NOT DETECTED Final   Candida tropicalis NOT DETECTED NOT DETECTED Final    Comment: Performed at [facility], [address], [place], [place] [address]  Culture, blood (routine x 2)     Status: Abnormal   Collection Time: 12/18/18  1:20 PM  Result Value Ref Range Status   Specimen Description BLOOD RIGHT HAND  Final   Special Requests   Final    BOTTLES DRAWN AEROBIC ONLY Blood Culture results may not be optimal due to an inadequate volume of blood received in culture bottles   Culture  Setup Time   Final    GRAM POSITIVE COCCI IN CHAINS AEROBIC BOTTLE ONLY CRITICAL VALUE NOTED.  VALUE IS CONSISTENT WITH PREVIOUSLY REPORTED AND CALLED VALUE.    Culture (A)  Final    GROUP A STREP  (S.PYOGENES) ISOLATED SUSCEPTIBILITIES PERFORMED ON PREVIOUS CULTURE WITHIN THE LAST 5 DAYS. HEALTH DEPARTMENT NOTIFIED Performed at [facility], [address] [place] [place], [place] [address]    Report Status 12/21/2018 FINAL  Final  Gastrointestinal Panel by  PCR , Stool     Status: None   Collection Time: 12/18/18  3:19 PM  Result Value Ref Range Status   Campylobacter species NOT DETECTED NOT DETECTED Final   Plesimonas shigelloides NOT DETECTED NOT DETECTED Final   Salmonella species NOT DETECTED NOT DETECTED Final   Yersinia enterocolitica NOT DETECTED NOT DETECTED Final   Vibrio species NOT DETECTED NOT DETECTED Final   Vibrio cholerae NOT DETECTED NOT DETECTED Final   Enteroaggregative E coli (EAEC) NOT DETECTED NOT DETECTED Final   Enteropathogenic E coli (EPEC) NOT DETECTED NOT DETECTED Final   Enterotoxigenic E coli (ETEC) NOT DETECTED NOT DETECTED Final   Shiga like toxin producing E coli (STEC) NOT DETECTED NOT DETECTED Final   Shigella/Enteroinvasive E coli (EIEC) NOT DETECTED NOT DETECTED Final   Cryptosporidium NOT DETECTED NOT DETECTED Final   Cyclospora cayetanensis NOT DETECTED NOT DETECTED Final   Entamoeba histolytica NOT DETECTED NOT DETECTED Final   Giardia lamblia NOT DETECTED NOT DETECTED Final   Adenovirus F40/41 NOT DETECTED NOT DETECTED Final   Astrovirus NOT DETECTED NOT DETECTED Final   Norovirus GI/GII NOT DETECTED NOT DETECTED Final   Rotavirus A NOT DETECTED NOT DETECTED Final   Sapovirus (I, II, IV, and V) NOT DETECTED NOT DETECTED Final    Comment: Performed at [facility], [address] [place], [place] [address]  Culture, blood (routine x 2)     Status: None   Collection Time: 12/19/18  9:13 AM  Result Value Ref Range Status   Specimen Description BLOOD RIGHT ANTECUBITAL  Final   Special Requests AEROBIC BOTTLE ONLY Blood Culture adequate volume  Final   Culture   Final    NO GROWTH 5 DAYS Performed at [facility],  [address] [place], [place] [address]    Report Status 12/24/2018 FINAL  Final  Culture, blood (routine x 2)     Status: None   Collection Time: 12/19/18  9:26 AM  Result Value Ref Range Status   Specimen Description BLOOD RIGHT THUMB  Final   Special Requests AEROBIC BOTTLE ONLY Blood Culture adequate volume  Final   Culture   Final    NO GROWTH 5 DAYS Performed at [facility], [address], [place], [place] [address]    Report Status 12/24/2018 FINAL  Final  MRSA PCR Screening     Status: None   Collection Time: 12/21/18  8:38 AM  Result Value Ref Range Status   MRSA by PCR NEGATIVE NEGATIVE Final    Comment:        The GeneXpert MRSA Assay (FDA approved for NASAL specimens only), is one component of a comprehensive MRSA colonization surveillance program. It is not intended to diagnose MRSA infection nor to guide or monitor treatment for MRSA infections. Performed at [facility], [address] [place], [place] [address]   C difficile quick scan w PCR reflex     Status: None   Collection Time: 12/24/18  1:54 AM  Result Value Ref Range Status   C Diff antigen NEGATIVE NEGATIVE Final   C Diff toxin NEGATIVE NEGATIVE Final   C Diff interpretation No C. difficile detected.  Final    Comment: Performed at [facility], [address] [place], [place] [address]     Scheduled Meds: . carvedilol  12.5 mg Oral BID WC  . cloNIDine  0.1 mg Oral BH-qamhs   Followed by  . [START ON 12/28/2018] cloNIDine  0.1 mg Oral QAC breakfast  . enoxaparin (LOVENOX) injection  40 mg Subcutaneous Q24H  . folic acid  1 mg Oral Daily  . gabapentin  300 mg Oral BID  . levETIRAcetam  1,000 mg Oral BID  . multivitamin with minerals  1 tablet Oral Daily  . nicotine  21 mg Transdermal Daily  . pantoprazole  40 mg Oral Daily  . vitamin B-6  100 mg Oral Daily  . QUEtiapine  50 mg Oral QHS  . sacubitril-valsartan  1 tablet Oral BID  . thiamine  100 mg Oral Daily     LOS: 9  days   Lonia Blood, MD [facility] Office  [phone] Pager - Text Page per Amion  If 7PM-7AM, please contact night-coverage per Amion 12/26/2018, 10:19 AM

## 2018-12-26 NOTE — Progress Notes (Signed)
Progress Note  Patient Name: Darren Diaz Date of Encounter: 12/26/2018  Primary Cardiologist: No primary care provider on file.   Subjective   The patient is sedated, unable to provide any history.  Inpatient Medications    Scheduled Meds:  carvedilol  12.5 mg Oral BID WC   cloNIDine  0.1 mg Oral QID   Followed by   Melene Muller ON 12/28/2018] cloNIDine  0.1 mg Oral BH-qamhs   Followed by   Melene Muller ON 12/31/2018] cloNIDine  0.1 mg Oral QAC breakfast   enoxaparin (LOVENOX) injection  40 mg Subcutaneous Q24H   folic acid  1 mg Oral Daily   gabapentin  300 mg Oral BID   levETIRAcetam  1,000 mg Oral BID   multivitamin with minerals  1 tablet Oral Daily   nicotine  21 mg Transdermal Daily   pantoprazole  40 mg Oral Daily   vitamin B-6  100 mg Oral Daily   sacubitril-valsartan  1 tablet Oral BID   thiamine  100 mg Oral Daily   Continuous Infusions:  lactated ringers 10 mL/hr at 12/26/18 1131   nitroGLYCERIN 20 mcg/min (12/26/18 1131)   penicillin g continuous IV infusion 12 Million Units (12/26/18 0502)   PRN Meds: acetaminophen, bisacodyl, dicyclomine, Gerhardt's butt cream, haloperidol lactate, hydrALAZINE, levalbuterol, loperamide, [DISCONTINUED] ondansetron **OR** ondansetron (ZOFRAN) IV, oxyCODONE, phenol, sennosides   Vital Signs    Vitals:   12/26/18 1015 12/26/18 1030 12/26/18 1045 12/26/18 1200  BP: (!) 150/71 132/63 (!) 116/57   Pulse: (!) 128 (!) 115 (!) 113   Resp: 20 18 (!) 29   Temp:    99.9 F (37.7 C)  TempSrc:    Axillary  SpO2: 97% 96% 92%   Weight:      Height:        Intake/Output Summary (Last 24 hours) at 12/26/2018 1355 Last data filed at 12/26/2018 1131 Gross per 24 hour  Intake 1267.5 ml  Output 3810 ml  Net -2542.5 ml   Last 3 Weights 12/26/2018 12/25/2018 12/24/2018  Weight (lbs) 217 lb 13 oz 217 lb 13 oz 218 lb 0.6 oz  Weight (kg) 98.8 kg 98.8 kg 98.9 kg      Telemetry    Normal sinus rhythm- Personally  Reviewed   Physical Exam  Sedated, in no distress GEN: No acute distress.   Neck: No JVD Cardiac: RRR, no murmurs, rubs, or gallops.  Respiratory:  Coarse breath sounds bilaterally. GI: Soft, nontender, non-distended  MS: No edema; No deformity. Neuro:  Nonfocal  Psych: Sedated  Labs    Chemistry Recent Labs  Lab 12/21/18 0335  12/24/18 0616 12/25/18 0428 12/26/18 0421  NA 140   < > 137 137 139  K 4.3   < > 3.1* 4.7 4.6  CL 114*   < > 107 113* 115*  CO2 20*   < > 19* 16* 17*  GLUCOSE 93   < > 105* 92 99  BUN 18   < > 19 17 16   CREATININE 1.55*   < > 1.46* 1.38* 1.45*  CALCIUM 8.8*   < > 8.4* 8.0* 8.4*  PROT 6.1*  --   --  6.1* 6.5  ALBUMIN 2.6*  --   --  2.3* 2.3*  AST 158*  --   --  38 38  ALT 285*  --   --  58* 46*  ALKPHOS 45  --   --  49 47  BILITOT 2.2*  --   --  1.5* 1.3*  GFRNONAA 58*   < > >60 >60 >60  GFRAA >60   < > >60 >60 >60  ANIONGAP 6   < > 11 8 7    < > = values in this interval not displayed.     Hematology Recent Labs  Lab 12/24/18 0616 12/25/18 0803 12/26/18 0421  WBC 7.2 7.4 7.3  RBC 3.10* 3.13*   3.13* 3.23*  HGB 10.7* 10.7* 11.4*  HCT 30.7* 31.3* 33.5*  MCV 99.0 100.0 103.7*  MCH 34.5* 34.2* 35.3*  MCHC 34.9 34.2 34.0  RDW 12.6 12.7 13.2  PLT 218 342 424*    Cardiac EnzymesNo results for input(s): TROPONINI in the last 168 hours. No results for input(s): TROPIPOC in the last 168 hours.   BNP Recent Labs  Lab 12/21/18 0738  BNP 3,126.8*     DDimer No results for input(s): DDIMER in the last 168 hours.   Radiology    Dg Chest 1 View  Result Date: 12/26/2018 CLINICAL DATA:  Fever.  Extubated. EXAM: CHEST  1 VIEW COMPARISON:  12/23/2018 FINDINGS: Endotracheal tube and nasogastric tube have been removed. Improved but mild and persistent infiltrate/atelectasis in the lower lobes left more than right. No worsening or new finding. IMPRESSION: Endotracheal tube and nasogastric tube removed. Better aeration of the lower lungs. Mild  persistent atelectasis/infiltrate in the lower lobes, left more than right. Electronically Signed   By: Paulina Fusi M.D.   On: 12/26/2018 10:43   Vas [place] Upper Extremity Venous Duplex  Result Date: 12/25/2018 UPPER VENOUS STUDY  Indications: Swelling Limitations: Patient positioning, wrist restraints. Performing Technologist: Chanda Busing RVT  Examination Guidelines: A complete evaluation includes B-mode imaging, spectral Doppler, color Doppler, and power Doppler as needed of all accessible portions of each vessel. Bilateral testing is considered an integral part of a complete examination. Limited examinations for reoccurring indications may be performed as noted.  Right Findings: +----------+------------+---------+-----------+----------+--------------+  RIGHT      Compressible Phasicity Spontaneous Properties    Summary      +----------+------------+---------+-----------+----------+--------------+  IJV            Full        Yes        Yes                                +----------+------------+---------+-----------+----------+--------------+  Subclavian     Full        Yes        Yes                                +----------+------------+---------+-----------+----------+--------------+  Axillary       Full        Yes        Yes                                +----------+------------+---------+-----------+----------+--------------+  Brachial       Full        Yes        Yes                                +----------+------------+---------+-----------+----------+--------------+  Radial         Full                                                      +----------+------------+---------+-----------+----------+--------------+  Ulnar                                                    Not visualized  +----------+------------+---------+-----------+----------+--------------+  Cephalic       Full                                                       +----------+------------+---------+-----------+----------+--------------+  Basilic        Full                                                      +----------+------------+---------+-----------+----------+--------------+  Left Findings: +----------+------------+---------+-----------+----------+-------+  LEFT       Compressible Phasicity Spontaneous Properties Summary  +----------+------------+---------+-----------+----------+-------+  Subclavian     Full        Yes        Yes                         +----------+------------+---------+-----------+----------+-------+  Summary:  Right: No evidence of deep vein thrombosis in the upper extremity. No evidence of superficial vein thrombosis in the upper extremity.  Left: No evidence of thrombosis in the subclavian.  *See table(s) above for measurements and observations.    Preliminary     Cardiac Studies   Echo: 1. The left ventricle has a visually estimated ejection fraction of of 35%. The cavity size was mildly dilated. Left ventricular diastolic Doppler parameters are consistent with impaired relaxation. Left ventricular diffuse hypokinesis.  2. The right ventricle has normal systolic function. The cavity was normal. There is no increase in right ventricular wall thickness.  3. No evidence of mitral valve stenosis. Trivial mitral regurgitation.  4. The aortic valve is tricuspid no stenosis of the aortic valve.  5. The aortic root and ascending aorta are normal in size and structure.  6. The inferior vena cava was dilated in size with <50% respiratory variability. No complete TR doppler jet so unable to estimate PA systolic pressure.  FINDINGS  Left Ventricle: The left ventricle has a visually estimated ejection fraction of of 35%. The cavity size was mildly dilated. There is no increase in left ventricular wall thickness. Left ventricular diastolic Doppler parameters are consistent with  impaired relaxation. Left ventricular diffuse hypokinesis. Right  Ventricle: The right ventricle has normal systolic function. The cavity was normal. There is no increase in right ventricular wall thickness. Left Atrium: left atrial size was normal in size Right Atrium: right atrial size was normal in size. Right atrial pressure is estimated at 15 mmHg. Interatrial Septum: No atrial level shunt detected by color flow Doppler. Pericardium: There is no evidence of pericardial effusion. Mitral Valve: The mitral valve is normal in structure. Mitral valve regurgitation is trivial by color flow Doppler. No evidence of mitral valve stenosis. Tricuspid Valve: The tricuspid valve is normal in structure. Tricuspid valve regurgitation is trivial by color flow Doppler. Aortic Valve: The aortic valve is tricuspid Aortic valve regurgitation was not visualized by color flow Doppler. There is  no stenosis of the aortic valve. Pulmonic Valve: The pulmonic valve was normal in structure. Pulmonic valve regurgitation is not visualized by color flow Doppler. Aorta: The aortic root and ascending aorta are normal in size and structure. Venous: The inferior vena cava is dilated in size with less than 50% respiratory variability.     Patient Profile     33 y.o. male with a hx of PDA repair (age 1 at [facility] state), seizure disorder due heavy alcohol drinking and tobacco smokingwho is being seen for the evaluation of Low EFat the request of Dr. Jerral Ralph.  Assessment & Plan    1. 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  I spoke to his mother at the bedside about the importance of complete alcohol cessation in the future.  She understands and has counseled him extensively in the past without success.  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 HTN, in setting of alcohol withdrawal.  Now normotensive after receiving benzodiazepines and IV nitroglycerin.  We will continue current management and wean nitroglycerin off as tolerated.  Ultimately should be treated  with beta-blocker and Entresto in the setting of his severe LV dysfunction.  We will follow with you as he progresses.  For questions or updates, please contact [facility] Please consult [email or web address] for contact info under        Signed, Tonny Bollman, MD  12/26/2018, 1:55 PM

## 2018-12-27 LAB — BASIC METABOLIC PANEL
Anion gap: 7 (ref 5–15)
BUN: 13 mg/dL (ref 6–20)
CHLORIDE: 110 mmol/L (ref 98–111)
CO2: 21 mmol/L — AB (ref 22–32)
Calcium: 8.4 mg/dL — ABNORMAL LOW (ref 8.9–10.3)
Creatinine, Ser: 1.48 mg/dL — ABNORMAL HIGH (ref 0.61–1.24)
GFR calc Af Amer: >60 mL/min (ref >60)
GFR calc non Af Amer: >60 mL/min (ref >60)
Glucose, Bld: 105 mg/dL — ABNORMAL HIGH (ref 70–99)
Potassium: 3.9 mmol/L (ref 3.5–5.1)
Sodium: 138 mmol/L (ref 135–145)

## 2018-12-27 LAB — CBC
HEMATOCRIT: 29.9 % — AB (ref 39.0–52.0)
Hemoglobin: 10.1 g/dL — ABNORMAL LOW (ref 13.0–17.0)
MCH: 34.4 pg — ABNORMAL HIGH (ref 26.0–34.0)
MCHC: 33.8 g/dL (ref 30.0–36.0)
MCV: 101.7 fL — ABNORMAL HIGH (ref 80.0–100.0)
Platelets: 480 10*3/uL — ABNORMAL HIGH (ref 150–400)
RBC: 2.94 MIL/uL — ABNORMAL LOW (ref 4.22–5.81)
RDW: 12.6 % (ref 11.5–15.5)
WBC: 7.6 10*3/uL (ref 4.0–10.5)
nRBC: 0 % (ref 0.0–0.2)

## 2018-12-27 LAB — CBC WITH DIFFERENTIAL/PLATELET
Abs Immature Granulocytes: 0.09 10*3/uL — ABNORMAL HIGH (ref 0.00–0.07)
Basophils Absolute: 0.1 10*3/uL (ref 0.0–0.1)
Basophils Relative: 1 %
Eosinophils Absolute: 0 10*3/uL (ref 0.0–0.5)
Eosinophils Relative: 1 %
HCT: 30.6 % — ABNORMAL LOW (ref 39.0–52.0)
Hemoglobin: 10.1 g/dL — ABNORMAL LOW (ref 13.0–17.0)
Immature Granulocytes: 1 %
Lymphocytes Relative: 18 %
Lymphs Abs: 1.3 10*3/uL (ref 0.7–4.0)
MCH: 34.1 pg — ABNORMAL HIGH (ref 26.0–34.0)
MCHC: 33 g/dL (ref 30.0–36.0)
MCV: 103.4 fL — ABNORMAL HIGH (ref 80.0–100.0)
Monocytes Absolute: 0.6 10*3/uL (ref 0.1–1.0)
Monocytes Relative: 9 %
NEUTROS ABS: 5.3 10*3/uL (ref 1.7–7.7)
Neutrophils Relative %: 70 %
Platelets: 490 10*3/uL — ABNORMAL HIGH (ref 150–400)
RBC: 2.96 MIL/uL — ABNORMAL LOW (ref 4.22–5.81)
RDW: 12.8 % (ref 11.5–15.5)
WBC: 7.5 10*3/uL (ref 4.0–10.5)
nRBC: 0 % (ref 0.0–0.2)

## 2018-12-27 LAB — URINE CULTURE: Culture: <10000 — AB

## 2018-12-27 LAB — PHOSPHORUS: Phosphorus: 4 mg/dL (ref 2.5–4.6)

## 2018-12-27 LAB — MAGNESIUM: Magnesium: 1.7 mg/dL (ref 1.7–2.4)

## 2018-12-27 MED ORDER — MAGNESIUM SULFATE 2 GM/50ML IV SOLN
2.0000 g | Freq: Once | INTRAVENOUS | Status: AC
  Administered 2018-12-27: 2 g via INTRAVENOUS
  Filled 2018-12-27: qty 50

## 2018-12-27 NOTE — Progress Notes (Signed)
[facility] TEAM 1 - Stepdown/ICU TEAM  Jeanmarc Garciamartinez  GUR:[id number] DOB: 1985/11/17 DOA: 12/17/2018 PCP: Patient, No Pcp Per    Brief Narrative:  33yo w/ a Hx of EtOH abuse and seizure who presented 12/17/18 after a friend heard him fall, and discovered he was seizing. Following admission he was found to have Group A Strep bacteremia, and required ICU transfer for severe EtOH withdrawal.   Significant Events: 3/7 admit s/p seizure 3/9 Group A Strep RLE cellulitis  3/9 TTE - EF 35% w/ diffuse hypokinesis  3/11 tx to ICU - worsening EtOH withdrawal, acute respiratory failure, sepsis - intubated 3/13 extubated 3/14 TRH resumed care  3/15 venous duplex R UE - negative   Subjective: Much improved today. Calm and sleeping. HR normalized. BP well controlled. Is out of restraints. Continues to have fevers, w/ Tm 101.6 last 24hrs.   Assessment & Plan:  Sepsis due to Streptococcus pyogenes R LE cellulitis w/  bacteremia Care as suggested by ID - cont current abx - repeat cultures 3/16 negative thus far   R antecubital/forearm erythema Cellulitis v/s contact dermatitis - cont current abx - avoid use of cuff or IV in this arm - no evidence of DVT on duplex - improved on exam today   Acute Respiratory Failure with hypoxia Due to EtOH withdrawal + sepsis - required intubation - extubated 3/13 - respiratory status stable on RA   Acute Encephalopathy  Multifactorial: sepsis, EtOH withdrawal, meds, seizure - CT head negative for acute findings - EEG no seizure activity - mental status much more stable today back on high dose frequent clonidine - stopped ativan as it was not working - added prn haldol   Alcohol Abuse with withdrawal Initially required Precedex drip w/ severe agitation - transitioned to clonidine withdrawal protocol - monitor closely with high risk for recurrent seizure activity/agitation  Seizure disorder Non-compliant with outpatient Keppra - no evidence of recurrent seizure thus  far   Newly diagnosed Systolic heart failure EF 35% on TTE - Cards following - Entresto added - mildly volume overloaded on exam - now net negative ~3200   Filed Weights   12/25/18 0435 12/26/18 0500 12/27/18 0314  Weight: 98.8 kg 98.8 kg 96.1 kg      Uncontrolled HTN BP now well controlled - wean off nitro gtt as able   Acute Kidney Injury Renal US unremarkable - crt stable   Hypomagnesemia   Supplement to goal of 2.0  Acute Liver injury with transaminitis  likely shock liver - RUQ US unremarkable for hepatic findings - LFTs now essentially normalized   Hx of PDA repair at age 40  Tobacco use disorder  DVT prophylaxis: lovenox  Code Status: FULL CODE Family Communication: Spoke with father and mother at bedside Disposition Plan: cont to monitor in current bed due to high likelihood of recurrent severe withdrawal symptoms requiring precedex   Consultants:  Cardiology ID  PCCM  Antimicrobials:  PCN G 3/9 >  Objective: Blood pressure 107/65, pulse 96, temperature (!) 101.6 F (38.7 C), temperature source Axillary, resp. rate 16, height 5\' 10"  (1.778 m), weight 96.1 kg, SpO2 95 %.  Intake/Output Summary (Last 24 hours) at 12/27/2018 1004 Last data filed at 12/27/2018 0900 Gross per 24 hour  Intake 1278.96 ml  Output 2920 ml  Net -1641.04 ml   Filed Weights   12/25/18 0435 12/26/18 0500 12/27/18 0314  Weight: 98.8 kg 98.8 kg 96.1 kg    Examination: General: No acute respiratory distress - calm/sedate  Lungs: Clear to auscultation bilaterally - no wheezing  Cardiovascular: RRR - no M or rub  Abdomen: NT/ND, soft, BS+, no mass  Extremities: 1+ pitting edema bilateral lower extremities w/o signif change - erythema of right lower extremity has resolved - new patchy erythema of R UE and LUE much improved   CBC: Recent Labs  Lab 12/25/18 0803 12/26/18 0421 12/27/18 0219  WBC 7.4 7.3 7.6  HGB 10.7* 11.4* 10.1*  HCT 31.3* 33.5* 29.9*  MCV 100.0 103.7*  101.7*  PLT 342 424* 480*   Basic Metabolic Panel: Recent Labs  Lab 12/24/18 0616 12/25/18 0428 12/26/18 0421 12/27/18 0219  NA 137 137 139 138  K 3.1* 4.7 4.6 3.9  CL 107 113* 115* 110  CO2 19* 16* 17* 21*  GLUCOSE 105* 92 99 105*  BUN CREATININE 1.46* 1.38* 1.45* 1.48*  CALCIUM 8.4* 8.0* 8.4* 8.4*  MG 1.6* 2.1  --  1.7  PHOS 3.1 2.8  --  4.0   GFR: Estimated Creatinine Clearance: 81.8 mL/min (A) (by C-G formula based on SCr of 1.48 mg/dL (H)).  Liver Function Tests: Recent Labs  Lab 12/21/18 0335 12/25/18 0428 12/26/18 0421  AST 158* 38 38  ALT 285* 58* 46*  ALKPHOS 45 49 47  BILITOT 2.2* 1.5* 1.3*  PROT 6.1* 6.1* 6.5  ALBUMIN 2.6* 2.3* 2.3*     Recent Results (from the past 240 hour(s))  Respiratory Panel by PCR     Status: None   Collection Time: 12/18/18 12:18 PM  Result Value Ref Range Status   Adenovirus NOT DETECTED NOT DETECTED Final   Coronavirus 229E NOT DETECTED NOT DETECTED Final    Comment: (NOTE) The Coronavirus on the Respiratory Panel, DOES NOT test for the novel  Coronavirus (2019 nCoV)    Coronavirus HKU1 NOT DETECTED NOT DETECTED Final   Coronavirus NL63 NOT DETECTED NOT DETECTED Final   Coronavirus OC43 NOT DETECTED NOT DETECTED Final   Metapneumovirus NOT DETECTED NOT DETECTED Final   Rhinovirus / Enterovirus NOT DETECTED NOT DETECTED Final   Influenza A NOT DETECTED NOT DETECTED Final   Influenza B NOT DETECTED NOT DETECTED Final   Parainfluenza Virus 1 NOT DETECTED NOT DETECTED Final   Parainfluenza Virus 2 NOT DETECTED NOT DETECTED Final   Parainfluenza Virus 3 NOT DETECTED NOT DETECTED Final   Parainfluenza Virus 4 NOT DETECTED NOT DETECTED Final   Respiratory Syncytial Virus NOT DETECTED NOT DETECTED Final   Bordetella pertussis NOT DETECTED NOT DETECTED Final   Chlamydophila pneumoniae NOT DETECTED NOT DETECTED Final   Mycoplasma pneumoniae NOT DETECTED NOT DETECTED Final    Comment: Performed at [facility], [address], [place], [place] [address]  Culture, blood (routine x 2)     Status: Abnormal   Collection Time: 12/18/18  1:00 PM  Result Value Ref Range Status   Specimen Description BLOOD LEFT HAND  Final   Special Requests   Final    BOTTLES DRAWN AEROBIC ONLY Blood Culture adequate volume   Culture  Setup Time   Final    GRAM POSITIVE COCCI IN CHAINS AEROBIC BOTTLE ONLY CRITICAL RESULT CALLED TO, READ BACK BY AND VERIFIED WITH: J.LADFORD,PHARMD AT 0450 ON 12/19/18 BY G.MCADOO    Culture (A)  Final    GROUP A STREP (S.PYOGENES) ISOLATED HEALTH DEPARTMENT NOTIFIED Performed at [facility], [address], [place], [place] [address]    Report Status 12/21/2018 FINAL  Final  Organism ID, Bacteria GROUP A STREP (S.PYOGENES) ISOLATED  Final      Susceptibility   Group a strep (s.pyogenes) isolated - MIC*    PENICILLIN <=0.06 SENSITIVE Sensitive     CEFTRIAXONE <=0.12 SENSITIVE Sensitive     ERYTHROMYCIN 2 RESISTANT Resistant     LEVOFLOXACIN <=0.25 SENSITIVE Sensitive     VANCOMYCIN <=0.12 SENSITIVE Sensitive     * GROUP A STREP (S.PYOGENES) ISOLATED  Blood Culture ID Panel (Reflexed)     Status: Abnormal   Collection Time: 12/18/18  1:00 PM  Result Value Ref Range Status   Enterococcus species NOT DETECTED NOT DETECTED Final   Listeria monocytogenes NOT DETECTED NOT DETECTED Final   Staphylococcus species NOT DETECTED NOT DETECTED Final   Staphylococcus aureus (BCID) NOT DETECTED NOT DETECTED Final   Streptococcus species DETECTED (A) NOT DETECTED Final    Comment: CRITICAL RESULT CALLED TO, READ BACK BY AND VERIFIED WITH: J.LADFORD,PHARMD AT 0450 ON 12/19/18 BY G.MCADOO    Streptococcus agalactiae NOT DETECTED NOT DETECTED Final   Streptococcus pneumoniae NOT DETECTED NOT DETECTED Final   Streptococcus pyogenes DETECTED (A) NOT DETECTED Final    Comment: CRITICAL RESULT CALLED TO, READ BACK BY AND VERIFIED WITH: J.LADFORD,PHARMD AT 0450 ON 12/19/18 BY G.MCADOO     Acinetobacter baumannii NOT DETECTED NOT DETECTED Final   Enterobacteriaceae species NOT DETECTED NOT DETECTED Final   Enterobacter cloacae complex NOT DETECTED NOT DETECTED Final   Escherichia coli NOT DETECTED NOT DETECTED Final   Klebsiella oxytoca NOT DETECTED NOT DETECTED Final   Klebsiella pneumoniae NOT DETECTED NOT DETECTED Final   Proteus species NOT DETECTED NOT DETECTED Final   Serratia marcescens NOT DETECTED NOT DETECTED Final   Haemophilus influenzae NOT DETECTED NOT DETECTED Final   Neisseria meningitidis NOT DETECTED NOT DETECTED Final   Pseudomonas aeruginosa NOT DETECTED NOT DETECTED Final   Candida albicans NOT DETECTED NOT DETECTED Final   Candida glabrata NOT DETECTED NOT DETECTED Final   Candida krusei NOT DETECTED NOT DETECTED Final   Candida parapsilosis NOT DETECTED NOT DETECTED Final   Candida tropicalis NOT DETECTED NOT DETECTED Final    Comment: Performed at [facility], [address], [place], [place] [address]  Culture, blood (routine x 2)     Status: Abnormal   Collection Time: 12/18/18  1:20 PM  Result Value Ref Range Status   Specimen Description BLOOD RIGHT HAND  Final   Special Requests   Final    BOTTLES DRAWN AEROBIC ONLY Blood Culture results may not be optimal due to an inadequate volume of blood received in culture bottles   Culture  Setup Time   Final    GRAM POSITIVE COCCI IN CHAINS AEROBIC BOTTLE ONLY CRITICAL VALUE NOTED.  VALUE IS CONSISTENT WITH PREVIOUSLY REPORTED AND CALLED VALUE.    Culture (A)  Final    GROUP A STREP (S.PYOGENES) ISOLATED SUSCEPTIBILITIES PERFORMED ON PREVIOUS CULTURE WITHIN THE LAST 5 DAYS. HEALTH DEPARTMENT NOTIFIED Performed at [facility], [address], [place], [place] [address]    Report Status 12/21/2018 FINAL  Final  Gastrointestinal Panel by PCR , Stool     Status: None   Collection Time: 12/18/18  3:19 PM  Result Value Ref Range Status   Campylobacter species NOT DETECTED NOT DETECTED  Final   Plesimonas shigelloides NOT DETECTED NOT DETECTED Final   Salmonella species NOT DETECTED NOT DETECTED Final   Yersinia enterocolitica NOT DETECTED NOT DETECTED Final   Vibrio species NOT DETECTED  NOT DETECTED Final   Vibrio cholerae NOT DETECTED NOT DETECTED Final   Enteroaggregative E coli (EAEC) NOT DETECTED NOT DETECTED Final   Enteropathogenic E coli (EPEC) NOT DETECTED NOT DETECTED Final   Enterotoxigenic E coli (ETEC) NOT DETECTED NOT DETECTED Final   Shiga like toxin producing E coli (STEC) NOT DETECTED NOT DETECTED Final   Shigella/Enteroinvasive E coli (EIEC) NOT DETECTED NOT DETECTED Final   Cryptosporidium NOT DETECTED NOT DETECTED Final   Cyclospora cayetanensis NOT DETECTED NOT DETECTED Final   Entamoeba histolytica NOT DETECTED NOT DETECTED Final   Giardia lamblia NOT DETECTED NOT DETECTED Final   Adenovirus F40/41 NOT DETECTED NOT DETECTED Final   Astrovirus NOT DETECTED NOT DETECTED Final   Norovirus GI/GII NOT DETECTED NOT DETECTED Final   Rotavirus A NOT DETECTED NOT DETECTED Final   Sapovirus (I, II, IV, and V) NOT DETECTED NOT DETECTED Final    Comment: Performed at [facility], [address] [place], [place] [address]  Culture, blood (routine x 2)     Status: None   Collection Time: 12/19/18  9:13 AM  Result Value Ref Range Status   Specimen Description BLOOD RIGHT ANTECUBITAL  Final   Special Requests AEROBIC BOTTLE ONLY Blood Culture adequate volume  Final   Culture   Final    NO GROWTH 5 DAYS Performed at [facility], [address] [place], [place] [address]    Report Status 12/24/2018 FINAL  Final  Culture, blood (routine x 2)     Status: None   Collection Time: 12/19/18  9:26 AM  Result Value Ref Range Status   Specimen Description BLOOD RIGHT THUMB  Final   Special Requests AEROBIC BOTTLE ONLY Blood Culture adequate volume  Final   Culture   Final    NO GROWTH 5 DAYS Performed at [facility], [address]  [place], [place] [address]    Report Status 12/24/2018 FINAL  Final  MRSA PCR Screening     Status: None   Collection Time: 12/21/18  8:38 AM  Result Value Ref Range Status   MRSA by PCR NEGATIVE NEGATIVE Final    Comment:        The GeneXpert MRSA Assay (FDA approved for NASAL specimens only), is one component of a comprehensive MRSA colonization surveillance program. It is not intended to diagnose MRSA infection nor to guide or monitor treatment for MRSA infections. Performed at [facility], [address], [place], [place] [address]   C difficile quick scan w PCR reflex     Status: None   Collection Time: 12/24/18  1:54 AM  Result Value Ref Range Status   C Diff antigen NEGATIVE NEGATIVE Final   C Diff toxin NEGATIVE NEGATIVE Final   C Diff interpretation No C. difficile detected.  Final    Comment: Performed at [facility], [address] [place], [place] [address]  Culture, Urine     Status: Abnormal   Collection Time: 12/26/18 10:22 AM  Result Value Ref Range Status   Specimen Description URINE, CATHETERIZED  Final   Special Requests NONE  Final   Culture (A)  Final    <10,000 COLONIES/mL INSIGNIFICANT GROWTH Performed at [facility], [address], [place], [place] [address]    Report Status 12/27/2018 FINAL  Final  Culture, blood (routine x 2)     Status: None (Preliminary result)   Collection Time: 12/26/18 10:47 AM  Result Value Ref Range  Status   Specimen Description BLOOD RIGHT ANTECUBITAL  Final   Special Requests   Final    BOTTLES DRAWN AEROBIC ONLY Blood Culture adequate volume   Culture   Final    NO GROWTH < 12 HOURS Performed at [facility], [address] [place], [place] [address]    Report Status PENDING  Incomplete  Culture, blood (routine x 2)     Status: None (Preliminary result)   Collection Time: 12/26/18 10:53 AM  Result Value Ref Range Status   Specimen Description BLOOD RIGHT ARM  Final   Special Requests    Final    BOTTLES DRAWN AEROBIC ONLY Blood Culture adequate volume   Culture   Final    NO GROWTH < 12 HOURS Performed at [facility], [address], [place], [place] [address]    Report Status PENDING  Incomplete     Scheduled Meds: . carvedilol  12.5 mg Oral BID WC  . cloNIDine  0.1 mg Oral QID   Followed by  . [START ON 12/28/2018] cloNIDine  0.1 mg Oral BH-qamhs   Followed by  . [START ON 12/31/2018] cloNIDine  0.1 mg Oral QAC breakfast  . enoxaparin (LOVENOX) injection  40 mg Subcutaneous Q24H  . folic acid  1 mg Oral Daily  . gabapentin  300 mg Oral BID  . levETIRAcetam  1,000 mg Oral BID  . mouth rinse  15 mL Mouth Rinse BID  . multivitamin with minerals  1 tablet Oral Daily  . nicotine  21 mg Transdermal Daily  . pantoprazole  40 mg Oral Daily  . vitamin B-6  100 mg Oral Daily  . sacubitril-valsartan  1 tablet Oral BID  . thiamine  100 mg Oral Daily     LOS: 10 days   Lonia Blood, MD [facility] Office  [phone] Pager - Text Page per Amion  If 7PM-7AM, please contact night-coverage per Amion 12/27/2018, 10:04 AM

## 2018-12-27 NOTE — Progress Notes (Signed)
Patient ID: Darren Diaz, male   DOB: May 19, 1986, 33 y.o.   MRN: [id number]         [facility]  Date of Admission:  12/17/2018    Total days of antibiotics 10        Day 9 penicillin         ASSESSMENT: He presented with group A streptococcal bacteremia.  Repeat blood cultures on 12/19/2018 were negative.  He is still having some fever but he looks better today.  His UA is negative and repeat blood cultures are negative at 24 hours.  I do not see evidence of new pneumonia clinically or radiographically.  PLAN: 1. Continue penicillin  Active Problems:   Alcohol withdrawal seizure, with unspecified complication (HCC)   Bacterial infection due to streptococcus, group A   Nonischemic cardiomyopathy (HCC)   Pressure injury of skin   Right shoulder pain   Scheduled Meds: . carvedilol  12.5 mg Oral BID WC  . cloNIDine  0.1 mg Oral QID   Followed by  . [START ON 12/28/2018] cloNIDine  0.1 mg Oral BH-qamhs   Followed by  . [START ON 12/31/2018] cloNIDine  0.1 mg Oral QAC breakfast  . enoxaparin (LOVENOX) injection  40 mg Subcutaneous Q24H  . folic acid  1 mg Oral Daily  . gabapentin  300 mg Oral BID  . levETIRAcetam  1,000 mg Oral BID  . mouth rinse  15 mL Mouth Rinse BID  . multivitamin with minerals  1 tablet Oral Daily  . nicotine  21 mg Transdermal Daily  . pantoprazole  40 mg Oral Daily  . vitamin B-6  100 mg Oral Daily  . sacubitril-valsartan  1 tablet Oral BID  . thiamine  100 mg Oral Daily   Continuous Infusions: . lactated ringers 10 mL/hr at 12/27/18 1300  . nitroGLYCERIN Stopped (12/26/18 1134)  . penicillin g continuous IV infusion 41.7 mL/hr at 12/27/18 1100   PRN Meds:.acetaminophen, bisacodyl, dicyclomine, Gerhardt's butt cream, haloperidol lactate, hydrALAZINE, levalbuterol, loperamide, [DISCONTINUED] ondansetron **OR** ondansetron (ZOFRAN) IV, oxyCODONE, phenol, sennosides   Review of Systems: Review of Systems  Unable to perform ROS:  Mental acuity    No Known Allergies  OBJECTIVE: Vitals:   12/27/18 1100 12/27/18 1200 12/27/18 1300 12/27/18 1400  BP: (!) 141/70 (!) 165/84 (!) 174/77 (!) 168/93  Pulse: 87 81 87 84  Resp: 11 18 (!) 22 (!) 31  Temp:  100.2 F (37.9 C)    TempSrc:  Axillary    SpO2: 97% 98% 96% 98%  Weight:      Height:       Body mass index is 30.4 kg/m.  Physical Exam Constitutional:      Comments: He is resting quietly in bed.  His parents are visiting.  HENT:     Mouth/Throat:     Pharynx: No oropharyngeal exudate.  Eyes:     Conjunctiva/sclera: Conjunctivae normal.  Neck:     Musculoskeletal: Neck supple.  Cardiovascular:     Rate and Rhythm: Normal rate and regular rhythm.     Heart sounds: No murmur.  Pulmonary:     Effort: Pulmonary effort is normal.     Breath sounds: Normal breath sounds.  Abdominal:     Palpations: Abdomen is soft.     Comments: Still having diarrhea.     Lab Results Lab Results  Component Value Date   WBC 7.6 12/27/2018   WBC 7.5 12/27/2018   HGB 10.1 (L) 12/27/2018  HGB 10.1 (L) 12/27/2018   HCT 29.9 (L) 12/27/2018   HCT 30.6 (L) 12/27/2018   MCV 101.7 (H) 12/27/2018   MCV 103.4 (H) 12/27/2018   PLT 480 (H) 12/27/2018   PLT 490 (H) 12/27/2018    Lab Results  Component Value Date   CREATININE 1.48 (H) 12/27/2018   BUN 13 12/27/2018   NA 138 12/27/2018   K 3.9 12/27/2018   CL 110 12/27/2018   CO2 21 (L) 12/27/2018    Lab Results  Component Value Date   ALT 46 (H) 12/26/2018   AST 38 12/26/2018   ALKPHOS 47 12/26/2018   BILITOT 1.3 (H) 12/26/2018     Microbiology: Recent Results (from the past 240 hour(s))  Respiratory Panel by PCR     Status: None   Collection Time: 12/18/18 12:18 PM  Result Value Ref Range Status   Adenovirus NOT DETECTED NOT DETECTED Final   Coronavirus 229E NOT DETECTED NOT DETECTED Final    Comment: (NOTE) The Coronavirus on the Respiratory Panel, DOES NOT test for the novel  Coronavirus (2019 nCoV)     Coronavirus HKU1 NOT DETECTED NOT DETECTED Final   Coronavirus NL63 NOT DETECTED NOT DETECTED Final   Coronavirus OC43 NOT DETECTED NOT DETECTED Final   Metapneumovirus NOT DETECTED NOT DETECTED Final   Rhinovirus / Enterovirus NOT DETECTED NOT DETECTED Final   Influenza A NOT DETECTED NOT DETECTED Final   Influenza B NOT DETECTED NOT DETECTED Final   Parainfluenza Virus 1 NOT DETECTED NOT DETECTED Final   Parainfluenza Virus 2 NOT DETECTED NOT DETECTED Final   Parainfluenza Virus 3 NOT DETECTED NOT DETECTED Final   Parainfluenza Virus 4 NOT DETECTED NOT DETECTED Final   Respiratory Syncytial Virus NOT DETECTED NOT DETECTED Final   Bordetella pertussis NOT DETECTED NOT DETECTED Final   Chlamydophila pneumoniae NOT DETECTED NOT DETECTED Final   Mycoplasma pneumoniae NOT DETECTED NOT DETECTED Final    Comment: Performed at [facility], [address], [place], [place] [address]  Culture, blood (routine x 2)     Status: Abnormal   Collection Time: 12/18/18  1:00 PM  Result Value Ref Range Status   Specimen Description BLOOD LEFT HAND  Final   Special Requests   Final    BOTTLES DRAWN AEROBIC ONLY Blood Culture adequate volume   Culture  Setup Time   Final    GRAM POSITIVE COCCI IN CHAINS AEROBIC BOTTLE ONLY CRITICAL RESULT CALLED TO, READ BACK BY AND VERIFIED WITH: J.LADFORD,PHARMD AT 0450 ON 12/19/18 BY G.MCADOO    Culture (A)  Final    GROUP A STREP (S.PYOGENES) ISOLATED HEALTH DEPARTMENT NOTIFIED Performed at [facility], [address] [place], [place] [address]    Report Status 12/21/2018 FINAL  Final   Organism ID, Bacteria GROUP A STREP (S.PYOGENES) ISOLATED  Final      Susceptibility   Group a strep (s.pyogenes) isolated - MIC*    PENICILLIN <=0.06 SENSITIVE Sensitive     CEFTRIAXONE <=0.12 SENSITIVE Sensitive     ERYTHROMYCIN 2 RESISTANT Resistant     LEVOFLOXACIN <=0.25 SENSITIVE Sensitive     VANCOMYCIN <=0.12 SENSITIVE Sensitive     * GROUP A STREP  (S.PYOGENES) ISOLATED  Blood Culture ID Panel (Reflexed)     Status: Abnormal   Collection Time: 12/18/18  1:00 PM  Result Value Ref Range Status   Enterococcus species NOT DETECTED NOT DETECTED Final   Listeria monocytogenes NOT DETECTED NOT DETECTED Final   Staphylococcus species  NOT DETECTED NOT DETECTED Final   Staphylococcus aureus (BCID) NOT DETECTED NOT DETECTED Final   Streptococcus species DETECTED (A) NOT DETECTED Final    Comment: CRITICAL RESULT CALLED TO, READ BACK BY AND VERIFIED WITH: J.LADFORD,PHARMD AT 0450 ON 12/19/18 BY G.MCADOO    Streptococcus agalactiae NOT DETECTED NOT DETECTED Final   Streptococcus pneumoniae NOT DETECTED NOT DETECTED Final   Streptococcus pyogenes DETECTED (A) NOT DETECTED Final    Comment: CRITICAL RESULT CALLED TO, READ BACK BY AND VERIFIED WITH: J.LADFORD,PHARMD AT 0450 ON 12/19/18 BY G.MCADOO    Acinetobacter baumannii NOT DETECTED NOT DETECTED Final   Enterobacteriaceae species NOT DETECTED NOT DETECTED Final   Enterobacter cloacae complex NOT DETECTED NOT DETECTED Final   Escherichia coli NOT DETECTED NOT DETECTED Final   Klebsiella oxytoca NOT DETECTED NOT DETECTED Final   Klebsiella pneumoniae NOT DETECTED NOT DETECTED Final   Proteus species NOT DETECTED NOT DETECTED Final   Serratia marcescens NOT DETECTED NOT DETECTED Final   Haemophilus influenzae NOT DETECTED NOT DETECTED Final   Neisseria meningitidis NOT DETECTED NOT DETECTED Final   Pseudomonas aeruginosa NOT DETECTED NOT DETECTED Final   Candida albicans NOT DETECTED NOT DETECTED Final   Candida glabrata NOT DETECTED NOT DETECTED Final   Candida krusei NOT DETECTED NOT DETECTED Final   Candida parapsilosis NOT DETECTED NOT DETECTED Final   Candida tropicalis NOT DETECTED NOT DETECTED Final    Comment: Performed at [facility], [address] [place], [place] [address]  Culture, blood (routine x 2)     Status: Abnormal   Collection Time: 12/18/18  1:20 PM  Result Value  Ref Range Status   Specimen Description BLOOD RIGHT HAND  Final   Special Requests   Final    BOTTLES DRAWN AEROBIC ONLY Blood Culture results may not be optimal due to an inadequate volume of blood received in culture bottles   Culture  Setup Time   Final    GRAM POSITIVE COCCI IN CHAINS AEROBIC BOTTLE ONLY CRITICAL VALUE NOTED.  VALUE IS CONSISTENT WITH PREVIOUSLY REPORTED AND CALLED VALUE.    Culture (A)  Final    GROUP A STREP (S.PYOGENES) ISOLATED SUSCEPTIBILITIES PERFORMED ON PREVIOUS CULTURE WITHIN THE LAST 5 DAYS. HEALTH DEPARTMENT NOTIFIED Performed at [facility], [address] [place], [place] [address]    Report Status 12/21/2018 FINAL  Final  Gastrointestinal Panel by PCR , Stool     Status: None   Collection Time: 12/18/18  3:19 PM  Result Value Ref Range Status   Campylobacter species NOT DETECTED NOT DETECTED Final   Plesimonas shigelloides NOT DETECTED NOT DETECTED Final   Salmonella species NOT DETECTED NOT DETECTED Final   Yersinia enterocolitica NOT DETECTED NOT DETECTED Final   Vibrio species NOT DETECTED NOT DETECTED Final   Vibrio cholerae NOT DETECTED NOT DETECTED Final   Enteroaggregative E coli (EAEC) NOT DETECTED NOT DETECTED Final   Enteropathogenic E coli (EPEC) NOT DETECTED NOT DETECTED Final   Enterotoxigenic E coli (ETEC) NOT DETECTED NOT DETECTED Final   Shiga like toxin producing E coli (STEC) NOT DETECTED NOT DETECTED Final   Shigella/Enteroinvasive E coli (EIEC) NOT DETECTED NOT DETECTED Final   Cryptosporidium NOT DETECTED NOT DETECTED Final   Cyclospora cayetanensis NOT DETECTED NOT DETECTED Final   Entamoeba histolytica NOT DETECTED NOT DETECTED Final   Giardia lamblia NOT DETECTED NOT DETECTED Final   Adenovirus F40/41 NOT DETECTED NOT DETECTED Final   Astrovirus NOT DETECTED NOT DETECTED Final  Norovirus GI/GII NOT DETECTED NOT DETECTED Final   Rotavirus A NOT DETECTED NOT DETECTED Final   Sapovirus (I, II, IV, and V) NOT  DETECTED NOT DETECTED Final    Comment: Performed at [facility], [address] [place], [place] [address]  Culture, blood (routine x 2)     Status: None   Collection Time: 12/19/18  9:13 AM  Result Value Ref Range Status   Specimen Description BLOOD RIGHT ANTECUBITAL  Final   Special Requests AEROBIC BOTTLE ONLY Blood Culture adequate volume  Final   Culture   Final    NO GROWTH 5 DAYS Performed at [facility], [address] [place], [place] [address]    Report Status 12/24/2018 FINAL  Final  Culture, blood (routine x 2)     Status: None   Collection Time: 12/19/18  9:26 AM  Result Value Ref Range Status   Specimen Description BLOOD RIGHT THUMB  Final   Special Requests AEROBIC BOTTLE ONLY Blood Culture adequate volume  Final   Culture   Final    NO GROWTH 5 DAYS Performed at [facility], [address], [place], [place] [address]    Report Status 12/24/2018 FINAL  Final  MRSA PCR Screening     Status: None   Collection Time: 12/21/18  8:38 AM  Result Value Ref Range Status   MRSA by PCR NEGATIVE NEGATIVE Final    Comment:        The GeneXpert MRSA Assay (FDA approved for NASAL specimens only), is one component of a comprehensive MRSA colonization surveillance program. It is not intended to diagnose MRSA infection nor to guide or monitor treatment for MRSA infections. Performed at [facility], [address] [place], [place] [address]   C difficile quick scan w PCR reflex     Status: None   Collection Time: 12/24/18  1:54 AM  Result Value Ref Range Status   C Diff antigen NEGATIVE NEGATIVE Final   C Diff toxin NEGATIVE NEGATIVE Final   C Diff interpretation No C. difficile detected.  Final    Comment: Performed at [facility], [address] [place], [place] [address]  Culture, Urine     Status: Abnormal   Collection Time: 12/26/18 10:22 AM  Result Value Ref Range Status   Specimen Description URINE, CATHETERIZED  Final    Special Requests NONE  Final   Culture (A)  Final    <10,000 COLONIES/mL INSIGNIFICANT GROWTH Performed at [facility], [address], [place], [place] [address]    Report Status 12/27/2018 FINAL  Final  Culture, blood (routine x 2)     Status: None (Preliminary result)   Collection Time: 12/26/18 10:47 AM  Result Value Ref Range Status   Specimen Description BLOOD RIGHT ANTECUBITAL  Final   Special Requests   Final    BOTTLES DRAWN AEROBIC ONLY Blood Culture adequate volume   Culture   Final    NO GROWTH < 24 HOURS Performed at [facility], [address] [place], [place] [address]    Report Status PENDING  Incomplete  Culture, blood (routine x 2)     Status: None (Preliminary result)   Collection Time: 12/26/18 10:53 AM  Result Value Ref Range Status   Specimen Description BLOOD RIGHT ARM  Final   Special Requests   Final    BOTTLES DRAWN AEROBIC ONLY Blood Culture adequate volume   Culture   Final  NO GROWTH < 24 HOURS Performed at [facility], [address] [place], [place] [address]    Report Status PENDING  Incomplete    Cliffton Asters, MD [facility] for Infect[facility] Disease [facility] [phone] pager   [phone] cell 12/27/2018, 2:42 PM

## 2018-12-27 NOTE — Progress Notes (Signed)
  Speech Language Pathology Treatment: Dysphagia  Patient Details Name: Darren Diaz MRN: [id number] DOB: 04/01/1986 Today's Date: 12/27/2018 Time: [id number] SLP Time Calculation (min) (ACUTE ONLY): 15 min  Assessment / Plan / Recommendation Clinical Impression  SLP followed up to ensure diet tolerance. No overt s/sx of aspiration were noted with thin liquid or regular consistencies. Motivation for POs remains decreased, pt reports he was never a big breakfast eater. Continue regular thin liquid diet with safe swallow precautions. No further ST needs identified.    HPI HPI: 33 yo admitted 3/7 after fall related to EtOH abuse. Per chart patient became septic over the weekend. EtoH withdrawal. Developed acute respiratory distress 3/11 intubated and extubated morning of 3/13. CXR Atelectasis or pneumonia at the bases with layering pleural fluid.      SLP Plan  All goals met       Recommendations  Diet recommendations: Regular;Thin liquid Liquids provided via: Cup;Straw Medication Administration: Whole meds with puree Supervision: Full supervision/cueing for compensatory strategies;Trained caregiver to feed patient Compensations: Minimize environmental distractions;Slow rate;Small sips/bites Postural Changes and/or Swallow Maneuvers: Seated upright 90 degrees;Upright 30-60 min after meal                Oral Care Recommendations: Oral care BID Follow up Recommendations: None SLP Visit Diagnosis: Dysphagia, unspecified (R13.10) Plan: All goals met       GO                Darren Diaz E Tonantzin Mimnaugh MA, CCC-SLP Acute Rehab Speech Language Pathologist  12/27/2018, 9:13 AM

## 2018-12-28 DIAGNOSIS — R0602 Shortness of breath: Secondary | ICD-10-CM

## 2018-12-28 LAB — COMPREHENSIVE METABOLIC PANEL
ALT: 37 U/L (ref 0–44)
ANION GAP: 8 (ref 5–15)
AST: 34 U/L (ref 15–41)
Albumin: 2.2 g/dL — ABNORMAL LOW (ref 3.5–5.0)
Alkaline Phosphatase: 44 U/L (ref 38–126)
BUN: 13 mg/dL (ref 6–20)
CO2: 26 mmol/L (ref 22–32)
Calcium: 8.7 mg/dL — ABNORMAL LOW (ref 8.9–10.3)
Chloride: 103 mmol/L (ref 98–111)
Creatinine, Ser: 1.27 mg/dL — ABNORMAL HIGH (ref 0.61–1.24)
GFR calc non Af Amer: >60 mL/min (ref >60)
Glucose, Bld: 95 mg/dL (ref 70–99)
Potassium: 4.5 mmol/L (ref 3.5–5.1)
Sodium: 137 mmol/L (ref 135–145)
Total Bilirubin: 0.9 mg/dL (ref 0.3–1.2)
Total Protein: 6.3 g/dL — ABNORMAL LOW (ref 6.5–8.1)

## 2018-12-28 LAB — CBC
HCT: 29.8 % — ABNORMAL LOW (ref 39.0–52.0)
HEMOGLOBIN: 9.9 g/dL — AB (ref 13.0–17.0)
MCH: 34 pg (ref 26.0–34.0)
MCHC: 33.2 g/dL (ref 30.0–36.0)
MCV: 102.4 fL — ABNORMAL HIGH (ref 80.0–100.0)
Platelets: 528 10*3/uL — ABNORMAL HIGH (ref 150–400)
RBC: 2.91 MIL/uL — ABNORMAL LOW (ref 4.22–5.81)
RDW: 12 % (ref 11.5–15.5)
WBC: 7.7 10*3/uL (ref 4.0–10.5)
nRBC: 0 % (ref 0.0–0.2)

## 2018-12-28 LAB — MAGNESIUM: Magnesium: 1.9 mg/dL (ref 1.7–2.4)

## 2018-12-28 MED ORDER — ISOSORBIDE DINITRATE 10 MG PO TABS
10.0000 mg | ORAL_TABLET | Freq: Three times a day (TID) | ORAL | Status: DC
  Administered 2018-12-28 – 2019-01-02 (×15): 10 mg via ORAL
  Filled 2018-12-28 (×2): qty 1
  Filled 2018-12-28: qty 0.5
  Filled 2018-12-28 (×7): qty 1
  Filled 2018-12-28: qty 0.5
  Filled 2018-12-28: qty 1
  Filled 2018-12-28: qty 0.5
  Filled 2018-12-28 (×5): qty 1

## 2018-12-28 MED ORDER — AMOXICILLIN 500 MG PO CAPS
500.0000 mg | ORAL_CAPSULE | Freq: Three times a day (TID) | ORAL | Status: DC
  Administered 2018-12-28 – 2018-12-30 (×6): 500 mg via ORAL
  Filled 2018-12-28 (×6): qty 1

## 2018-12-28 MED ORDER — ENSURE ENLIVE PO LIQD
237.0000 mL | Freq: Two times a day (BID) | ORAL | Status: DC
  Administered 2018-12-28 – 2019-01-02 (×7): 237 mL via ORAL

## 2018-12-28 NOTE — Progress Notes (Signed)
CSW aware that CIR has denied pt at this time. CSW to follow up with pt at bedside to discuss SNF placement as an options. CSW aware that pt is currently alert and oriented X2. CSW will assess pt once more alert for further placement needs.    Darren Diaz, MSW, LCSW-A Emergency Department Clinical Social Worker [phone]

## 2018-12-28 NOTE — Progress Notes (Signed)
Darren Diaz TEAM 1 - Stepdown/ICU TEAM  Darren Diaz  IZT:[id number] DOB: 1986/06/25 DOA: 12/17/2018 PCP: Patient, No Pcp Per    Brief Narrative:  32yo w/ a Hx of EtOH abuse and seizure, Chr L shoulder pains and debility from this who presented 12/17/18 after a friend heard him fall, and discovered he was seizing. Following admission he was found to have Group A Strep bacteremia, and required ICU transfer for severe EtOH withdrawal.   Significant Events: 3/7 admit s/p seizure 3/9 Group A Strep RLE cellulitis  3/9 TTE - EF 35% w/ diffuse hypokinesis  3/11 tx to ICU - worsening EtOH withdrawal, acute respiratory failure, sepsis - intubated 3/13 extubated 3/14 TRH resumed care  3/15 venous duplex R UE - negative   Subjective: Awake alert pleasant in nad Low grade temps overnight Eating some--no further seizures  Assessment & Plan:  Sepsis due to Streptococcus pyogenes R LE cellulitis w/  bacteremia Care as suggested by ID - cont current abx - repeat cultures 3/16 negative thus far  Continuing on Penicillin Initial redness was in RLE and then in RUE--both resolved at this time  R antecubital/forearm erythema Cellulitis v/s contact dermatitis - cont current abx - avoid use of cuff or IV in this arm - no evidence of DVT on duplex - improved and no further concerns  Acute Respiratory Failure with hypoxia Due to EtOH withdrawal + sepsis - required intubation - extubated 3/13 - respiratory status stable on RA   Acute Encephalopathy  Multifactorial: sepsis, EtOH withdrawal, meds, seizure - CT head negative for acute findings - EEG no seizure activity - mental status much more stable today back on high dose frequent clonidine - stopped ativan as it was not working - added prn haldol  Stable at this time  Alcohol Abuse with withdrawal Initially required Precedex drip w/ severe agitation - transitioned to clonidine withdrawal protocol - monitor closely with high risk for recurrent seizure  activity/agitation  Seizure disorder Non-compliant with outpatient Keppra - no evidence of recurrent seizure thus far   Newly diagnosed Systolic heart failure EF 35% on TTE - Cards following - Entresto added - mildly volume overloaded on exam - now net negative ~5.5 liters  Filed Weights   12/25/18 0435 12/26/18 0500 12/27/18 0314  Weight: 98.8 kg 98.8 kg 96.1 kg     Uncontrolled HTN BP now well controlled - wean off nitro gtt as able  Place on isordil 10 tid and observe  Acute Kidney Injury Renal US unremarkable - crt stable   Hypomagnesemia   Supplement to goal of 2.0  Acute Liver injury with transaminitis  likely shock liver - RUQ US unremarkable for hepatic findings - LFTs now essentially normalized   Hx of PDA repair at age 70  Tobacco use disorder  DVT prophylaxis: lovenox  Code Status: FULL CODE Family Communication: Spoke with father  at bedside Disposition Plan: can transfe rto med surg today Disposition will be an issue  Consultants:  Cardiology ID  PCCM  Antimicrobials:  PCN G 3/9 >  Objective: Blood pressure 137/82, pulse 86, temperature 98.2 F (36.8 C), temperature source Oral, resp. rate 17, height 5\' 10"  (1.778 m), weight 96.1 kg, SpO2 91 %.  Intake/Output Summary (Last 24 hours) at 12/28/2018 1052 Last data filed at 12/28/2018 0900 Gross per 24 hour  Intake 1347.58 ml  Output 2164 ml  Net -816.42 ml   Filed Weights   12/25/18 0435 12/26/18 0500 12/27/18 0314  Weight: 98.8 kg 98.8 kg  96.1 kg    Examination: General: No acute respiratory distress - calm/sedate no ict no pallor-multiple tattoos Lungs: Clear to auscultation bilaterally - no wheezing  Cardiovascular: RRR - no M or rub  Abdomen: NT/ND, soft, BS+, no mass  Extremities: 1+ pitting edema bilateral lower extremities w/o signif change - erythema of right lower extremity has resolved -  erythema of R UE not observed  CBC: Recent Labs  Lab 12/26/18 0421 12/27/18 0219 12/28/18  0353  WBC 7.3 7.5  7.6 7.7  NEUTROABS  --  5.3  --   HGB 11.4* 10.1*  10.1* 9.9*  HCT 33.5* 30.6*  29.9* 29.8*  MCV 103.7* 103.4*  101.7* 102.4*  PLT 424* 490*  480* 528*   Basic Metabolic Panel: Recent Labs  Lab 12/24/18 0616 12/25/18 0428 12/26/18 0421 12/27/18 0219 12/28/18 0353  NA 137 137 139 138 137  K 3.1* 4.7 4.6 3.9 4.5  CL 107 113* 115* 110 103  CO2 19* 16* 17* 21* 26  GLUCOSE 105* 92 99 105* 95  BUN CREATININE 1.46* 1.38* 1.45* 1.48* 1.27*  CALCIUM 8.4* 8.0* 8.4* 8.4* 8.7*  MG 1.6* 2.1  --  1.7 1.9  PHOS 3.1 2.8  --  4.0  --    GFR: Estimated Creatinine Clearance: 95.3 mL/min (A) (by C-G formula based on SCr of 1.27 mg/dL (H)).  Liver Function Tests: Recent Labs  Lab 12/25/18 0428 12/26/18 0421 12/28/18 0353  AST 38 38 34  ALT 58* 46* 37  ALKPHOS 49 47 44  BILITOT 1.5* 1.3* 0.9  PROT 6.1* 6.5 6.3*  ALBUMIN 2.3* 2.3* 2.2*     Recent Results (from the past 240 hour(s))  Respiratory Panel by PCR     Status: None   Collection Time: 12/18/18 12:18 PM  Result Value Ref Range Status   Adenovirus NOT DETECTED NOT DETECTED Final   Coronavirus 229E NOT DETECTED NOT DETECTED Final    Comment: (NOTE) The Coronavirus on the Respiratory Panel, DOES NOT test for the novel  Coronavirus (2019 nCoV)    Coronavirus HKU1 NOT DETECTED NOT DETECTED Final   Coronavirus NL63 NOT DETECTED NOT DETECTED Final   Coronavirus OC43 NOT DETECTED NOT DETECTED Final   Metapneumovirus NOT DETECTED NOT DETECTED Final   Rhinovirus / Enterovirus NOT DETECTED NOT DETECTED Final   Influenza A NOT DETECTED NOT DETECTED Final   Influenza B NOT DETECTED NOT DETECTED Final   Parainfluenza Virus 1 NOT DETECTED NOT DETECTED Final   Parainfluenza Virus 2 NOT DETECTED NOT DETECTED Final   Parainfluenza Virus 3 NOT DETECTED NOT DETECTED Final   Parainfluenza Virus 4 NOT DETECTED NOT DETECTED Final   Respiratory Syncytial Virus NOT DETECTED NOT DETECTED Final    Bordetella pertussis NOT DETECTED NOT DETECTED Final   Chlamydophila pneumoniae NOT DETECTED NOT DETECTED Final   Mycoplasma pneumoniae NOT DETECTED NOT DETECTED Final    Comment: Performed at [facility], [address], [place], [place] [address]  Culture, blood (routine x 2)     Status: Abnormal   Collection Time: 12/18/18  1:00 PM  Result Value Ref Range Status   Specimen Description BLOOD LEFT HAND  Final   Special Requests   Final    BOTTLES DRAWN AEROBIC ONLY Blood Culture adequate volume   Culture  Setup Time   Final    GRAM POSITIVE COCCI IN CHAINS AEROBIC BOTTLE ONLY CRITICAL RESULT CALLED TO, READ BACK BY AND VERIFIED WITH: J.LADFORD,PHARMD AT  0450 ON 12/19/18 BY G.MCADOO    Culture (A)  Final    GROUP A STREP (S.PYOGENES) ISOLATED HEALTH DEPARTMENT NOTIFIED Performed at [facility], [address] [place], [place] [address]    Report Status 12/21/2018 FINAL  Final   Organism ID, Bacteria GROUP A STREP (S.PYOGENES) ISOLATED  Final      Susceptibility   Group a strep (s.pyogenes) isolated - MIC*    PENICILLIN <=0.06 SENSITIVE Sensitive     CEFTRIAXONE <=0.12 SENSITIVE Sensitive     ERYTHROMYCIN 2 RESISTANT Resistant     LEVOFLOXACIN <=0.25 SENSITIVE Sensitive     VANCOMYCIN <=0.12 SENSITIVE Sensitive     * GROUP A STREP (S.PYOGENES) ISOLATED  Blood Culture ID Panel (Reflexed)     Status: Abnormal   Collection Time: 12/18/18  1:00 PM  Result Value Ref Range Status   Enterococcus species NOT DETECTED NOT DETECTED Final   Listeria monocytogenes NOT DETECTED NOT DETECTED Final   Staphylococcus species NOT DETECTED NOT DETECTED Final   Staphylococcus aureus (BCID) NOT DETECTED NOT DETECTED Final   Streptococcus species DETECTED (A) NOT DETECTED Final    Comment: CRITICAL RESULT CALLED TO, READ BACK BY AND VERIFIED WITH: J.LADFORD,PHARMD AT 0450 ON 12/19/18 BY G.MCADOO    Streptococcus agalactiae NOT DETECTED NOT DETECTED Final   Streptococcus pneumoniae NOT  DETECTED NOT DETECTED Final   Streptococcus pyogenes DETECTED (A) NOT DETECTED Final    Comment: CRITICAL RESULT CALLED TO, READ BACK BY AND VERIFIED WITH: J.LADFORD,PHARMD AT 0450 ON 12/19/18 BY G.MCADOO    Acinetobacter baumannii NOT DETECTED NOT DETECTED Final   Enterobacteriaceae species NOT DETECTED NOT DETECTED Final   Enterobacter cloacae complex NOT DETECTED NOT DETECTED Final   Escherichia coli NOT DETECTED NOT DETECTED Final   Klebsiella oxytoca NOT DETECTED NOT DETECTED Final   Klebsiella pneumoniae NOT DETECTED NOT DETECTED Final   Proteus species NOT DETECTED NOT DETECTED Final   Serratia marcescens NOT DETECTED NOT DETECTED Final   Haemophilus influenzae NOT DETECTED NOT DETECTED Final   Neisseria meningitidis NOT DETECTED NOT DETECTED Final   Pseudomonas aeruginosa NOT DETECTED NOT DETECTED Final   Candida albicans NOT DETECTED NOT DETECTED Final   Candida glabrata NOT DETECTED NOT DETECTED Final   Candida krusei NOT DETECTED NOT DETECTED Final   Candida parapsilosis NOT DETECTED NOT DETECTED Final   Candida tropicalis NOT DETECTED NOT DETECTED Final    Comment: Performed at [facility], [address] [place], [place] [address]  Culture, blood (routine x 2)     Status: Abnormal   Collection Time: 12/18/18  1:20 PM  Result Value Ref Range Status   Specimen Description BLOOD RIGHT HAND  Final   Special Requests   Final    BOTTLES DRAWN AEROBIC ONLY Blood Culture results may not be optimal due to an inadequate volume of blood received in culture bottles   Culture  Setup Time   Final    GRAM POSITIVE COCCI IN CHAINS AEROBIC BOTTLE ONLY CRITICAL VALUE NOTED.  VALUE IS CONSISTENT WITH PREVIOUSLY REPORTED AND CALLED VALUE.    Culture (A)  Final    GROUP A STREP (S.PYOGENES) ISOLATED SUSCEPTIBILITIES PERFORMED ON PREVIOUS CULTURE WITHIN THE LAST 5 DAYS. HEALTH DEPARTMENT NOTIFIED Performed at [facility], [address] [place], [place] [address]    Report  Status 12/21/2018 FINAL  Final  Gastrointestinal Panel by PCR , Stool     Status: None   Collection Time: 12/18/18  3:19 PM  Result Value Ref Range Status   Campylobacter species NOT DETECTED NOT DETECTED Final   Plesimonas shigelloides NOT DETECTED NOT DETECTED Final   Salmonella species NOT DETECTED NOT DETECTED Final   Yersinia enterocolitica NOT DETECTED NOT DETECTED Final   Vibrio species NOT DETECTED NOT DETECTED Final   Vibrio cholerae NOT DETECTED NOT DETECTED Final   Enteroaggregative E coli (EAEC) NOT DETECTED NOT DETECTED Final   Enteropathogenic E coli (EPEC) NOT DETECTED NOT DETECTED Final   Enterotoxigenic E coli (ETEC) NOT DETECTED NOT DETECTED Final   Shiga like toxin producing E coli (STEC) NOT DETECTED NOT DETECTED Final   Shigella/Enteroinvasive E coli (EIEC) NOT DETECTED NOT DETECTED Final   Cryptosporidium NOT DETECTED NOT DETECTED Final   Cyclospora cayetanensis NOT DETECTED NOT DETECTED Final   Entamoeba histolytica NOT DETECTED NOT DETECTED Final   Giardia lamblia NOT DETECTED NOT DETECTED Final   Adenovirus F40/41 NOT DETECTED NOT DETECTED Final   Astrovirus NOT DETECTED NOT DETECTED Final   Norovirus GI/GII NOT DETECTED NOT DETECTED Final   Rotavirus A NOT DETECTED NOT DETECTED Final   Sapovirus (I, II, IV, and V) NOT DETECTED NOT DETECTED Final    Comment: Performed at [facility], [address] [place], [place] [address]  Culture, blood (routine x 2)     Status: None   Collection Time: 12/19/18  9:13 AM  Result Value Ref Range Status   Specimen Description BLOOD RIGHT ANTECUBITAL  Final   Special Requests AEROBIC BOTTLE ONLY Blood Culture adequate volume  Final   Culture   Final    NO GROWTH 5 DAYS Performed at [facility], [address], [place], [place] [address]    Report Status 12/24/2018 FINAL  Final  Culture, blood (routine x 2)     Status: None   Collection Time: 12/19/18  9:26 AM  Result Value Ref Range Status   Specimen  Description BLOOD RIGHT THUMB  Final   Special Requests AEROBIC BOTTLE ONLY Blood Culture adequate volume  Final   Culture   Final    NO GROWTH 5 DAYS Performed at [facility], [address] [place], [place] [address]    Report Status 12/24/2018 FINAL  Final  MRSA PCR Screening     Status: None   Collection Time: 12/21/18  8:38 AM  Result Value Ref Range Status   MRSA by PCR NEGATIVE NEGATIVE Final    Comment:        The GeneXpert MRSA Assay (FDA approved for NASAL specimens only), is one component of a comprehensive MRSA colonization surveillance program. It is not intended to diagnose MRSA infection nor to guide or monitor treatment for MRSA infections. Performed at [facility], [address] [place], [place] [address]   C difficile quick scan w PCR reflex     Status: None   Collection Time: 12/24/18  1:54 AM  Result Value Ref Range Status   C Diff antigen NEGATIVE NEGATIVE Final   C Diff toxin NEGATIVE NEGATIVE Final   C Diff interpretation No C. difficile detected.  Final    Comment: Performed at [facility], [address] [place], [place] [address]  Culture, Urine     Status: Abnormal   Collection Time: 12/26/18 10:22 AM  Result Value Ref Range Status   Specimen Description URINE, CATHETERIZED  Final   Special Requests NONE  Final   Culture (A)  Final    <10,000 COLONIES/mL INSIGNIFICANT GROWTH Performed  at [facility], [address], [place], [place] [address]    Report Status 12/27/2018 FINAL  Final  Culture, blood (routine x 2)     Status: None (Preliminary result)   Collection Time: 12/26/18 10:47 AM  Result Value Ref Range Status   Specimen Description BLOOD RIGHT ANTECUBITAL  Final   Special Requests   Final    BOTTLES DRAWN AEROBIC ONLY Blood Culture adequate volume   Culture   Final    NO GROWTH 2 DAYS Performed at [facility], [address], [place], [place] [address]    Report Status PENDING  Incomplete   Culture, blood (routine x 2)     Status: None (Preliminary result)   Collection Time: 12/26/18 10:53 AM  Result Value Ref Range Status   Specimen Description BLOOD RIGHT ARM  Final   Special Requests   Final    BOTTLES DRAWN AEROBIC ONLY Blood Culture adequate volume   Culture   Final    NO GROWTH 2 DAYS Performed at [facility], [address], [place], [place] [address]    Report Status PENDING  Incomplete     Scheduled Meds: . carvedilol  12.5 mg Oral BID WC  . cloNIDine  0.1 mg Oral QID   Followed by  . cloNIDine  0.1 mg Oral BH-qamhs   Followed by  . [START ON 12/31/2018] cloNIDine  0.1 mg Oral QAC breakfast  . enoxaparin (LOVENOX) injection  40 mg Subcutaneous Q24H  . folic acid  1 mg Oral Daily  . gabapentin  300 mg Oral BID  . levETIRAcetam  1,000 mg Oral BID  . mouth rinse  15 mL Mouth Rinse BID  . multivitamin with minerals  1 tablet Oral Daily  . nicotine  21 mg Transdermal Daily  . pantoprazole  40 mg Oral Daily  . vitamin B-6  100 mg Oral Daily  . sacubitril-valsartan  1 tablet Oral BID  . thiamine  100 mg Oral Daily     LOS: 11 days   Pleas Koch, MD [facility] 10:52 AM  If 7PM-7AM, please contact night-coverage per Amion 12/28/2018, 10:52 AM

## 2018-12-28 NOTE — Progress Notes (Signed)
Patient ID: Darren Diaz, male   DOB: November 21, 1985, 33 y.o.   MRN: [id number]         [facility]  Date of Admission:  12/17/2018    Total days of antibiotics 11        Day 10 penicillin         ASSESSMENT: He is now afebrile and looking much better.  We will change IV penicillin to oral amoxicillin and plan on 4 more days of therapy.  PLAN: 1. 3 more days of amoxicillin therapy 2. I will sign off now  Active Problems:   Alcohol withdrawal seizure, with unspecified complication (HCC)   Bacterial infection due to streptococcus, group A   Nonischemic cardiomyopathy (HCC)   Pressure injury of skin   Right shoulder pain   Scheduled Meds: . carvedilol  12.5 mg Oral BID WC  . cloNIDine  0.1 mg Oral QID   Followed by  . cloNIDine  0.1 mg Oral BH-qamhs   Followed by  . [START ON 12/31/2018] cloNIDine  0.1 mg Oral QAC breakfast  . enoxaparin (LOVENOX) injection  40 mg Subcutaneous Q24H  . feeding supplement (ENSURE ENLIVE)  237 mL Oral BID BM  . folic acid  1 mg Oral Daily  . gabapentin  300 mg Oral BID  . isosorbide dinitrate  10 mg Oral TID  . levETIRAcetam  1,000 mg Oral BID  . mouth rinse  15 mL Mouth Rinse BID  . multivitamin with minerals  1 tablet Oral Daily  . nicotine  21 mg Transdermal Daily  . pantoprazole  40 mg Oral Daily  . vitamin B-6  100 mg Oral Daily  . sacubitril-valsartan  1 tablet Oral BID  . thiamine  100 mg Oral Daily   Continuous Infusions: . lactated ringers 10 mL/hr at 12/27/18 1800  . penicillin g continuous IV infusion 12 Million Units (12/28/18 0621)   PRN Meds:.acetaminophen, bisacodyl, dicyclomine, Gerhardt's butt cream, haloperidol lactate, hydrALAZINE, levalbuterol, loperamide, [DISCONTINUED] ondansetron **OR** ondansetron (ZOFRAN) IV, oxyCODONE, phenol, sennosides   SUBJECTIVE: He is looking and feeling much better today.  He is alert and smiling.  He is seated in a wheelchair being rolled out of his room to go to a  regular bed.  He says that he has more soreness than usual in his right shoulder but that it is feeling better.  Review of Systems: Review of Systems  Constitutional: Negative for chills, diaphoresis and fever.  Gastrointestinal: Negative for abdominal pain, diarrhea, nausea and vomiting.  Musculoskeletal: Positive for joint pain.    No Known Allergies  OBJECTIVE: Vitals:   12/28/18 0913 12/28/18 1000 12/28/18 1100 12/28/18 1251  BP: 137/82 132/75 (!) 162/84 121/85  Pulse: 86 87 77 80  Resp:  (!) 23 20 20   Temp:   98.4 F (36.9 C) 98.6 F (37 C)  TempSrc:   Oral Oral  SpO2:  95% 95% 94%  Weight:      Height:       Body mass index is 30.4 kg/m.  Physical Exam Constitutional:      Comments: He is looking much better today.  Cardiovascular:     Rate and Rhythm: Normal rate and regular rhythm.     Heart sounds: No murmur.  Pulmonary:     Effort: Pulmonary effort is normal.     Breath sounds: Normal breath sounds.  Musculoskeletal:     Comments: He has much better range of motion of his right shoulder without obvious signs  of inflammation.  Psychiatric:        Mood and Affect: Mood normal.     Lab Results Lab Results  Component Value Date   WBC 7.7 12/28/2018   HGB 9.9 (L) 12/28/2018   HCT 29.8 (L) 12/28/2018   MCV 102.4 (H) 12/28/2018   PLT 528 (H) 12/28/2018    Lab Results  Component Value Date   CREATININE 1.27 (H) 12/28/2018   BUN 13 12/28/2018   NA 137 12/28/2018   K 4.5 12/28/2018   CL 103 12/28/2018   CO2 26 12/28/2018    Lab Results  Component Value Date   ALT 37 12/28/2018   AST 34 12/28/2018   ALKPHOS 44 12/28/2018   BILITOT 0.9 12/28/2018     Microbiology: Recent Results (from the past 240 hour(s))  Culture, blood (routine x 2)     Status: Abnormal   Collection Time: 12/18/18  1:00 PM  Result Value Ref Range Status   Specimen Description BLOOD LEFT HAND  Final   Special Requests   Final    BOTTLES DRAWN AEROBIC ONLY Blood Culture  adequate volume   Culture  Setup Time   Final    GRAM POSITIVE COCCI IN CHAINS AEROBIC BOTTLE ONLY CRITICAL RESULT CALLED TO, READ BACK BY AND VERIFIED WITH: J.LADFORD,PHARMD AT 0450 ON 12/19/18 BY G.MCADOO    Culture (A)  Final    GROUP A STREP (S.PYOGENES) ISOLATED HEALTH DEPARTMENT NOTIFIED Performed at [facility], [address] [place], [place] [address]    Report Status 12/21/2018 FINAL  Final   Organism ID, Bacteria GROUP A STREP (S.PYOGENES) ISOLATED  Final      Susceptibility   Group a strep (s.pyogenes) isolated - MIC*    PENICILLIN <=0.06 SENSITIVE Sensitive     CEFTRIAXONE <=0.12 SENSITIVE Sensitive     ERYTHROMYCIN 2 RESISTANT Resistant     LEVOFLOXACIN <=0.25 SENSITIVE Sensitive     VANCOMYCIN <=0.12 SENSITIVE Sensitive     * GROUP A STREP (S.PYOGENES) ISOLATED  Blood Culture ID Panel (Reflexed)     Status: Abnormal   Collection Time: 12/18/18  1:00 PM  Result Value Ref Range Status   Enterococcus species NOT DETECTED NOT DETECTED Final   Listeria monocytogenes NOT DETECTED NOT DETECTED Final   Staphylococcus species NOT DETECTED NOT DETECTED Final   Staphylococcus aureus (BCID) NOT DETECTED NOT DETECTED Final   Streptococcus species DETECTED (A) NOT DETECTED Final    Comment: CRITICAL RESULT CALLED TO, READ BACK BY AND VERIFIED WITH: J.LADFORD,PHARMD AT 0450 ON 12/19/18 BY G.MCADOO    Streptococcus agalactiae NOT DETECTED NOT DETECTED Final   Streptococcus pneumoniae NOT DETECTED NOT DETECTED Final   Streptococcus pyogenes DETECTED (A) NOT DETECTED Final    Comment: CRITICAL RESULT CALLED TO, READ BACK BY AND VERIFIED WITH: J.LADFORD,PHARMD AT 0450 ON 12/19/18 BY G.MCADOO    Acinetobacter baumannii NOT DETECTED NOT DETECTED Final   Enterobacteriaceae species NOT DETECTED NOT DETECTED Final   Enterobacter cloacae complex NOT DETECTED NOT DETECTED Final   Escherichia coli NOT DETECTED NOT DETECTED Final   Klebsiella oxytoca NOT DETECTED NOT DETECTED Final    Klebsiella pneumoniae NOT DETECTED NOT DETECTED Final   Proteus species NOT DETECTED NOT DETECTED Final   Serratia marcescens NOT DETECTED NOT DETECTED Final   Haemophilus influenzae NOT DETECTED NOT DETECTED Final   Neisseria meningitidis NOT DETECTED NOT DETECTED Final   Pseudomonas aeruginosa NOT DETECTED NOT DETECTED Final   Candida albicans NOT DETECTED NOT DETECTED Final  Candida glabrata NOT DETECTED NOT DETECTED Final   Candida krusei NOT DETECTED NOT DETECTED Final   Candida parapsilosis NOT DETECTED NOT DETECTED Final   Candida tropicalis NOT DETECTED NOT DETECTED Final    Comment: Performed at [facility], [address], [place], [place] [address]  Culture, blood (routine x 2)     Status: Abnormal   Collection Time: 12/18/18  1:20 PM  Result Value Ref Range Status   Specimen Description BLOOD RIGHT HAND  Final   Special Requests   Final    BOTTLES DRAWN AEROBIC ONLY Blood Culture results may not be optimal due to an inadequate volume of blood received in culture bottles   Culture  Setup Time   Final    GRAM POSITIVE COCCI IN CHAINS AEROBIC BOTTLE ONLY CRITICAL VALUE NOTED.  VALUE IS CONSISTENT WITH PREVIOUSLY REPORTED AND CALLED VALUE.    Culture (A)  Final    GROUP A STREP (S.PYOGENES) ISOLATED SUSCEPTIBILITIES PERFORMED ON PREVIOUS CULTURE WITHIN THE LAST 5 DAYS. HEALTH DEPARTMENT NOTIFIED Performed at [facility], [address], [place], [place] [address]    Report Status 12/21/2018 FINAL  Final  Gastrointestinal Panel by PCR , Stool     Status: None   Collection Time: 12/18/18  3:19 PM  Result Value Ref Range Status   Campylobacter species NOT DETECTED NOT DETECTED Final   Plesimonas shigelloides NOT DETECTED NOT DETECTED Final   Salmonella species NOT DETECTED NOT DETECTED Final   Yersinia enterocolitica NOT DETECTED NOT DETECTED Final   Vibrio species NOT DETECTED NOT DETECTED Final   Vibrio cholerae NOT DETECTED NOT DETECTED Final    Enteroaggregative E coli (EAEC) NOT DETECTED NOT DETECTED Final   Enteropathogenic E coli (EPEC) NOT DETECTED NOT DETECTED Final   Enterotoxigenic E coli (ETEC) NOT DETECTED NOT DETECTED Final   Shiga like toxin producing E coli (STEC) NOT DETECTED NOT DETECTED Final   Shigella/Enteroinvasive E coli (EIEC) NOT DETECTED NOT DETECTED Final   Cryptosporidium NOT DETECTED NOT DETECTED Final   Cyclospora cayetanensis NOT DETECTED NOT DETECTED Final   Entamoeba histolytica NOT DETECTED NOT DETECTED Final   Giardia lamblia NOT DETECTED NOT DETECTED Final   Adenovirus F40/41 NOT DETECTED NOT DETECTED Final   Astrovirus NOT DETECTED NOT DETECTED Final   Norovirus GI/GII NOT DETECTED NOT DETECTED Final   Rotavirus A NOT DETECTED NOT DETECTED Final   Sapovirus (I, II, IV, and V) NOT DETECTED NOT DETECTED Final    Comment: Performed at [facility], [address] [place], [place] [address]  Culture, blood (routine x 2)     Status: None   Collection Time: 12/19/18  9:13 AM  Result Value Ref Range Status   Specimen Description BLOOD RIGHT ANTECUBITAL  Final   Special Requests AEROBIC BOTTLE ONLY Blood Culture adequate volume  Final   Culture   Final    NO GROWTH 5 DAYS Performed at [facility], [address] [place], [place] [address]    Report Status 12/24/2018 FINAL  Final  Culture, blood (routine x 2)     Status: None   Collection Time: 12/19/18  9:26 AM  Result Value Ref Range Status   Specimen Description BLOOD RIGHT THUMB  Final   Special Requests AEROBIC BOTTLE ONLY Blood Culture adequate volume  Final   Culture   Final    NO GROWTH 5 DAYS Performed at [facility], [address], [place], [place] [address]  Report Status 12/24/2018 FINAL  Final  MRSA PCR Screening     Status: None   Collection Time: 12/21/18  8:38 AM  Result Value Ref Range Status   MRSA by PCR NEGATIVE NEGATIVE Final    Comment:        The GeneXpert MRSA Assay (FDA approved for NASAL  specimens only), is one component of a comprehensive MRSA colonization surveillance program. It is not intended to diagnose MRSA infection nor to guide or monitor treatment for MRSA infections. Performed at [facility], [address] [place], [place] [address]   C difficile quick scan w PCR reflex     Status: None   Collection Time: 12/24/18  1:54 AM  Result Value Ref Range Status   C Diff antigen NEGATIVE NEGATIVE Final   C Diff toxin NEGATIVE NEGATIVE Final   C Diff interpretation No C. difficile detected.  Final    Comment: Performed at [facility], [address], [place], [place] [address]  Culture, Urine     Status: Abnormal   Collection Time: 12/26/18 10:22 AM  Result Value Ref Range Status   Specimen Description URINE, CATHETERIZED  Final   Special Requests NONE  Final   Culture (A)  Final    <10,000 COLONIES/mL INSIGNIFICANT GROWTH Performed at [facility], [address] [place], [place] [address]    Report Status 12/27/2018 FINAL  Final  Culture, blood (routine x 2)     Status: None (Preliminary result)   Collection Time: 12/26/18 10:47 AM  Result Value Ref Range Status   Specimen Description BLOOD RIGHT ANTECUBITAL  Final   Special Requests   Final    BOTTLES DRAWN AEROBIC ONLY Blood Culture adequate volume   Culture   Final    NO GROWTH 2 DAYS Performed at [facility], [address], [place], [place] [address]    Report Status PENDING  Incomplete  Culture, blood (routine x 2)     Status: None (Preliminary result)   Collection Time: 12/26/18 10:53 AM  Result Value Ref Range Status   Specimen Description BLOOD RIGHT ARM  Final   Special Requests   Final    BOTTLES DRAWN AEROBIC ONLY Blood Culture adequate volume   Culture   Final    NO GROWTH 2 DAYS Performed at [facility], [address], [place], [place] [address]    Report Status PENDING  Incomplete    Cliffton Asters, MD [facility] for Infectious Disease  [facility] [phone] pager   [phone] cell 12/28/2018, 12:58 PM

## 2018-12-28 NOTE — Progress Notes (Signed)
Nutrition Follow-up  DOCUMENTATION CODES:   Not applicable  INTERVENTION:    Ensure Enlive po BID, each supplement provides 350 kcal and 20 grams of protein  Continue multivitamin daily  Recommend OT evaluation due to difficulty holding fork  NUTRITION DIAGNOSIS:   Inadequate oral intake related to other (see comment)(slow eating) as evidenced by per patient/family report.  Ongoing   GOAL:   Patient will meet greater than or equal to 90% of their needs  Progressing  MONITOR:   PO intake, Supplement acceptance  ASSESSMENT:   33 yo male with PMH of seizures, alcohol abuse, tobacco abuse, PDA repair at age 89 who was admitted with group A strep bacteremia, alcohol withdrawal, and cardiomyopathy. Required ICU transfer and intubation on 3/11.   Patient was extubated 3/13.  Transferring to 5W today.  S/P swallow evaluations with SLP. Diet advanced to regular diet 3/13, currently on heart healthy diet. Intake has been poor, consuming < 50% of meals. He is a slow eater. Seems to have trouble holding a fork. He says his whole right side is weak from a previous shoulder injury. He tries to eat with his left hand, but right is dominant. He does very well with beverages. He likes Boost, drinks it at home. Agreed to drink Ensure Enlive while here.   Labs reviewed.  Medications reviewed and include folic acid, MVI, vitamin B-6, thiamine.    Diet Order:   Diet Order            Diet Heart Room service appropriate? 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  Estimated Nutritional Needs:   Kcal:  2100-2300  Protein:  110-130 gm  Fluid:  2.2 L    Joaquin Courts, RD, LDN, CNSC Pager [phone] After Hours Pager [phone]

## 2018-12-28 NOTE — Progress Notes (Signed)
Progress Note  Patient Name: Darren Diaz Date of Encounter: 12/28/2018  Primary Cardiologist: No primary care provider on file.   Subjective   Awake, alert no voiced complaints.   Inpatient Medications    Scheduled Meds: . carvedilol  12.5 mg Oral BID WC  . cloNIDine  0.1 mg Oral QID   Followed by  . cloNIDine  0.1 mg Oral BH-qamhs   Followed by  . [START ON 12/31/2018] cloNIDine  0.1 mg Oral QAC breakfast  . enoxaparin (LOVENOX) injection  40 mg Subcutaneous Q24H  . folic acid  1 mg Oral Daily  . gabapentin  300 mg Oral BID  . isosorbide dinitrate  10 mg Oral TID  . levETIRAcetam  1,000 mg Oral BID  . mouth rinse  15 mL Mouth Rinse BID  . multivitamin with minerals  1 tablet Oral Daily  . nicotine  21 mg Transdermal Daily  . pantoprazole  40 mg Oral Daily  . vitamin B-6  100 mg Oral Daily  . sacubitril-valsartan  1 tablet Oral BID  . thiamine  100 mg Oral Daily   Continuous Infusions: . lactated ringers 10 mL/hr at 12/27/18 1800  . penicillin g continuous IV infusion 12 Million Units (12/28/18 6381)   PRN Meds: acetaminophen, bisacodyl, dicyclomine, Gerhardt's butt cream, haloperidol lactate, hydrALAZINE, levalbuterol, loperamide, [DISCONTINUED] ondansetron **OR** ondansetron (ZOFRAN) IV, oxyCODONE, phenol, sennosides   Vital Signs    Vitals:   12/28/18 0700 12/28/18 0800 12/28/18 0900 12/28/18 0913  BP: 132/86 (!) 138/101  137/82  Pulse: 79 72  86  Resp: (!) 22 (!) 22 17   Temp: 98.2 F (36.8 C)     TempSrc: Oral     SpO2: 90% 91%    Weight:      Height:        Intake/Output Summary (Last 24 hours) at 12/28/2018 1106 Last data filed at 12/28/2018 0900 Gross per 24 hour  Intake 1295.89 ml  Output 2076 ml  Net -780.11 ml   Last 3 Weights 12/27/2018 12/26/2018 12/25/2018  Weight (lbs) 211 lb 13.8 oz 217 lb 13 oz 217 lb 13 oz  Weight (kg) 96.1 kg 98.8 kg 98.8 kg      Telemetry    Normal sinus rhythm- Personally Reviewed   Physical Exam   GEN: No  acute distress.   Neck: No JVD Cardiac: RRR, no murmurs, rubs, or gallops.  Respiratory:  Coarse breath sounds bilaterally. GI: Soft, nontender, non-distended  MS: No edema; No deformity. Neuro:  Nonfocal  Psych: Sedated  Labs    Chemistry Recent Labs  Lab 12/25/18 0428 12/26/18 0421 12/27/18 0219 12/28/18 0353  NA 137 139 138 137  K 4.7 4.6 3.9 4.5  CL 113* 115* 110 103  CO2 16* 17* 21* 26  GLUCOSE 92 99 105* 95  BUN 17 16 13 13   CREATININE 1.38* 1.45* 1.48* 1.27*  CALCIUM 8.0* 8.4* 8.4* 8.7*  PROT 6.1* 6.5  --  6.3*  ALBUMIN 2.3* 2.3*  --  2.2*  AST 38 38  --  34  ALT 58* 46*  --  37  ALKPHOS 49 47  --  44  BILITOT 1.5* 1.3*  --  0.9  GFRNONAA >60 >60 >60 >60  GFRAA >60 >60 >60 >60  ANIONGAP 8 7 7 8      Hematology Recent Labs  Lab 12/26/18 0421 12/27/18 0219 12/28/18 0353  WBC 7.3 7.5  7.6 7.7  RBC 3.23* 2.96*  2.94* 2.91*  HGB 11.4* 10.1*  10.1* 9.9*  HCT 33.5* 30.6*  29.9* 29.8*  MCV 103.7* 103.4*  101.7* 102.4*  MCH 35.3* 34.1*  34.4* 34.0  MCHC 34.0 33.0  33.8 33.2  RDW 13.2 12.8  12.6 12.0  PLT 424* 490*  480* 528*    Cardiac EnzymesNo results for input(s): TROPONINI in the last 168 hours. No results for input(s): TROPIPOC in the last 168 hours.   BNP No results for input(s): BNP, PROBNP in the last 168 hours.   DDimer No results for input(s): DDIMER in the last 168 hours.   Radiology    No results found.  Cardiac Studies   Echo: 1. The left ventricle has a visually estimated ejection fraction of of 35%. The cavity size was mildly dilated. Left ventricular diastolic Doppler parameters are consistent with impaired relaxation. Left ventricular diffuse hypokinesis.  2. The right ventricle has normal systolic function. The cavity was normal. There is no increase in right ventricular wall thickness.  3. No evidence of mitral valve stenosis. Trivial mitral regurgitation.  4. The aortic valve is tricuspid no stenosis of the aortic valve.   5. The aortic root and ascending aorta are normal in size and structure.  6. The inferior vena cava was dilated in size with <50% respiratory variability. No complete TR doppler jet so unable to estimate PA systolic pressure.  FINDINGS  Left Ventricle: The left ventricle has a visually estimated ejection fraction of of 35%. The cavity size was mildly dilated. There is no increase in left ventricular wall thickness. Left ventricular diastolic Doppler parameters are consistent with  impaired relaxation. Left ventricular diffuse hypokinesis. Right Ventricle: The right ventricle has normal systolic function. The cavity was normal. There is no increase in right ventricular wall thickness. Left Atrium: left atrial size was normal in size Right Atrium: right atrial size was normal in size. Right atrial pressure is estimated at 15 mmHg. Interatrial Septum: No atrial level shunt detected by color flow Doppler. Pericardium: There is no evidence of pericardial effusion. Mitral Valve: The mitral valve is normal in structure. Mitral valve regurgitation is trivial by color flow Doppler. No evidence of mitral valve stenosis. Tricuspid Valve: The tricuspid valve is normal in structure. Tricuspid valve regurgitation is trivial by color flow Doppler. Aortic Valve: The aortic valve is tricuspid Aortic valve regurgitation was not visualized by color flow Doppler. There is no stenosis of the aortic valve. Pulmonic Valve: The pulmonic valve was normal in structure. Pulmonic valve regurgitation is not visualized by color flow Doppler. Aorta: The aortic root and ascending aorta are normal in size and structure. Venous: The inferior vena cava is dilated in size with less than 50% respiratory variability.     Patient Profile     33 y.o. male with a hx of PDA repair (age 89 at [facility] state), seizure disorder due heavy alcohol drinking and tobacco smokingwho is being seen for the evaluation of Low EFat the request of  Dr. Jerral Ralph.  Assessment & Plan    1. 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 I spoke with patient and father who is present. Stressed the importance of compliance and abstinence from Winder. On Coreg and Entresto currently. Does not appear volume overloaded. Discussed sodium restriction. Renal function is improving. Consider addition of aldactone if renal function continues to improve. Plan to repeat Echo in 3 months after optimal therapy.  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 improving  3. HTN, in setting of alcohol withdrawal.  Now normotensive.  We will follow with you as he progresses.  For questions or updates, please contact [facility] Please consult [email or web address] for contact info under        Signed, Shuntel Fishburn [place], MD  12/28/2018, 11:06 AM

## 2018-12-29 ENCOUNTER — Inpatient Hospital Stay (HOSPITAL_COMMUNITY): Payer: BLUE CROSS/BLUE SHIELD

## 2018-12-29 DIAGNOSIS — N179 Acute kidney failure, unspecified: Secondary | ICD-10-CM

## 2018-12-29 DIAGNOSIS — I5041 Acute combined systolic (congestive) and diastolic (congestive) heart failure: Secondary | ICD-10-CM

## 2018-12-29 MED ORDER — GADOBUTROL 1 MMOL/ML IV SOLN
10.0000 mL | Freq: Once | INTRAVENOUS | Status: AC | PRN
  Administered 2018-12-29: 9 mL via INTRAVENOUS

## 2018-12-29 NOTE — Progress Notes (Addendum)
Progress Note  Patient Name: Darren Diaz Date of Encounter: 12/29/2018  Primary Cardiologist: Nanetta Batty, MD   Subjective   R shoulder pain, weak, has not been OOB. No chest pain or SOB.  Inpatient Medications    Scheduled Meds: . amoxicillin  500 mg Oral Q8H  . carvedilol  12.5 mg Oral BID WC  . cloNIDine  0.1 mg Oral BH-qamhs   Followed by  . [START ON 12/31/2018] cloNIDine  0.1 mg Oral QAC breakfast  . enoxaparin (LOVENOX) injection  40 mg Subcutaneous Q24H  . feeding supplement (ENSURE ENLIVE)  237 mL Oral BID BM  . folic acid  1 mg Oral Daily  . gabapentin  300 mg Oral BID  . isosorbide dinitrate  10 mg Oral TID  . levETIRAcetam  1,000 mg Oral BID  . mouth rinse  15 mL Mouth Rinse BID  . multivitamin with minerals  1 tablet Oral Daily  . nicotine  21 mg Transdermal Daily  . pantoprazole  40 mg Oral Daily  . vitamin B-6  100 mg Oral Daily  . sacubitril-valsartan  1 tablet Oral BID  . thiamine  100 mg Oral Daily   Continuous Infusions: . lactated ringers 10 mL/hr at 12/27/18 1800   PRN Meds: acetaminophen, bisacodyl, Gerhardt's butt cream, haloperidol lactate, hydrALAZINE, levalbuterol, [DISCONTINUED] ondansetron **OR** ondansetron (ZOFRAN) IV, oxyCODONE, phenol, sennosides   Vital Signs    Vitals:   12/28/18 1251 12/28/18 2128 12/29/18 0556 12/29/18 0857  BP: 121/85 116/77 136/88 (!) 135/93  Pulse: 80 82 80 92  Resp: 20 18 18    Temp: 98.6 F (37 C) 98.1 F (36.7 C) 98.6 F (37 C)   TempSrc: Oral Oral Oral   SpO2: 94% 95% 96%   Weight:   89 kg   Height:        Intake/Output Summary (Last 24 hours) at 12/29/2018 1049 Last data filed at 12/29/2018 0500 Gross per 24 hour  Intake -  Output 1101 ml  Net -1101 ml   Last 3 Weights 12/29/2018 12/27/2018 12/26/2018  Weight (lbs) 196 lb 3.4 oz 211 lb 13.8 oz 217 lb 13 oz  Weight (kg) 89 kg 96.1 kg 98.8 kg      Telemetry    SR, ST- Personally Reviewed  Physical Exam   General: Well developed, well  nourished, male in no acute distress Head: Eyes PERRLA, No xanthomas.   Normocephalic and atraumatic Lungs: Clear bilaterally to auscultation. Heart: HRRR S1 S2, without MRG.  Pulses are 2+ & equal. No JVD. Abdomen: Bowel sounds are present, abdomen soft and non-tender without masses or  hernias noted. Msk: Normal strength and tone for age. Rarm painful to move. Extremities: No clubbing, cyanosis or edema.    Skin:  No rashes or lesions noted. Neuro: Alert and oriented X 3. +tremors Psych:  Good affect, responds appropriately  Labs    Chemistry Recent Labs  Lab 12/25/18 0428 12/26/18 0421 12/27/18 0219 12/28/18 0353  NA 137 139 138 137  K 4.7 4.6 3.9 4.5  CL 113* 115* 110 103  CO2 16* 17* 21* 26  GLUCOSE 92 99 105* 95  BUN 17 16 13 13   CREATININE 1.38* 1.45* 1.48* 1.27*  CALCIUM 8.0* 8.4* 8.4* 8.7*  PROT 6.1* 6.5  --  6.3*  ALBUMIN 2.3* 2.3*  --  2.2*  AST 38 38  --  34  ALT 58* 46*  --  37  ALKPHOS 49 47  --  44  BILITOT 1.5* 1.3*  --  0.9  GFRNONAA >60 >60 >60 >60  GFRAA >60 >60 >60 >60  ANIONGAP 8 7 7 8      Hematology Recent Labs  Lab 12/26/18 0421 12/27/18 0219 12/28/18 0353  WBC 7.3 7.5  7.6 7.7  RBC 3.23* 2.96*  2.94* 2.91*  HGB 11.4* 10.1*  10.1* 9.9*  HCT 33.5* 30.6*  29.9* 29.8*  MCV 103.7* 103.4*  101.7* 102.4*  MCH 35.3* 34.1*  34.4* 34.0  MCHC 34.0 33.0  33.8 33.2  RDW 13.2 12.8  12.6 12.0  PLT 424* 490*  480* 528*    Radiology    No results found.  Cardiac Studies   Echo: 1. The left ventricle has a visually estimated ejection fraction of of 35%. The cavity size was mildly dilated. Left ventricular diastolic Doppler parameters are consistent with impaired relaxation. Left ventricular diffuse hypokinesis.  2. The right ventricle has normal systolic function. The cavity was normal. There is no increase in right ventricular wall thickness.  3. No evidence of mitral valve stenosis. Trivial mitral regurgitation.  4. The aortic valve  is tricuspid no stenosis of the aortic valve.  5. The aortic root and ascending aorta are normal in size and structure.  6. The inferior vena cava was dilated in size with <50% respiratory variability. No complete TR doppler jet so unable to estimate PA systolic pressure.  FINDINGS  Left Ventricle: The left ventricle has a visually estimated ejection fraction of of 35%. The cavity size was mildly dilated. There is no increase in left ventricular wall thickness. Left ventricular diastolic Doppler parameters are consistent with  impaired relaxation. Left ventricular diffuse hypokinesis. Right Ventricle: The right ventricle has normal systolic function. The cavity was normal. There is no increase in right ventricular wall thickness. Left Atrium: left atrial size was normal in size Right Atrium: right atrial size was normal in size. Right atrial pressure is estimated at 15 mmHg. Interatrial Septum: No atrial level shunt detected by color flow Doppler. Pericardium: There is no evidence of pericardial effusion. Mitral Valve: The mitral valve is normal in structure. Mitral valve regurgitation is trivial by color flow Doppler. No evidence of mitral valve stenosis. Tricuspid Valve: The tricuspid valve is normal in structure. Tricuspid valve regurgitation is trivial by color flow Doppler. Aortic Valve: The aortic valve is tricuspid Aortic valve regurgitation was not visualized by color flow Doppler. There is no stenosis of the aortic valve. Pulmonic Valve: The pulmonic valve was normal in structure. Pulmonic valve regurgitation is not visualized by color flow Doppler. Aorta: The aortic root and ascending aorta are normal in size and structure. Venous: The inferior vena cava is dilated in size with less than 50% respiratory variability.    Patient Profile     33 y.o. male with a hx of PDA repair (age 80 at [facility] state), seizure disorder due heavy alcohol drinking and tobacco smokingwho is being seen for  the evaluation of Low EFat the request of Dr. Jerral Ralph.  Assessment & Plan    1. 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 CKD II:  -  per IM, improving  3. HTN - tolerating meds well   We will follow with you as he progresses.  For questions or updates, please contact [facility] Please consult [email or web address] for contact info under        Signed, Theodore Demark, PA-C  12/29/2018, 10:49 AM    I have seen and examined the patient along with Theodore Demark, PA-C .  I have reviewed the chart, notes and new data.  I agree with PA/NP's note.  Key new complaints: no CV complaints Key examination changes: no overt hypovolemia; on clonidine for withdrawal symptoms and is on a weaning schedule. Key new findings / data: echo reviewed.  PLAN: Recommend uptitrating Entresto as allowed by BP, as he comes down off the clonidine.  Long acting nitrates without hydralazine are not of value in CHF and I would not recommend increasing them. Add aldactone after we reach max dose Entresto.  Thurmon Fair, MD, [facility] [phone] 12/29/2018, 3:07 PM

## 2018-12-29 NOTE — Progress Notes (Signed)
PROGRESS NOTE        PATIENT DETAILS Name: Darren Diaz Age: 33 y.o. Sex: male Date of Birth: 1985/12/21 Admit Date: 12/17/2018 Admitting Physician Hillary Bow, DO JTT:[id number], No Pcp Per  Brief Narrative: Patient is a 33 y.o. male with history of alcohol use, seizure disorder-presenting with breakthrough seizure, fever-found to have sepsis pathophysiology secondary to Streptococcus pyogenes bacteremia and right lower extremity cellulitis.  Unfortunately, patient continued to deteriorate, and developed delirium tremens, and acute hypoxic respiratory failure-he was intubated and managed by CCM in the ICU.  Subsequently improved, was extubated on 3/13, and transferred to [facility] on 3/14.  Subjective: Complains of pain-worsening-in the right shoulder area.  Assessment/Plan: Sepsis secondary to Streptococcus pyogenes bacteremia and right lower extremity cellulitis: Sepsis pathophysiology has resolved, TTE was negative for endocarditis-initially on IV penicillin G-subsequently transitioned to amoxicillin with a stop date of 3/22.    Right shoulder pain: Continues to have worsening right shoulder pain over the past few days, shoulder x-ray on 3/10 was negative-we will go ahead and order a MRI of the right shoulder.  Right upper extremity Doppler was negative for DVT on 3/15.  Acute hypoxic respiratory failure: Secondary to sepsis, worsening DTs-intubated on 3/11-and extubated on 3/13.  Currently on room air.    Acute metabolic encephalopathy: Resolved.  Secondary to alcohol withdrawal/DTs and sepsis.  CT head negative for acute abnormalities, EEG without any seizures.  Currently completely awake and alert.  .  Alcohol withdrawal with DTs: Required transfer to ICU-intubation and mechanical ventilation-required Precedex infusion while in the ICU.  Withdrawal symptoms have resolved-currently on tapering clonidine.    Seizure disorder: Continue Keppra-EEG negative for  seizures.    Transaminitis: Resolved.  Likely secondary to sepsis/shock liver, AST/ALT downtrending.  RUQ ultrasound unremarkable.   AKI: Suspect hemodynamically mediated-improving with supportive care.    Newly diagnosed chronic systolic heart failure: Him status is stable-continue Isordil and Entresto.  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     DVT Prophylaxis: Prophylactic Lovenox   Code Status: Full code  Family Communication: None at bedside  Disposition Plan: Remain inpatient  Antimicrobial agents: Anti-infectives (From admission, onward)   Start     Dose/Rate Route Frequency Ordered Stop   12/28/18 1400  amoxicillin (AMOXIL) capsule 500 mg     500 mg Oral Every 8 hours 12/28/18 1304 01/01/19 1359   12/19/18 1700  penicillin G potassium 12 Million Units in dextrose 5 % 500 mL continuous infusion  Status:  Discontinued     12 Million Units 41.7 mL/hr over 12 Hours Intravenous Every 12 hours 12/19/18 1144 12/28/18 1304   12/19/18 0400  vancomycin (VANCOCIN) 1,500 mg in sodium chloride 0.9 % 500 mL IVPB  Status:  Discontinued     1,500 mg 250 mL/hr over 120 Minutes Intravenous Every 12 hours 12/18/18 1530 12/19/18 0901   12/18/18 1600  vancomycin (VANCOCIN) 2,000 mg in sodium chloride 0.9 % 500 mL IVPB     2,000 mg 250 mL/hr over 120 Minutes Intravenous  Once 12/18/18 1530 12/19/18 0748   12/18/18 1530  cefTRIAXone (ROCEPHIN) 2 g in sodium chloride 0.9 % 100 mL IVPB  Status:  Discontinued     2 g 200 mL/hr over 30 Minutes Intravenous Every 24 hours 12/18/18 1519 12/19/18  1115   12/18/18 1530  metroNIDAZOLE (FLAGYL) IVPB 500 mg  Status:  Discontinued     500 mg 100 mL/hr over 60 Minutes Intravenous Every 8 hours 12/18/18 1519 12/19/18 0901     Procedures: None  CONSULTS:  None  Time spent: 25 minutes  MEDICATIONS: Scheduled  Meds:  amoxicillin  500 mg Oral Q8H   carvedilol  12.5 mg Oral BID WC   cloNIDine  0.1 mg Oral BH-qamhs   Followed by   Melene Muller ON 12/31/2018] cloNIDine  0.1 mg Oral QAC breakfast   enoxaparin (LOVENOX) injection  40 mg Subcutaneous Q24H   feeding supplement (ENSURE ENLIVE)  237 mL Oral BID BM   folic acid  1 mg Oral Daily   gabapentin  300 mg Oral BID   isosorbide dinitrate  10 mg Oral TID   levETIRAcetam  1,000 mg Oral BID   mouth rinse  15 mL Mouth Rinse BID   multivitamin with minerals  1 tablet Oral Daily   nicotine  21 mg Transdermal Daily   pantoprazole  40 mg Oral Daily   vitamin B-6  100 mg Oral Daily   sacubitril-valsartan  1 tablet Oral BID   thiamine  100 mg Oral Daily   Continuous Infusions:  lactated ringers 10 mL/hr at 12/27/18 1800   PRN Meds:.acetaminophen, bisacodyl, Gerhardt's butt cream, haloperidol lactate, hydrALAZINE, levalbuterol, [DISCONTINUED] ondansetron **OR** ondansetron (ZOFRAN) IV, oxyCODONE, phenol, sennosides   PHYSICAL EXAM: Vital signs: Vitals:   12/28/18 2128 12/29/18 0556 12/29/18 0857 12/29/18 1254  BP: 116/77 136/88 (!) 135/93 132/88  Pulse: 82 80 92 77  Resp: Temp: 98.1 F (36.7 C) 98.6 F (37 C)  97.9 F (36.6 C)  TempSrc: Oral Oral  Oral  SpO2: 95% 96%  98%  Weight:  89 kg    Height:       Filed Weights   12/26/18 0500 12/27/18 0314 12/29/18 0556  Weight: 98.8 kg 96.1 kg 89 kg   Body mass index is 28.15 kg/m.   General appearance:Awake, alert, not in any distress.  Eyes:no scleral icterus. HEENT: Atraumatic and Normocephalic Neck: supple, no JVD. Resp:Good air entry bilaterally,no rales or rhonchi CVS: S1 S2 regular, no murmurs.  GI: Bowel sounds present, Non tender and not distended with no gaurding, rigidity or rebound. Extremities: B/L Lower Ext shows no edema, both legs are warm to touch Neurology:  Non focal Psychiatric: Normal judgment and insight. Normal mood. Musculoskeletal:No  digital cyanosis Skin:No Rash, warm and dry Wounds:N/A  I have personally reviewed following labs and imaging studies  LABORATORY DATA: CBC: Recent Labs  Lab 12/24/18 0616 12/25/18 0803 12/26/18 0421 12/27/18 0219 12/28/18 0353  WBC 7.2 7.4 7.3 7.5   7.6 7.7  NEUTROABS  --   --   --  5.3  --   HGB 10.7* 10.7* 11.4* 10.1*   10.1* 9.9*  HCT 30.7* 31.3* 33.5* 30.6*   29.9* 29.8*  MCV 99.0 100.0 103.7* 103.4*   101.7* 102.4*  PLT 218 342 424* 490*   480* 528*    Basic Metabolic Panel: Recent Labs  Lab 12/22/18 1703  12/23/18 0234 12/24/18 0616 12/25/18 0428 12/26/18 0421 12/27/18 0219 12/28/18 0353  NA  --    < > 140 137 137 139 138 137  K  --    < > 4.4 3.1* 4.7 4.6 3.9 4.5  CL  --    < > 111 107 113* 115* 110 103  CO2  --    < >  22 19* 16* 17* 21* 26  GLUCOSE  --    < > 122* 105* 92 99 105* 95  BUN  --    < > 25* CREATININE  --    < > 1.54* 1.46* 1.38* 1.45* 1.48* 1.27*  CALCIUM  --    < > 8.3* 8.4* 8.0* 8.4* 8.4* 8.7*  MG 1.8  --  1.6* 1.6* 2.1  --  1.7 1.9  PHOS 2.2*  --  2.1* 3.1 2.8  --  4.0  --    < > = values in this interval not displayed.    GFR: Estimated Creatinine Clearance: 92 mL/min (A) (by C-G formula based on SCr of 1.27 mg/dL (H)).  Liver Function Tests: Recent Labs  Lab 12/25/18 0428 12/26/18 0421 12/28/18 0353  AST 38 38 34  ALT 58* 46* 37  ALKPHOS 49 47 44  BILITOT 1.5* 1.3* 0.9  PROT 6.1* 6.5 6.3*  ALBUMIN 2.3* 2.3* 2.2*   No results for input(s): LIPASE, AMYLASE in the last 168 hours. No results for input(s): AMMONIA in the last 168 hours.  Coagulation Profile: No results for input(s): INR, PROTIME in the last 168 hours.  Cardiac Enzymes: No results for input(s): CKTOTAL, CKMB, CKMBINDEX, TROPONINI in the last 168 hours.  BNP (last 3 results) No results for input(s): PROBNP in the last 8760 hours.  HbA1C: No results for input(s): HGBA1C in the last 72 hours.  CBG: Recent Labs  Lab 12/23/18 2024  12/23/18 2352 12/24/18 0457 12/24/18 1156 12/26/18 0836  GLUCAP 109* 95 83 116* 95    Lipid Profile: No results for input(s): CHOL, HDL, LDLCALC, TRIG, CHOLHDL, LDLDIRECT in the last 72 hours.  Thyroid Function Tests: No results for input(s): TSH, T4TOTAL, FREET4, T3FREE, THYROIDAB in the last 72 hours.  Anemia Panel: No results for input(s): VITAMINB12, FOLATE, FERRITIN, TIBC, IRON, RETICCTPCT in the last 72 hours.  Urine analysis:    Component Value Date/Time   COLORURINE YELLOW 12/26/2018 1022   APPEARANCEUR CLEAR 12/26/2018 1022   LABSPEC 1.011 12/26/2018 1022   PHURINE 5.0 12/26/2018 1022   GLUCOSEU NEGATIVE 12/26/2018 1022   HGBUR SMALL (A) 12/26/2018 1022   BILIRUBINUR NEGATIVE 12/26/2018 1022   KETONESUR NEGATIVE 12/26/2018 1022   PROTEINUR NEGATIVE 12/26/2018 1022   NITRITE NEGATIVE 12/26/2018 1022   LEUKOCYTESUR NEGATIVE 12/26/2018 1022    Sepsis Labs: Lactic Acid, Venous    Component Value Date/Time   LATICACIDVEN 0.8 12/21/2018 0738    MICROBIOLOGY: Recent Results (from the past 240 hour(s))  MRSA PCR Screening     Status: None   Collection Time: 12/21/18  8:38 AM  Result Value Ref Range Status   MRSA by PCR NEGATIVE NEGATIVE Final    Comment:        The GeneXpert MRSA Assay (FDA approved for NASAL specimens only), is one component of a comprehensive MRSA colonization surveillance program. It is not intended to diagnose MRSA infection nor to guide or monitor treatment for MRSA infections. Performed at [facility], [address] [place], [place] [address]   C difficile quick scan w PCR reflex     Status: None   Collection Time: 12/24/18  1:54 AM  Result Value Ref Range Status   C Diff antigen NEGATIVE NEGATIVE Final   C Diff toxin NEGATIVE NEGATIVE Final   C Diff interpretation No C. difficile detected.  Final    Comment: Performed at [facility], [address], [place], [place] [address]  Culture, Urine     Status: Abnormal    Collection Time: 12/26/18 10:22 AM  Result Value Ref Range Status   Specimen Description URINE, CATHETERIZED  Final   Special Requests NONE  Final   Culture (A)  Final    <10,000 COLONIES/mL INSIGNIFICANT GROWTH Performed at [facility], [address], [place], [place] [address]    Report Status 12/27/2018 FINAL  Final  Culture, blood (routine x 2)     Status: None (Preliminary result)   Collection Time: 12/26/18 10:47 AM  Result Value Ref Range Status   Specimen Description BLOOD RIGHT ANTECUBITAL  Final   Special Requests   Final    BOTTLES DRAWN AEROBIC ONLY Blood Culture adequate volume   Culture   Final    NO GROWTH 3 DAYS Performed at [facility], [address] [place], [place] [address]    Report Status PENDING  Incomplete  Culture, blood (routine x 2)     Status: None (Preliminary result)   Collection Time: 12/26/18 10:53 AM  Result Value Ref Range Status   Specimen Description BLOOD RIGHT ARM  Final   Special Requests   Final    BOTTLES DRAWN AEROBIC ONLY Blood Culture adequate volume   Culture   Final    NO GROWTH 3 DAYS Performed at [facility], [address], [place], [place] [address]    Report Status PENDING  Incomplete    RADIOLOGY STUDIES/RESULTS: Dg Chest 1 View  Result Date: 12/26/2018 CLINICAL DATA:  Fever.  Extubated. EXAM: CHEST  1 VIEW COMPARISON:  12/23/2018 FINDINGS: Endotracheal tube and nasogastric tube have been removed. Improved but mild and persistent infiltrate/atelectasis in the lower lobes left more than right. No worsening or new finding. IMPRESSION: Endotracheal tube and nasogastric tube removed. Better aeration of the lower lungs. Mild persistent atelectasis/infiltrate in the lower lobes, left more than right. Electronically Signed   By: Paulina Fusi M.D.   On: 12/26/2018 10:43   Dg Chest 2 View  Result Date: 12/17/2018 CLINICAL DATA:  Seizure, fever EXAM: CHEST - 2 VIEW COMPARISON:  09/09/2018 chest radiograph.  FINDINGS: Stable cardiomediastinal silhouette with normal heart size. No pneumothorax. No pleural effusion. Lungs appear clear, with no acute consolidative airspace disease and no pulmonary edema. IMPRESSION: No active cardiopulmonary disease. Electronically Signed   By: Delbert Phenix M.D.   On: 12/17/2018 20:47   Dg Shoulder Right  Result Date: 12/20/2018 CLINICAL DATA:  Right shoulder pain. EXAM: RIGHT SHOULDER - 2+ VIEW COMPARISON:  None. FINDINGS: There is no evidence of fracture or dislocation. There is no evidence of arthropathy or other focal bone abnormality. Soft tissues are unremarkable. IMPRESSION: Negative. Electronically Signed   By: Ted Mcalpine M.D.   On: 12/20/2018 15:59   Ct Head Wo Contrast  Result Date: 12/17/2018 CLINICAL DATA:  Seizure, head trauma EXAM: CT HEAD WITHOUT CONTRAST TECHNIQUE: Contiguous axial images were obtained from the base of the skull through the vertex without intravenous contrast. COMPARISON:  MRI 09/18/2018 FINDINGS: Brain: No acute intracranial abnormality. Specifically, no hemorrhage, hydrocephalus, mass lesion, acute infarction, or significant intracranial injury. Vascular: No hyperdense vessel or unexpected calcification. Skull: No acute calvarial abnormality. Sinuses/Orbits: Visualized paranasal sinuses and mastoids clear. Orbital soft tissues unremarkable. Other: None IMPRESSION: No acute intracranial abnormality. Electronically Signed   By: Charlett Nose M.D.   On: 12/17/2018 20:08   US Renal  Result Date: 12/21/2018 CLINICAL DATA:  Acute kidney injury  EXAM: RENAL / URINARY TRACT ULTRASOUND COMPLETE COMPARISON:  None. FINDINGS: Right Kidney: Renal measurements: 13.8 x 5.8 x 6.3 cm = volume: 268.4 mL . Echogenicity within normal limits. No mass or hydronephrosis visualized. Trace right perinephric fluid. Left Kidney: Renal measurements: 14.3 x 6.4 x 7.6 cm = volume: 371.8 mL. Echogenicity within normal limits. No mass or hydronephrosis visualized.  Bladder: Appears normal for degree of bladder distention. IMPRESSION: Negative renal ultrasound Electronically Signed   By: Jasmine Pang M.D.   On: 12/21/2018 01:17   Dg Chest Port 1 View  Result Date: 12/23/2018 CLINICAL DATA:  Endotracheal tube EXAM: PORTABLE CHEST 1 VIEW COMPARISON:  Yesterday FINDINGS: Endotracheal tube tip is just below the clavicular heads, improved. Orogastric tube at least reaches the stomach. Cardiopericardial enlargement and lower lobe opacities with left-sided air bronchogram. Layering pleural fluid on the right at least. No pneumothorax is seen. IMPRESSION: 1. Improved endotracheal tube positioning. 2. Atelectasis or pneumonia at the bases with layering pleural fluid. Electronically Signed   By: Marnee Spring M.D.   On: 12/23/2018 06:43   Dg Chest Port 1 View  Result Date: 12/22/2018 CLINICAL DATA:  Shortness of breath. EXAM: PORTABLE CHEST 1 VIEW COMPARISON:  12/21/2018 FINDINGS: Endotracheal tube 9.6 cm above the carina. Advancement with approximately 5 cm may be considered. Enteric catheter tip collimated off the image. Cardiomediastinal silhouette is enlarged. Mediastinal contours appear intact. There is no evidence of pneumothorax. Bilateral pleural effusions with lower lobe atelectasis versus airspace consolidation. Interstitial pulmonary edema. Osseous structures are without acute abnormality. Soft tissues are grossly normal. IMPRESSION: 1. Enlarged cardiac silhouette. 2. Interstitial pulmonary edema. 3. Bilateral pleural effusions with lower lobe atelectasis versus airspace consolidation. 4. Endotracheal tube may be advanced by 5 cm. Electronically Signed   By: Ted Mcalpine M.D.   On: 12/22/2018 09:01   Portable Chest X-ray  Result Date: 12/21/2018 CLINICAL DATA:  Endotracheal tube EXAM: PORTABLE CHEST 1 VIEW COMPARISON:  Earlier today FINDINGS: Endotracheal tube tip at the clavicular heads. Orogastric tube with tip reaching the stomach. Interstitial opacity  with now apparent bilateral layering pleural effusions. No pneumothorax. Normal heart size. 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 IMPRESSION: 1. New hardware in unremarkable position. 2. Pulmonary edema and layering pleural effusions. Electronically Signed   By: Marnee Spring M.D.   On: 12/21/2018 09:08   Dg Chest Port 1v Same Day  Result Date: 12/21/2018 CLINICAL DATA:  Shortness of breath EXAM: PORTABLE CHEST 1 VIEW COMPARISON:  Three days ago FINDINGS: Diffuse interstitial opacity that is symmetric. Perihilar airspace opacity. Borderline heart size accentuated by technique. No effusion or pneumothorax IMPRESSION: Pulmonary edema. Electronically Signed   By: Marnee Spring M.D.   On: 12/21/2018 08:28   Dg Chest Port 1v Same Day  Result Date: 12/18/2018 CLINICAL DATA:  Shortness of breath EXAM: PORTABLE CHEST 1 VIEW COMPARISON:  December 17, 2018 FINDINGS: The heart size and mediastinal contours are within normal limits. Both lungs are clear. The visualized skeletal structures are unremarkable. IMPRESSION: No active disease. Electronically Signed   By: Gerome Sam III M.D   On: 12/18/2018 16:01   Vas [place] Upper Extremity Venous Duplex  Result Date: 12/25/2018 UPPER VENOUS STUDY  Indications: Swelling Limitations: Patient positioning, wrist restraints. Performing Technologist: Chanda Busing RVT  Examination Guidelines: A complete evaluation includes B-mode imaging, spectral Doppler, color Doppler, and power Doppler as needed of all accessible portions of each vessel. Bilateral testing is considered an integral part of a complete examination. Limited  examinations for reoccurring indications may be performed as noted.  Right Findings: +----------+------------+---------+-----------+----------+--------------+  RIGHT      Compressible Phasicity Spontaneous Properties    Summary      +----------+------------+---------+-----------+----------+--------------+  IJV            Full        Yes         Yes                                +----------+------------+---------+-----------+----------+--------------+  Subclavian     Full        Yes        Yes                                +----------+------------+---------+-----------+----------+--------------+  Axillary       Full        Yes        Yes                                +----------+------------+---------+-----------+----------+--------------+  Brachial       Full        Yes        Yes                                +----------+------------+---------+-----------+----------+--------------+  Radial         Full                                                      +----------+------------+---------+-----------+----------+--------------+  Ulnar                                                    Not visualized  +----------+------------+---------+-----------+----------+--------------+  Cephalic       Full                                                      +----------+------------+---------+-----------+----------+--------------+  Basilic        Full                                                      +----------+------------+---------+-----------+----------+--------------+  Left Findings: +----------+------------+---------+-----------+----------+-------+  LEFT       Compressible Phasicity Spontaneous Properties Summary  +----------+------------+---------+-----------+----------+-------+  Subclavian     Full        Yes        Yes                         +----------+------------+---------+-----------+----------+-------+  Summary:  Right: No evidence of deep vein thrombosis in the upper extremity. No evidence of superficial  vein thrombosis in the upper extremity.  Left: No evidence of thrombosis in the subclavian.  *See table(s) above for measurements and observations.    Preliminary    US Abdomen Limited Ruq  Result Date: 12/19/2018 CLINICAL DATA:  Elevated LFTs EXAM: ULTRASOUND ABDOMEN LIMITED RIGHT UPPER QUADRANT COMPARISON:  None. FINDINGS:  Gallbladder: Evaluation of the gallbladder is limited. There appears to be a thickened wall measuring up to 5.5 mm. No stones, pericholecystic fluid, or Murphy's sign identified. Common bile duct: Diameter: 4.0 mm Liver: No focal lesion identified. Within normal limits in parenchymal echogenicity. Portal vein is patent on color Doppler imaging with normal direction of blood flow towards the liver. IMPRESSION: 1. The study is limited due to patient condition and difficulty following instructions. The gallbladder is not well assessed as it is contracted. The wall thickening to 5.5 mm could be due to lack of distention. No stones, pericholecystic fluid, or Murphy's sign identified. If there is concern for acute cholecystitis, recommend a HIDA scan. 2. No other abnormalities. Electronically Signed   By: Gerome Sam III M.D   On: 12/19/2018 13:54     LOS: 12 days   Jeoffrey Massed, MD  [facility]  If 7PM-7AM, please contact night-coverage  Please page via [email or web address]  Go to [email or web address] and use [facility]'s universal password to access. If you do not have the password, please contact the hospital operator.  Locate the [facility] provider you are looking for under [facility] and page to a number that you can be directly reached. If you still have difficulty reaching the provider, please page the [facility] (Director on Call) for the Hospitalists listed on amion for assistance.  12/29/2018, 2:52 PM

## 2018-12-30 ENCOUNTER — Inpatient Hospital Stay (HOSPITAL_COMMUNITY): Payer: BLUE CROSS/BLUE SHIELD | Admitting: Certified Registered Nurse Anesthetist

## 2018-12-30 ENCOUNTER — Other Ambulatory Visit: Payer: Self-pay

## 2018-12-30 ENCOUNTER — Inpatient Hospital Stay: Payer: Self-pay

## 2018-12-30 ENCOUNTER — Encounter (HOSPITAL_COMMUNITY)
Admission: EM | Disposition: A | Discharge status: Home Health | Payer: Self-pay | Source: Home / Self Care | Attending: Internal Medicine

## 2018-12-30 DIAGNOSIS — M00011 Staphylococcal arthritis, right shoulder: Secondary | ICD-10-CM

## 2018-12-30 DIAGNOSIS — S40211A Abrasion of right shoulder, initial encounter: Secondary | ICD-10-CM

## 2018-12-30 DIAGNOSIS — L089 Local infection of the skin and subcutaneous tissue, unspecified: Secondary | ICD-10-CM | POA: Insufficient documentation

## 2018-12-30 HISTORY — PX: SHOULDER ARTHROSCOPY: SHX128

## 2018-12-30 HISTORY — PX: IRRIGATION AND DEBRIDEMENT SHOULDER: SHX5880

## 2018-12-30 LAB — CBC
HEMATOCRIT: 29.8 % — AB (ref 39.0–52.0)
Hemoglobin: 10.3 g/dL — ABNORMAL LOW (ref 13.0–17.0)
MCH: 34.7 pg — ABNORMAL HIGH (ref 26.0–34.0)
MCHC: 34.6 g/dL (ref 30.0–36.0)
MCV: 100.3 fL — AB (ref 80.0–100.0)
Platelets: 446 10*3/uL — ABNORMAL HIGH (ref 150–400)
RBC: 2.97 MIL/uL — ABNORMAL LOW (ref 4.22–5.81)
RDW: 11.8 % (ref 11.5–15.5)
WBC: 7.8 10*3/uL (ref 4.0–10.5)
nRBC: 0 % (ref 0.0–0.2)

## 2018-12-30 LAB — CREATININE, SERUM
Creatinine, Ser: 1.13 mg/dL (ref 0.61–1.24)
GFR calc Af Amer: >60 mL/min (ref >60)
GFR calc non Af Amer: >60 mL/min (ref >60)

## 2018-12-30 SURGERY — ARTHROSCOPY, SHOULDER
Anesthesia: General | Site: Shoulder | Laterality: Right

## 2018-12-30 MED ORDER — DEXAMETHASONE SODIUM PHOSPHATE 10 MG/ML IJ SOLN
INTRAMUSCULAR | Status: DC | PRN
  Administered 2018-12-30: 10 mg via INTRAVENOUS

## 2018-12-30 MED ORDER — SUGAMMADEX SODIUM 200 MG/2ML IV SOLN
INTRAVENOUS | Status: DC | PRN
  Administered 2018-12-30: 200 mg via INTRAVENOUS

## 2018-12-30 MED ORDER — VANCOMYCIN HCL 500 MG IV SOLR
INTRAVENOUS | Status: AC
  Filled 2018-12-30: qty 500

## 2018-12-30 MED ORDER — SODIUM CHLORIDE 0.9 % IR SOLN
Status: DC | PRN
  Administered 2018-12-30 (×2): 3000 mL

## 2018-12-30 MED ORDER — MORPHINE SULFATE (PF) 4 MG/ML IV SOLN
INTRAVENOUS | Status: DC | PRN
  Administered 2018-12-30: 8 mg via INTRAMUSCULAR

## 2018-12-30 MED ORDER — PHENYLEPHRINE 40 MCG/ML (10ML) SYRINGE FOR IV PUSH (FOR BLOOD PRESSURE SUPPORT)
PREFILLED_SYRINGE | INTRAVENOUS | Status: DC | PRN
  Administered 2018-12-30 (×3): 80 ug via INTRAVENOUS
  Administered 2018-12-30: 40 ug via INTRAVENOUS

## 2018-12-30 MED ORDER — LIDOCAINE 2% (20 MG/ML) 5 ML SYRINGE
INTRAMUSCULAR | Status: AC
  Filled 2018-12-30: qty 5

## 2018-12-30 MED ORDER — FENTANYL CITRATE (PF) 250 MCG/5ML IJ SOLN
INTRAMUSCULAR | Status: AC
  Filled 2018-12-30: qty 5

## 2018-12-30 MED ORDER — METOCLOPRAMIDE HCL 5 MG/ML IJ SOLN: 5.0000 mg | Freq: Three times a day (TID) | INTRAMUSCULAR | Status: DC | PRN

## 2018-12-30 MED ORDER — MIDAZOLAM HCL 2 MG/2ML IJ SOLN
INTRAMUSCULAR | Status: AC
  Filled 2018-12-30: qty 2

## 2018-12-30 MED ORDER — SACUBITRIL-VALSARTAN 49-51 MG PO TABS
1.0000 | ORAL_TABLET | Freq: Two times a day (BID) | ORAL | Status: DC
  Administered 2018-12-30 – 2019-01-02 (×7): 1 via ORAL
  Filled 2018-12-30 (×8): qty 1

## 2018-12-30 MED ORDER — BUPIVACAINE HCL (PF) 0.25 % IJ SOLN
INTRAMUSCULAR | Status: AC
  Filled 2018-12-30: qty 30

## 2018-12-30 MED ORDER — SODIUM CHLORIDE 0.9 % IV SOLN
INTRAVENOUS | Status: DC | PRN
  Administered 2018-12-30: 50 ug/min via INTRAVENOUS

## 2018-12-30 MED ORDER — DEXMEDETOMIDINE HCL 200 MCG/2ML IV SOLN
INTRAVENOUS | Status: DC | PRN
  Administered 2018-12-30: 12 ug via INTRAVENOUS
  Administered 2018-12-30: 8 ug via INTRAVENOUS

## 2018-12-30 MED ORDER — TRAMADOL HCL 50 MG PO TABS
50.0000 mg | ORAL_TABLET | Freq: Four times a day (QID) | ORAL | Status: DC
  Administered 2018-12-30 – 2019-01-01 (×6): 50 mg via ORAL
  Filled 2018-12-30 (×6): qty 1

## 2018-12-30 MED ORDER — GENTAMICIN SULFATE 40 MG/ML IJ SOLN
INTRAMUSCULAR | Status: AC
  Filled 2018-12-30: qty 4

## 2018-12-30 MED ORDER — BUPIVACAINE HCL (PF) 0.5 % IJ SOLN
INTRAMUSCULAR | Status: DC | PRN
  Administered 2018-12-30: 30 mL

## 2018-12-30 MED ORDER — FENTANYL CITRATE (PF) 250 MCG/5ML IJ SOLN
INTRAMUSCULAR | Status: DC | PRN
  Administered 2018-12-30: 100 ug via INTRAVENOUS
  Administered 2018-12-30: 50 ug via INTRAVENOUS

## 2018-12-30 MED ORDER — LACTATED RINGERS IV SOLN
INTRAVENOUS | Status: DC
  Administered 2018-12-30 (×2): via INTRAVENOUS

## 2018-12-30 MED ORDER — CALCIUM CHLORIDE 10 % IV SOLN
INTRAVENOUS | Status: AC
  Filled 2018-12-30: qty 10

## 2018-12-30 MED ORDER — EPHEDRINE SULFATE-NACL 50-0.9 MG/10ML-% IV SOSY
PREFILLED_SYRINGE | INTRAVENOUS | Status: DC | PRN
  Administered 2018-12-30: 10 mg via INTRAVENOUS

## 2018-12-30 MED ORDER — ONDANSETRON HCL 4 MG/2ML IJ SOLN
INTRAMUSCULAR | Status: AC
  Filled 2018-12-30: qty 2

## 2018-12-30 MED ORDER — MORPHINE SULFATE (PF) 4 MG/ML IV SOLN
INTRAVENOUS | Status: AC
  Filled 2018-12-30: qty 2

## 2018-12-30 MED ORDER — EPINEPHRINE PF 1 MG/ML IJ SOLN
INTRAMUSCULAR | Status: DC | PRN
  Administered 2018-12-30: 1 mg

## 2018-12-30 MED ORDER — PROPOFOL 10 MG/ML IV BOLUS
INTRAVENOUS | Status: AC
  Filled 2018-12-30: qty 20

## 2018-12-30 MED ORDER — DIPHENHYDRAMINE HCL 50 MG/ML IJ SOLN
INTRAMUSCULAR | Status: AC
  Filled 2018-12-30: qty 1

## 2018-12-30 MED ORDER — ACETAMINOPHEN 325 MG PO TABS: 325.0000 mg | ORAL_TABLET | Freq: Four times a day (QID) | ORAL | Status: DC | PRN

## 2018-12-30 MED ORDER — CLONIDINE HCL (ANALGESIA) 100 MCG/ML EP SOLN
EPIDURAL | Status: DC | PRN
  Administered 2018-12-30: 1 mL via INTRA_ARTICULAR

## 2018-12-30 MED ORDER — BUPIVACAINE HCL (PF) 0.5 % IJ SOLN
INTRAMUSCULAR | Status: AC
  Filled 2018-12-30: qty 30

## 2018-12-30 MED ORDER — SODIUM CHLORIDE 0.9 % IV SOLN
2.0000 g | INTRAVENOUS | Status: DC
  Administered 2018-12-30 – 2019-01-02 (×5): 2 g via INTRAVENOUS
  Filled 2018-12-30 (×5): qty 20

## 2018-12-30 MED ORDER — HYDROCODONE-ACETAMINOPHEN 5-325 MG PO TABS: 1.0000 | ORAL_TABLET | ORAL | Status: DC | PRN

## 2018-12-30 MED ORDER — CHLORHEXIDINE GLUCONATE 4 % EX LIQD: 60.0000 mL | Freq: Once | CUTANEOUS | Status: DC

## 2018-12-30 MED ORDER — PROPOFOL 10 MG/ML IV BOLUS
INTRAVENOUS | Status: DC | PRN
  Administered 2018-12-30: 200 mg via INTRAVENOUS
  Administered 2018-12-30: 30 mg via INTRAVENOUS

## 2018-12-30 MED ORDER — MORPHINE SULFATE (PF) 2 MG/ML IV SOLN: 0.5000 mg | INTRAVENOUS | Status: DC | PRN

## 2018-12-30 MED ORDER — ROCURONIUM BROMIDE 10 MG/ML (PF) SYRINGE
PREFILLED_SYRINGE | INTRAVENOUS | Status: DC | PRN
  Administered 2018-12-30: 50 mg via INTRAVENOUS

## 2018-12-30 MED ORDER — POVIDONE-IODINE 10 % EX SWAB: 2.0000 "application " | Freq: Once | CUTANEOUS | Status: DC

## 2018-12-30 MED ORDER — SUCCINYLCHOLINE CHLORIDE 200 MG/10ML IV SOSY
PREFILLED_SYRINGE | INTRAVENOUS | Status: AC
  Filled 2018-12-30: qty 10

## 2018-12-30 MED ORDER — METHOCARBAMOL 1000 MG/10ML IJ SOLN
500.0000 mg | Freq: Four times a day (QID) | INTRAVENOUS | Status: DC | PRN
  Filled 2018-12-30: qty 5

## 2018-12-30 MED ORDER — ONDANSETRON HCL 4 MG PO TABS: 4.0000 mg | ORAL_TABLET | Freq: Four times a day (QID) | ORAL | Status: DC | PRN

## 2018-12-30 MED ORDER — MIDAZOLAM HCL 2 MG/2ML IJ SOLN
INTRAMUSCULAR | Status: DC | PRN
  Administered 2018-12-30: 2 mg via INTRAVENOUS

## 2018-12-30 MED ORDER — SODIUM CHLORIDE (PF) 0.9 % IJ SOLN
INTRAMUSCULAR | Status: DC | PRN
  Administered 2018-12-30: 50 mL

## 2018-12-30 MED ORDER — PHENYLEPHRINE 40 MCG/ML (10ML) SYRINGE FOR IV PUSH (FOR BLOOD PRESSURE SUPPORT)
PREFILLED_SYRINGE | INTRAVENOUS | Status: AC
  Filled 2018-12-30: qty 10

## 2018-12-30 MED ORDER — SUCCINYLCHOLINE CHLORIDE 200 MG/10ML IV SOSY
PREFILLED_SYRINGE | INTRAVENOUS | Status: DC | PRN
  Administered 2018-12-30: 200 mg via INTRAVENOUS

## 2018-12-30 MED ORDER — LACTATED RINGERS IV SOLN
INTRAVENOUS | Status: DC
  Administered 2018-12-30: 23:00:00 via INTRAVENOUS

## 2018-12-30 MED ORDER — ENOXAPARIN SODIUM 40 MG/0.4ML ~~LOC~~ SOLN
40.0000 mg | SUBCUTANEOUS | Status: DC
  Administered 2018-12-31 – 2019-01-02 (×3): 40 mg via SUBCUTANEOUS
  Filled 2018-12-30 (×3): qty 0.4

## 2018-12-30 MED ORDER — GABAPENTIN 300 MG PO CAPS
300.0000 mg | ORAL_CAPSULE | Freq: Three times a day (TID) | ORAL | Status: DC
  Administered 2018-12-30 – 2019-01-02 (×9): 300 mg via ORAL
  Filled 2018-12-30 (×9): qty 1

## 2018-12-30 MED ORDER — DIPHENHYDRAMINE HCL 50 MG/ML IJ SOLN
INTRAMUSCULAR | Status: DC | PRN
  Administered 2018-12-30: 25 mg via INTRAVENOUS

## 2018-12-30 MED ORDER — EPINEPHRINE PF 1 MG/ML IJ SOLN
INTRAMUSCULAR | Status: AC
  Filled 2018-12-30: qty 1

## 2018-12-30 MED ORDER — ONDANSETRON HCL 4 MG/2ML IJ SOLN: 4.0000 mg | Freq: Four times a day (QID) | INTRAMUSCULAR | Status: DC | PRN

## 2018-12-30 MED ORDER — CALCIUM CHLORIDE 10 % IV SOLN
INTRAVENOUS | Status: DC | PRN
  Administered 2018-12-30 (×3): 100 mg via INTRAVENOUS

## 2018-12-30 MED ORDER — DOCUSATE SODIUM 100 MG PO CAPS
100.0000 mg | ORAL_CAPSULE | Freq: Two times a day (BID) | ORAL | Status: DC
  Administered 2018-12-30 – 2018-12-31 (×3): 100 mg via ORAL
  Filled 2018-12-30 (×3): qty 1

## 2018-12-30 MED ORDER — METOCLOPRAMIDE HCL 10 MG PO TABS: 5.0000 mg | ORAL_TABLET | Freq: Three times a day (TID) | ORAL | Status: DC | PRN

## 2018-12-30 MED ORDER — DEXAMETHASONE SODIUM PHOSPHATE 10 MG/ML IJ SOLN
INTRAMUSCULAR | Status: AC
  Filled 2018-12-30: qty 1

## 2018-12-30 MED ORDER — METHOCARBAMOL 500 MG PO TABS: 500.0000 mg | ORAL_TABLET | Freq: Four times a day (QID) | ORAL | Status: DC | PRN

## 2018-12-30 MED ORDER — EPHEDRINE 5 MG/ML INJ
INTRAVENOUS | Status: AC
  Filled 2018-12-30: qty 10

## 2018-12-30 MED ORDER — LIDOCAINE 2% (20 MG/ML) 5 ML SYRINGE
INTRAMUSCULAR | Status: DC | PRN
  Administered 2018-12-30: 50 mg via INTRAVENOUS

## 2018-12-30 MED ORDER — ONDANSETRON HCL 4 MG/2ML IJ SOLN
INTRAMUSCULAR | Status: DC | PRN
  Administered 2018-12-30: 4 mg via INTRAVENOUS

## 2018-12-30 SURGICAL SUPPLY — 66 items
ALCOHOL ISOPROPYL (RUBBING) (MISCELLANEOUS) ×3 IMPLANT
BLADE CUTTER GATOR 3.5 (BLADE) ×3 IMPLANT
BLADE SURG 11 STRL SS (BLADE) ×3 IMPLANT
BUR OVAL 6.0 (BURR) ×3 IMPLANT
BURR OVAL 8 FLU 4.0MM X 13CM (MISCELLANEOUS) ×1
BURR OVAL 8 FLU 4.0X13 (MISCELLANEOUS) ×2 IMPLANT
COVER SURGICAL LIGHT HANDLE (MISCELLANEOUS) ×3 IMPLANT
COVER WAND RF STERILE (DRAPES) ×3 IMPLANT
CUTTER BONE 4.0MM X 13CM (MISCELLANEOUS) ×3 IMPLANT
DISSECTOR 4.0MM X 13CM (MISCELLANEOUS) ×3 IMPLANT
DRAPE IMP U-DRAPE 54X76 (DRAPES) ×3 IMPLANT
DRAPE INCISE IOBAN 66X45 STRL (DRAPES) ×3 IMPLANT
DRAPE STERI 35X30 U-POUCH (DRAPES) ×3 IMPLANT
DRAPE U-SHAPE 47X51 STRL (DRAPES) ×6 IMPLANT
DRSG AQUACEL AG ADV 3.5X 4 (GAUZE/BANDAGES/DRESSINGS) ×3 IMPLANT
DRSG TEGADERM 4X4.75 (GAUZE/BANDAGES/DRESSINGS) ×3 IMPLANT
DURAPREP 26ML APPLICATOR (WOUND CARE) ×3 IMPLANT
DW OUTFLOW CASSETTE/TUBE SET (MISCELLANEOUS) ×3 IMPLANT
ELECT REM PT RETURN 9FT ADLT (ELECTROSURGICAL) ×3
ELECTRODE REM PT RTRN 9FT ADLT (ELECTROSURGICAL) ×1 IMPLANT
FILTER STRAW FLUID ASPIR (MISCELLANEOUS) ×3 IMPLANT
GAUZE SPONGE 4X4 12PLY STRL LF (GAUZE/BANDAGES/DRESSINGS) ×3 IMPLANT
GAUZE XEROFORM 1X8 LF (GAUZE/BANDAGES/DRESSINGS) ×3 IMPLANT
GLOVE BIOGEL PI IND STRL 8 (GLOVE) ×1 IMPLANT
GLOVE BIOGEL PI INDICATOR 8 (GLOVE) ×2
GLOVE SURG ORTHO 8.0 STRL STRW (GLOVE) ×3 IMPLANT
GOWN STRL REUS W/ TWL LRG LVL3 (GOWN DISPOSABLE) ×3 IMPLANT
GOWN STRL REUS W/TWL LRG LVL3 (GOWN DISPOSABLE) ×6
KIT BASIN OR (CUSTOM PROCEDURE TRAY) ×3 IMPLANT
KIT STIMULAN RAPID CURE 5CC (Orthopedic Implant) ×3 IMPLANT
KIT TURNOVER KIT B (KITS) ×3 IMPLANT
MANIFOLD NEPTUNE II (INSTRUMENTS) ×3 IMPLANT
NEEDLE HYPO 25X1 1.5 SAFETY (NEEDLE) ×3 IMPLANT
NEEDLE SPNL 18GX3.5 QUINCKE PK (NEEDLE) ×3 IMPLANT
NS IRRIG 1000ML POUR BTL (IV SOLUTION) ×3 IMPLANT
PACK SHOULDER (CUSTOM PROCEDURE TRAY) ×3 IMPLANT
PAD ABD 8X10 STRL (GAUZE/BANDAGES/DRESSINGS) ×3 IMPLANT
PAD ARMBOARD 7.5X6 YLW CONV (MISCELLANEOUS) ×6 IMPLANT
PORT APPOLLO RF 90DEGREE MULTI (SURGICAL WAND) ×3 IMPLANT
RESTRAINT HEAD UNIVERSAL NS (MISCELLANEOUS) ×3 IMPLANT
SET ARTHROSCOPY TUBING (MISCELLANEOUS) ×2
SET ARTHROSCOPY TUBING LN (MISCELLANEOUS) ×1 IMPLANT
SLING ARM FOAM STRAP LRG (SOFTGOODS) IMPLANT
SLING ARM IMMOBILIZER LRG (SOFTGOODS) ×3 IMPLANT
SPONGE LAP 4X18 RFD (DISPOSABLE) ×3 IMPLANT
SUCTION FRAZIER HANDLE 10FR (MISCELLANEOUS)
SUCTION TUBE FRAZIER 10FR DISP (MISCELLANEOUS) IMPLANT
SUT ETHILON 3 0 PS 1 (SUTURE) ×18 IMPLANT
SUT PROLENE 3 0 PS 2 (SUTURE) IMPLANT
SUT VIC AB 0 CT1 27 (SUTURE) ×2
SUT VIC AB 0 CT1 27XBRD ANBCTR (SUTURE) ×1 IMPLANT
SUT VIC AB 1 CT1 27 (SUTURE) ×2
SUT VIC AB 1 CT1 27XBRD ANBCTR (SUTURE) ×1 IMPLANT
SUT VIC AB 2-0 CT1 27 (SUTURE) ×4
SUT VIC AB 2-0 CT1 TAPERPNT 27 (SUTURE) ×2 IMPLANT
SUT VICRYL 0 UR6 27IN ABS (SUTURE) IMPLANT
SWAB COLLECTION DEVICE MRSA (MISCELLANEOUS) ×6 IMPLANT
SWAB CULTURE ESWAB REG 1ML (MISCELLANEOUS) ×6 IMPLANT
SYR 20CC LL (SYRINGE) ×9 IMPLANT
SYR 30ML LL (SYRINGE) ×3 IMPLANT
SYR TB 1ML LUER SLIP (SYRINGE) ×3 IMPLANT
TOWEL OR 17X24 6PK STRL BLUE (TOWEL DISPOSABLE) ×3 IMPLANT
TOWEL OR 17X26 10 PK STRL BLUE (TOWEL DISPOSABLE) ×3 IMPLANT
TUBING ARTHROSCOPY IRRIG 16FT (MISCELLANEOUS) ×3 IMPLANT
WAND HAND CNTRL MULTIVAC 90 (MISCELLANEOUS) ×3 IMPLANT
WATER STERILE IRR 1000ML POUR (IV SOLUTION) ×3 IMPLANT

## 2018-12-30 NOTE — Progress Notes (Signed)
NCM received consult for medication cost, Entresto cost. Pt with health insurance prescription coverage...limited assistance available. NCM will provide pt with 30 day freecoupon card and $ 10.00 copay card. Gae Gallop RN,BSN,CM

## 2018-12-30 NOTE — Transfer of Care (Signed)
Immediate Anesthesia Transfer of Care Note  Patient: Darren Diaz  Procedure(s) Performed: ARTHROSCOPIC IRRIGATION AND DEBRIDEMENT OF SHOULDER JOINT (Right Shoulder) Irrigation And Debridement Biceps Tendon Sheath (Right Shoulder)  Patient Location: PACU  Anesthesia Type:General  Level of Consciousness: awake, alert  and oriented  Airway & Oxygen Therapy: Patient Spontanous Breathing and Patient connected to face mask oxygen  Post-op Assessment: Report given to RN and Post -op Vital signs reviewed and stable  Post vital signs: Reviewed and stable  Last Vitals:  Vitals Value Taken Time  BP 127/74 12/30/2018  3:05 PM  Temp 36.4 C 12/30/2018  3:05 PM  Pulse 72 12/30/2018  3:13 PM  Resp 18 12/30/2018  3:13 PM  SpO2 94 % 12/30/2018  3:13 PM  Vitals shown include unvalidated device data.  Last Pain:  Vitals:   12/30/18 1505  TempSrc:   PainSc: 0-No pain      Patients Stated Pain Goal: 2 (12/30/18 1153)  Complications: No apparent anesthesia complications

## 2018-12-30 NOTE — Anesthesia Procedure Notes (Signed)
Procedure Name: Intubation Date/Time: 12/30/2018 12:30 PM Performed by: Valda Favia, CRNA Pre-anesthesia Checklist: Patient identified, Emergency Drugs available, Suction available, Patient being monitored and Timeout performed Patient Re-evaluated:Patient Re-evaluated prior to induction Oxygen Delivery Method: Circle system utilized Preoxygenation: Pre-oxygenation with 100% oxygen Induction Type: IV induction and Rapid sequence Laryngoscope Size: Mac and 4 Grade View: Grade I Tube type: Oral Tube size: 7.5 mm Number of attempts: 1 Airway Equipment and Method: Stylet Placement Confirmation: ETT inserted through vocal cords under direct vision,  positive ETCO2 and breath sounds checked- equal and bilateral Secured at: 23 cm Tube secured with: Tape Dental Injury: Teeth and Oropharynx as per pre-operative assessment

## 2018-12-30 NOTE — Progress Notes (Signed)
Patient ID: Darren Diaz, male   DOB: 04-Dec-1985, 33 y.o.   MRN: [id number]         [facility]  Date of Admission:  12/17/2018    Total days of antibiotics 13                 ASSESSMENT: He had recent right leg cellulitis complicated by group A streptococcal bacteremia.  It is now evident that he also has right shoulder infection.   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  He will probably need 6 weeks of postoperative IV antibiotic therapy. I recommended switching back to ceftriaxone.  His recent blood cultures are negative.  He can have a single-lumen PICC line placed at any time.   PLAN: 1. Continue ceftriaxone 2. Okay for PICC placement 3. Please call Dr. Daiva Eves [phone]) for any infectious disease questions this weekend   Active Problems:   Acute respiratory failure (HCC)   Alcohol withdrawal seizure, with unspecified complication (HCC)   Bacterial infection due to streptococcus, group A   Nonischemic cardiomyopathy (HCC)   Pressure injury of skin   Right shoulder pain   AKI (acute kidney injury) (HCC)   Acute combined systolic and diastolic heart failure (HCC)   Scheduled Meds: . [MAR Hold] carvedilol  12.5 mg Oral BID WC  . chlorhexidine  60 mL Topical Once  . [MAR Hold] cloNIDine  0.1 mg Oral QAC breakfast  . [MAR Hold] enoxaparin (LOVENOX) injection  40 mg Subcutaneous Q24H  . [MAR Hold] feeding supplement (ENSURE ENLIVE)  237 mL Oral BID BM  . [MAR Hold] folic acid  1 mg Oral Daily  . [MAR Hold] gabapentin  300 mg Oral BID  . [MAR Hold] isosorbide dinitrate  10 mg Oral TID  . [MAR Hold] levETIRAcetam  1,000 mg Oral BID  . [MAR Hold] mouth rinse  15 mL Mouth Rinse BID  . [MAR Hold] multivitamin with minerals  1 tablet Oral Daily  . [MAR Hold] nicotine  21 mg Transdermal Daily  . [MAR Hold] pantoprazole  40 mg Oral Daily  . povidone-iodine  2 application Topical Once  . [MAR Hold] vitamin B-6  100 mg Oral  Daily  . [MAR Hold] sacubitril-valsartan  1 tablet Oral BID  . [MAR Hold] thiamine  100 mg Oral Daily   Continuous Infusions: . [MAR Hold] cefTRIAXone (ROCEPHIN)  IV    . lactated ringers 10 mL/hr at 12/27/18 1800  . lactated ringers 10 mL/hr at 12/30/18 1145   PRN Meds:.[MAR Hold] acetaminophen, [MAR Hold] bisacodyl, EPINEPHrine, [MAR Hold] Gerhardt's butt cream, [MAR Hold] haloperidol lactate, [MAR Hold] hydrALAZINE, [MAR Hold] levalbuterol, [DISCONTINUED] ondansetron **OR** [MAR Hold] ondansetron (ZOFRAN) IV, [MAR Hold] oxyCODONE, [MAR Hold] phenol, [MAR Hold] sennosides, sodium chloride (PF), sodium chloride irrigation   SUBJECTIVE: Dr. Jerral Ralph notified me this morning that Darren Diaz had an MRI of his right shoulder.  It revealed:  Large glenohumeral joint effusion with intense synovial enhancement and thickening highly suspicious for septic glenohumeral joint.  Marrow edema in the posterior aspect of the humeral head could be related to rotator cuff tendinopathy but may also reflect osteomyelitis.  Rim enhancing fluid collection along the medial margin of the biceps tendon distal to the bicipital groove extends below the inferior margin of the study and is most consistent with abscess.  Edema and enhancement within the muscle belly of the posterior infraspinatus and anterior subscapularis are consistent with Myositis.  He is currently  in the OR.  Review of Systems:  No Known Allergies  OBJECTIVE: Vitals:   12/30/18 0558 12/30/18 0825 12/30/18 1000 12/30/18 1153  BP: 137/85 133/81 (!) 142/86   Pulse: 67 79 71   Resp: 18  16   Temp: 99.3 F (37.4 C)  98.7 F (37.1 C)   TempSrc: Oral  Oral   SpO2: 98%  96%   Weight: 89.8 kg   89.8 kg  Height:    5\' 11"  (1.803 m)   Body mass index is 27.61 kg/m.  Physical Exam Constitutional:      Comments: He is currently in the OR and unavailable for examination.     Lab Results Lab Results  Component Value Date    WBC 7.7 12/28/2018   HGB 9.9 (L) 12/28/2018   HCT 29.8 (L) 12/28/2018   MCV 102.4 (H) 12/28/2018   PLT 528 (H) 12/28/2018    Lab Results  Component Value Date   CREATININE 1.27 (H) 12/28/2018   BUN 13 12/28/2018   NA 137 12/28/2018   K 4.5 12/28/2018   CL 103 12/28/2018   CO2 26 12/28/2018    Lab Results  Component Value Date   ALT 37 12/28/2018   AST 34 12/28/2018   ALKPHOS 44 12/28/2018   BILITOT 0.9 12/28/2018     Microbiology: Recent Results (from the past 240 hour(s))  MRSA PCR Screening     Status: None   Collection Time: 12/21/18  8:38 AM  Result Value Ref Range Status   MRSA by PCR NEGATIVE NEGATIVE Final    Comment:        The GeneXpert MRSA Assay (FDA approved for NASAL specimens only), is one component of a comprehensive MRSA colonization surveillance program. It is not intended to diagnose MRSA infection nor to guide or monitor treatment for MRSA infections. Performed at [facility], [address], [place], [place] [address]   C difficile quick scan w PCR reflex     Status: None   Collection Time: 12/24/18  1:54 AM  Result Value Ref Range Status   C Diff antigen NEGATIVE NEGATIVE Final   C Diff toxin NEGATIVE NEGATIVE Final   C Diff interpretation No C. difficile detected.  Final    Comment: Performed at [facility], [address], [place], [place] [address]  Culture, Urine     Status: Abnormal   Collection Time: 12/26/18 10:22 AM  Result Value Ref Range Status   Specimen Description URINE, CATHETERIZED  Final   Special Requests NONE  Final   Culture (A)  Final    <10,000 COLONIES/mL INSIGNIFICANT GROWTH Performed at [facility], [address] [place], [place] [address]    Report Status 12/27/2018 FINAL  Final  Culture, blood (routine x 2)     Status: None (Preliminary result)   Collection Time: 12/26/18 10:47 AM  Result Value Ref Range Status   Specimen Description BLOOD RIGHT ANTECUBITAL  Final   Special Requests    Final    BOTTLES DRAWN AEROBIC ONLY Blood Culture adequate volume   Culture   Final    NO GROWTH 4 DAYS Performed at [facility], [address], [place], [place] [address]    Report Status PENDING  Incomplete  Culture, blood (routine x 2)     Status: None (Preliminary result)   Collection Time: 12/26/18 10:53 AM  Result Value Ref Range Status   Specimen Description BLOOD RIGHT ARM  Final  Special Requests   Final    BOTTLES DRAWN AEROBIC ONLY Blood Culture adequate volume   Culture   Final    NO GROWTH 4 DAYS Performed at [facility], [address], [place], [place] [address]    Report Status PENDING  Incomplete    Cliffton Asters, MD [facility] [phone] pager   [phone] cell 12/30/2018, 1:35 PM

## 2018-12-30 NOTE — Brief Op Note (Signed)
12/17/2018 - 12/30/2018  2:43 PM  PATIENT:  Darren Diaz  33 y.o. male  PRE-OPERATIVE DIAGNOSIS:  Septic right shoulder  POST-OPERATIVE DIAGNOSIS:  Septic right shoulder  PROCEDURE:  Procedure(s): ARTHROSCOPIC IRRIGATION AND extensive DEBRIDEMENT OF SHOULDER JOINT Open Irrigation And  Excisional Debridement Biceps Tendon Sheath  SURGEON:  Surgeon(s): August Saucer, Corrie Mckusick, MD  ASSISTANT: Tresa Endo rnfa  ANESTHESIA:   general  EBL: 10 ml    Total I/O In: 1000 [I.V.:1000] Out: -   BLOOD ADMINISTERED: none  DRAINS: none   LOCAL MEDICATIONS USED:  stimulan beads  SPECIMEN:  cxs x 2  COUNTS:  YES  TOURNIQUET:  * No tourniquets in log *  DICTATION: .Other Dictation: Dictation Number [id number]  PLAN OF CARE: Admit to inpatient   PATIENT DISPOSITION:  PACU - hemodynamically stable

## 2018-12-30 NOTE — Progress Notes (Addendum)
Progress Note  Patient Name: Darren Diaz Date of Encounter: 12/30/2018  Primary Cardiologist: Nanetta Batty, MD   Subjective   No chest pain or SOB.  Weak R shoulder is septic, for procedure today  Inpatient Medications    Scheduled Meds: . amoxicillin  500 mg Oral Q8H  . carvedilol  12.5 mg Oral BID WC  . cloNIDine  0.1 mg Oral BH-qamhs   Followed by  . [START ON 12/31/2018] cloNIDine  0.1 mg Oral QAC breakfast  . enoxaparin (LOVENOX) injection  40 mg Subcutaneous Q24H  . feeding supplement (ENSURE ENLIVE)  237 mL Oral BID BM  . folic acid  1 mg Oral Daily  . gabapentin  300 mg Oral BID  . isosorbide dinitrate  10 mg Oral TID  . levETIRAcetam  1,000 mg Oral BID  . mouth rinse  15 mL Mouth Rinse BID  . multivitamin with minerals  1 tablet Oral Daily  . nicotine  21 mg Transdermal Daily  . pantoprazole  40 mg Oral Daily  . vitamin B-6  100 mg Oral Daily  . sacubitril-valsartan  1 tablet Oral BID  . thiamine  100 mg Oral Daily   Continuous Infusions: . lactated ringers 10 mL/hr at 12/27/18 1800   PRN Meds: acetaminophen, bisacodyl, Gerhardt's butt cream, haloperidol lactate, hydrALAZINE, levalbuterol, [DISCONTINUED] ondansetron **OR** ondansetron (ZOFRAN) IV, oxyCODONE, phenol, sennosides   Vital Signs    Vitals:   12/29/18 1254 12/29/18 2215 12/30/18 0558 12/30/18 0825  BP: 132/88 107/68 137/85 133/81  Pulse: 77 75 67 79  Resp: 18 18 18    Temp: 97.9 F (36.6 C) 99.3 F (37.4 C) 99.3 F (37.4 C)   TempSrc: Oral Oral Oral   SpO2: 98% 96% 98%   Weight:   89.8 kg   Height:        Intake/Output Summary (Last 24 hours) at 12/30/2018 0930 Last data filed at 12/30/2018 2542 Gross per 24 hour  Intake 480 ml  Output 425 ml  Net 55 ml   Last 3 Weights 12/30/2018 12/29/2018 12/27/2018  Weight (lbs) 197 lb 15.6 oz 196 lb 3.4 oz 211 lb 13.8 oz  Weight (kg) 89.8 kg 89 kg 96.1 kg      Telemetry    SR, No sig ectopy- Personally Reviewed  Physical Exam    General: Well developed, well nourished, male in no acute distress Head: Eyes PERRLA, No xanthomas.   Normocephalic and atraumatic Lungs: Clear bilaterally to auscultation. Heart: HRRR S1 S2, without MRG.  Pulses are 2+ & equal. No JVD. Abdomen: Bowel sounds are present, abdomen soft and non-tender without masses or  hernias noted. Msk: Normal strength and tone for age. R arm painful to move Extremities: No clubbing, cyanosis or edema.    Skin:  No rashes or lesions noted. Neuro: Alert and oriented X 3. + intentional tremors Psych:  Good affect, responds appropriately  Labs    Chemistry Recent Labs  Lab 12/25/18 0428 12/26/18 0421 12/27/18 0219 12/28/18 0353  NA 137 139 138 137  K 4.7 4.6 3.9 4.5  CL 113* 115* 110 103  CO2 16* 17* 21* 26  GLUCOSE 92 99 105* 95  BUN 17 16 13 13   CREATININE 1.38* 1.45* 1.48* 1.27*  CALCIUM 8.0* 8.4* 8.4* 8.7*  PROT 6.1* 6.5  --  6.3*  ALBUMIN 2.3* 2.3*  --  2.2*  AST 38 38  --  34  ALT 58* 46*  --  37  ALKPHOS 49 47  --  44  BILITOT 1.5* 1.3*  --  0.9  GFRNONAA >60 >60 >60 >60  GFRAA >60 >60 >60 >60  ANIONGAP 8 7 7 8      Hematology Recent Labs  Lab 12/26/18 0421 12/27/18 0219 12/28/18 0353  WBC 7.3 7.5  7.6 7.7  RBC 3.23* 2.96*  2.94* 2.91*  HGB 11.4* 10.1*  10.1* 9.9*  HCT 33.5* 30.6*  29.9* 29.8*  MCV 103.7* 103.4*  101.7* 102.4*  MCH 35.3* 34.1*  34.4* 34.0  MCHC 34.0 33.0  33.8 33.2  RDW 13.2 12.8  12.6 12.0  PLT 424* 490*  480* 528*    Radiology    Mr Shoulder Right W Wo Contrast  Result Date: 12/29/2018 CLINICAL DATA:  Fever and sepsis. Strep pyogenes bacteremia. Right lower extremity cellulitis. Right shoulder swelling and pain. Question infection. EXAM: MRI OF THE RIGHT SHOULDER WITHOUT AND WITH CONTRAST TECHNIQUE: Multiplanar, multisequence MR imaging of the right shoulder was performed before and after the administration of intravenous contrast. CONTRAST:  10 cc Gadavist IV. COMPARISON:  None. FINDINGS:  Rotator cuff: There is some thickening and heterogeneously increased T2 signal in the rotator cuff tendons consistent with tendinopathy. No tear. Muscles: Edema and mild enhancement are seen in the posterior subscapularis and infraspinatus muscle bellies. Biceps long head: Intact. There is fluid in the tendon sheath of the long head of biceps. A rim enhancing fluid collection along the medial margin of the biceps tendon distal to the bicipital groove measures up to 1.2 cm AP by 1.2 cm transverse. The collection extends below the inferior margin of the scan and may involve the biceps muscle. Acromioclavicular Joint: Mild degenerative change is present. Type 1 acromion. Only a small amount of fluid is seen in the subacromial/subdeltoid bursa. Glenohumeral Joint: The patient has a moderately large glenohumeral joint effusion with marked synovial thickening and enhancement. Labrum:  The superior labrum is severely degenerated. Bones: There is marrow edema and enhancement in the posterior aspect of the humeral head. Subchondral edema is seen in the anterior, inferior glenoid. No fracture. Other: None. IMPRESSION: Large glenohumeral joint effusion with intense synovial enhancement and thickening highly suspicious for septic glenohumeral joint. Marrow edema in the posterior aspect of the humeral head could be related to rotator cuff tendinopathy but may also reflect osteomyelitis. Rim enhancing fluid collection along the medial margin of the biceps tendon distal to the bicipital groove extends below the inferior margin of the study and is most consistent with abscess. Edema and enhancement within the muscle belly of the posterior infraspinatus and anterior subscapularis are consistent with myositis. Electronically Signed   By: Drusilla Kanner M.D.   On: 12/29/2018 15:33    Cardiac Studies   Echo: 1. The left ventricle has a visually estimated ejection fraction of of 35%. The cavity size was mildly dilated. Left  ventricular diastolic Doppler parameters are consistent with impaired relaxation. Left ventricular diffuse hypokinesis.  2. The right ventricle has normal systolic function. The cavity was normal. There is no increase in right ventricular wall thickness.  3. No evidence of mitral valve stenosis. Trivial mitral regurgitation.  4. The aortic valve is tricuspid no stenosis of the aortic valve.  5. The aortic root and ascending aorta are normal in size and structure.  6. The inferior vena cava was dilated in size with <50% respiratory variability. No complete TR doppler jet so unable to estimate PA systolic pressure.  FINDINGS  Left Ventricle: The left ventricle has a visually estimated ejection fraction of of  35%. The cavity size was mildly dilated. There is no increase in left ventricular wall thickness. Left ventricular diastolic Doppler parameters are consistent with  impaired relaxation. Left ventricular diffuse hypokinesis. Right Ventricle: The right ventricle has normal systolic function. The cavity was normal. There is no increase in right ventricular wall thickness. Left Atrium: left atrial size was normal in size Right Atrium: right atrial size was normal in size. Right atrial pressure is estimated at 15 mmHg. Interatrial Septum: No atrial level shunt detected by color flow Doppler. Pericardium: There is no evidence of pericardial effusion. Mitral Valve: The mitral valve is normal in structure. Mitral valve regurgitation is trivial by color flow Doppler. No evidence of mitral valve stenosis. Tricuspid Valve: The tricuspid valve is normal in structure. Tricuspid valve regurgitation is trivial by color flow Doppler. Aortic Valve: The aortic valve is tricuspid Aortic valve regurgitation was not visualized by color flow Doppler. There is no stenosis of the aortic valve. Pulmonic Valve: The pulmonic valve was normal in structure. Pulmonic valve regurgitation is not visualized by color flow  Doppler. Aorta: The aortic root and ascending aorta are normal in size and structure. Venous: The inferior vena cava is dilated in size with less than 50% respiratory variability.    Patient Profile     33 y.o. male with a hx of PDA repair (age 56 at [facility] state), seizure disorder due heavy alcohol drinking and tobacco smokingwho is being seen for the evaluation of Low EFat the request of Dr. Jerral Ralph.  Assessment & Plan    1. Acute systolic CHF: EF 71% - felt 2nd ETOH (per father, has multiple addictions) - father looking into ways to get him a stable outpt environment - tolerating isordil and Coreg, per Dr Royann Shivers, do not increase Isordil w/ou hydralazine - per Dr Erin Hearing note, increase Entresto as clonidine is weaned - clonidine is now qd, so will go to 49-51 mg bid - add aldactone once Entresto is maximized  2. CKD II:  - mgt per IM - Improving  3. HTN - SBP 100s-130s but generally > 130 - feel he will tolerate the higher dose Entresto   We will follow with you as he progresses.  For questions or updates, please contact [facility] Please consult [email or web address] for contact info under       Signed, Theodore Demark, PA-C  12/30/2018, 9:30 AM    Attending Note:   The patient was seen and examined.  Agree with assessment and plan as noted above.  Changes made to the above note as needed.  Patient seen and independently examined with Theodore Demark, PA .   We discussed all aspects of the encounter. I agree with the assessment and plan as stated above.  1.   Pt is currently in a procedure. Agree with plan    I have spent a total of 40 minutes with patient reviewing hospital  notes , telemetry, EKGs, labs and examining patient as well as establishing an assessment and plan that was discussed with the patient. > 50% of time was spent in direct patient care.     Vesta Mixer, Montez Hageman., MD, [facility] 12/30/2018, 11:38 AM [address],  [address] Office [phone] Pager [phone]

## 2018-12-30 NOTE — Op Note (Signed)
NAMELUMEN, GIRARD MEDICAL RECORD WG:[id number] ACCOUNT [id number] DATE OF BIRTH:1986/09/07 FACILITY: [facility] LOCATION: [facility] PHYSICIAN:GREGORY Diamantina Providence, MD  OPERATIVE REPORT  DATE OF PROCEDURE:  12/30/2018  PREOPERATIVE DIAGNOSIS:  Right infected shoulder joint with biceps tendon sheath phlegmon.  POSTOPERATIVE DIAGNOSIS:  Right infected shoulder joint with biceps tendon sheath phlegmon.  PROCEDURE:  Right shoulder open excision of the biceps tendon sheath phlegmon below and around the pectoralis major insertion with arthroscopic I and D and extensive debridement with placement of Stimulan beads.  SURGEON:  Cammy Copa, MD  ASSISTANT:  Tresa Endo, RNFA.  INDICATIONS:  Jaise is a 33 year old patient with right shoulder pain.  He presents for operative management after explanation of risks and benefits.  Intraoperative cultures obtained x2, 1 set from the joint surface and 1 set from the biceps tendon  sheath.  INDICATIONS: Branton is a patient with right shoulder infected joint confirmed by MRI scan who presents for operative management after explanation of risks and benefits.  PROCEDURE IN DETAIL:  The patient was brought to the operating room where general anesthetic was induced.  Preoperative antibiotics were administered after cultures were obtained.  The patient had been on antibiotics previously.  Right shoulder was  prescrubbed with alcohol and Betadine and allowed to air dry.  Prep with DuraPrep solution and draped in sterile manner.  Ioban used to cover the axilla.  The timeout was called.  The patient's head was in neutral position.  An incision was made,  deltopectoral type approach, but over a smaller area and over the midportion of the deltoid.  Skin and subcutaneous tissue were sharply divided.  The deltoid and pectoralis split was made and developed bluntly.  Self-retaining retractors were placed.   The incision was about a 5 cm.  The biceps tendon sheath was identified and  incised.  Cultures were obtained from this area.  Phlegmon was also obtained and sent with cultures.  No abscess present, but there were significant issues with phlegmon tissue  around that biceps tendon sheath.  This was removed both sharply and with a curette.  Thorough irrigation was then performed.  Attention then directed towards the shoulder joint itself.  A spinal needle placed through a posterior portal 2 cm medial and  inferior to the posterolateral margin of the acromion.  The shoulder joint fluid was then sent for culture x2.  Posterior portal was then made.  Skin and subcutaneous tissue were sharply divided and the arthroscopic trocar was placed.  Two anterior  portals were then created under direct visualization.  Extensive debridement was performed with the shaver of the soft tissue surfaces.  Cloudy fluid was present.  There was also what appeared to be a film on the joint surfaces as well as the soft tissue  surfaces within the joint capsule.  These were debrided with the shaver except for the joint surface, which was debrided with irrigating solution.  In total  17 liters of irrigating solution was used to irrigate the shoulder joint after extensive debridement.  After irrigation, Stimulan beads were placed.  At this time, Stimulan beads also placed in the anterior incision.  The portals were closed using 3-0  nylon.  The deltopectoral interval was closed using 0 Vicryl suture, 2-0 Vicryl suture and a 3-0 nylon to close the skin loosely.  Impervious dressing was placed along with a shoulder immobilizer.    The patient tolerated the procedure well without immediate complications.  All questions answered.    Stimulan beads contained both  vancomycin and gentamicin.  AN/NUANCE  D:12/30/2018 T:12/30/2018 JOB:[id number]

## 2018-12-30 NOTE — Consult Note (Signed)
Reason for Consult:Septic shoulder Referring Physician: S Ghimire  Darren Diaz is an 33 y.o. male.  HPI: Darren Diaz was admitted 2 weeks ago following a breakthrough alcoholic seizure. Since then he has had a rocky hospital course with DT's leading to respiratory failure and intubation and sepsis 2/2 bacteremia. He was extubated 3/13 and once his encephalopathy cleared began to c/o right shoulder pain. He has had a long hx/o right shoulder pain that waxes and wanes and so wasn't especially worried about it. Over the following several days the pain seemed to worsen significantly though still was waxing and waning in intensity. X-rays were negative and a MRI was obtained that appeared to show a septic glenohumeral joint and orthopedic surgery was consulted.  Past Medical History:  Diagnosis Date  . Alcohol abuse   . S/P PDA repair age 33  . Seizures ([facility]CC) 08/2018  . Tobacco abuse     Past Surgical History:  Procedure Laterality Date  . CHL IP CC INTUBATION ORDABLE  12/21/2018      . 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 Fibromyalgia Mother     Social History:  reports that he has been smoking cigarettes. He has never used smokeless tobacco. He reports current alcohol use of about 6.0 standard drinks of alcohol per week. He reports that he does not use drugs.  Allergies: No Known Allergies  Medications: I have reviewed the patient's current medications.  No results found for this or any previous visit (from the past 48 hour(s)).  Mr Shoulder Right W Wo Contrast  Result Date: 12/29/2018 CLINICAL DATA:  Fever and sepsis. Strep pyogenes bacteremia. Right lower extremity cellulitis. Right shoulder swelling and pain. Question infection. EXAM: MRI OF THE RIGHT SHOULDER WITHOUT AND WITH CONTRAST TECHNIQUE: Multiplanar, multisequence MR imaging of the right shoulder was performed before and after the administration of intravenous  contrast. CONTRAST:  10 cc Gadavist IV. COMPARISON:  None. FINDINGS: Rotator cuff: There is some thickening and heterogeneously increased T2 signal in the rotator cuff tendons consistent with tendinopathy. No tear. Muscles: Edema and mild enhancement are seen in the posterior subscapularis and infraspinatus muscle bellies. Biceps long head: Intact. There is fluid in the tendon sheath of the long head of biceps. A rim enhancing fluid collection along the medial margin of the biceps tendon distal to the bicipital groove measures up to 1.2 cm AP by 1.2 cm transverse. The collection extends below the inferior margin of the scan and may involve the biceps muscle. Acromioclavicular Joint: Mild degenerative change is present. Type 1 acromion. Only a small amount of fluid is seen in the subacromial/subdeltoid bursa. Glenohumeral Joint: The patient has a moderately large glenohumeral joint effusion with marked synovial thickening and enhancement. Labrum:  The superior labrum is severely degenerated. Bones: There is marrow edema and enhancement in the posterior aspect of the humeral head. Subchondral edema is seen in the anterior, inferior glenoid. No fracture. Other: None. IMPRESSION: Large glenohumeral joint effusion with intense synovial enhancement and thickening highly suspicious for septic glenohumeral joint. Marrow edema in the posterior aspect of the humeral head could be related to rotator cuff tendinopathy but may also reflect osteomyelitis. Rim enhancing fluid collection along the medial margin of the biceps tendon distal to the bicipital groove extends below the inferior margin of the study and is most consistent with abscess. Edema and enhancement within the muscle belly of the posterior infraspinatus  and anterior subscapularis are consistent with myositis. Electronically Signed   By: Drusilla Kanner M.D.   On: 12/29/2018 15:33    Review of Systems  Constitutional: Positive for fever. Negative for chills and  weight loss.  HENT: Negative for ear discharge, ear pain, hearing loss and tinnitus.   Eyes: Negative for blurred vision, double vision, photophobia and pain.  Respiratory: Negative for cough, sputum production and shortness of breath.   Cardiovascular: Negative for chest pain.  Gastrointestinal: Negative for abdominal pain, nausea and vomiting.  Genitourinary: Negative for dysuria, flank pain, frequency and urgency.  Musculoskeletal: Positive for joint pain (Right shoulder). Negative for back pain, falls, myalgias and neck pain.  Neurological: Negative for dizziness, tingling, sensory change, focal weakness, loss of consciousness and headaches.  Endo/Heme/Allergies: Does not bruise/bleed easily.  Psychiatric/Behavioral: Negative for depression, memory loss and substance abuse. The patient is not nervous/anxious.    Blood pressure 133/81, pulse 79, temperature 99.3 F (37.4 C), temperature source Oral, resp. rate 18, height 5\' 10"  (1.778 m), weight 89.8 kg, SpO2 98 %. Physical Exam  Constitutional: He appears well-developed and well-nourished. No distress.  HENT:  Head: Normocephalic and atraumatic.  Eyes: Conjunctivae are normal. Right eye exhibits no discharge. Left eye exhibits no discharge. No scleral icterus.  Neck: Normal range of motion.  Cardiovascular: Normal rate and regular rhythm.  Respiratory: Effort normal. No respiratory distress.  Musculoskeletal:     Comments: Right shoulder, elbow, wrist, digits- no skin wounds, shoulder generally mild-mod TTP, exquisite TTP with AROM/PROM, no instability, no blocks to motion  Sens  Ax/R/M/U intact  Mot   Ax/ R/ PIN/ M/ AIN/ U intact  Rad 2+  Neurological: He is alert.  Skin: Skin is warm and dry. He is not diaphoretic.  Psychiatric: He has a normal mood and affect. His behavior is normal.    Assessment/Plan: Right septic shoulder -- Plan arthroscopic washout later this morning by Dr. August Saucer. NPO until then. EtOH abuse Seizure  disorder    Freeman Caldron, PA-C Orthopedic Surgery [phone] 12/30/2018, 8:51 AM

## 2018-12-30 NOTE — Anesthesia Preprocedure Evaluation (Addendum)
Anesthesia Evaluation  Patient identified by MRN, date of birth, ID band Patient awake    Reviewed: Allergy & Precautions, NPO status , Patient's Chart, lab work & pertinent test results  Airway Mallampati: II  TM Distance: >3 FB Neck ROM: Full    Dental no notable dental hx. (+) Teeth Intact, Dental Advisory Given   Pulmonary neg pulmonary ROS, Current Smoker,    Pulmonary exam normal breath sounds clear to auscultation       Cardiovascular negative cardio ROS Normal cardiovascular exam Rhythm:Regular Rate:Normal  TTE 12/2018  1. The left ventricle has a visually estimated ejection fraction of of 35%. The cavity size was mildly dilated. Left ventricular diastolic Doppler parameters are consistent with impaired relaxation. Left ventricular diffuse hypokinesis.  2. The right ventricle has normal systolic function. The cavity was normal. There is no increase in right ventricular wall thickness.  3. No evidence of mitral valve stenosis. Trivial mitral regurgitation.  4. The aortic valve is tricuspid no stenosis of the aortic valve.  5. The aortic root and ascending aorta are normal in size and structure.  6. The inferior vena cava was dilated in size with <50% respiratory variability. No complete TR doppler jet so unable to estimate PA systolic pressure.   Neuro/Psych Seizures - (last seizire 12/17/18), Poorly Controlled,  negative psych ROS   GI/Hepatic negative GI ROS, (+)     substance abuse  alcohol use,   Endo/Other  negative endocrine ROS  Renal/GU negative Renal ROS  negative genitourinary   Musculoskeletal negative musculoskeletal ROS (+)   Abdominal   Peds negative pediatric ROS (+)  Hematology  (+) Blood dyscrasia, anemia ,   Anesthesia Other Findings   Reproductive/Obstetrics                           Anesthesia Physical Anesthesia Plan  ASA: III  Anesthesia Plan: General    Post-op Pain Management:    Induction: Intravenous  PONV Risk Score and Plan: 1 and Ondansetron, Dexamethasone and Midazolam  Airway Management Planned: Oral ETT  Additional Equipment:   Intra-op Plan:   Post-operative Plan: Extubation in OR  Informed Consent: I have reviewed the patients History and Physical, chart, labs and discussed the procedure including the risks, benefits and alternatives for the proposed anesthesia with the patient or authorized representative who has indicated his/her understanding and acceptance.     Dental advisory given  Plan Discussed with: CRNA  Anesthesia Plan Comments:         Anesthesia Quick Evaluation

## 2018-12-30 NOTE — Progress Notes (Signed)
Nutrition Consult/Brief Note  RD consulted for 2 gm Na diet education. Initial nutrition assessment completed 12/29/18. Pt is currently in OR for procedure. RD left "Low Sodium Nutrition Therapy" handout from the [employer] on pt's tray table. RD to continue to follow.  Maureen Chatters, RD, LDN Pager #: [phone] After-Hours Pager #: [phone]

## 2018-12-30 NOTE — Progress Notes (Addendum)
PROGRESS NOTE        PATIENT DETAILS Name: Darren Diaz Age: 33 y.o. Sex: male Date of Birth: 12/16/1985 Admit Date: 12/17/2018 Admitting Physician Hillary Bow, DO ZOX:[id number], No Pcp Per  Brief Narrative: Patient is a 33 y.o. male with history of alcohol use, seizure disorder-presenting with breakthrough seizure, fever-found to have sepsis pathophysiology secondary to Streptococcus pyogenes bacteremia and right lower extremity cellulitis.  Unfortunately, patient continued to deteriorate, and developed delirium tremens, and acute hypoxic respiratory failure-he was intubated and managed by CCM in the ICU.  Subsequently improved, was extubated on 3/13, and transferred to [facility] on 3/14.  Subjective: Continues to have pain in the right shoulder area-MRI of the right shoulder suggestive of septic arthritis/myositis.  Assessment/Plan: Sepsis secondary to Streptococcus pyogenes bacteremia with right lower extremity cellulitis and right shoulder septic arthritis: Sepsis pathophysiology has resolved, TTE was negative for endocarditis-initially on IV penicillin G-subsequently transitioned to amoxicillin with a stop date of 3/22.  Due to persistent right shoulder pain, MRI of right shoulder on 3/19 demonstrated myositis/right shoulder septic arthritis-patient kept n.p.o.-have restarted IV Rocephin-orthopedics consulted for irrigation and debridement.  ID reconsulted.  Right shoulder pain: Secondary to septic arthritis of the right shoulder.  See above.  Right upper extremity Doppler was negative for DVT on 3/15.  Acute hypoxic respiratory failure: Secondary to sepsis, worsening DTs-intubated on 3/11-and extubated on 3/13.  Currently on room air.    Acute metabolic encephalopathy: Resolved.  Secondary to alcohol withdrawal/DTs and sepsis.  CT head negative for acute abnormalities, EEG without any seizures.  Currently completely awake and alert.  .  Alcohol withdrawal with DTs:  Required transfer to ICU-intubation and mechanical ventilation-required Precedex infusion while in the ICU.  Withdrawal symptoms have resolved-currently on tapering clonidine.    Seizure disorder: Continue Keppra-EEG negative for seizures.    Transaminitis: Resolved.  Likely secondary to sepsis/shock liver, AST/ALT downtrending.  RUQ ultrasound unremarkable.   AKI: Suspect hemodynamically mediated-improving with supportive care.    Newly diagnosed chronic systolic heart failure: Volume status is stable-continue Isordil and Entresto.  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    DVT Prophylaxis: Prophylactic Lovenox   Code Status: Full code  Family Communication: None at bedside  Disposition Plan: Remain inpatient  Antimicrobial agents: Anti-infectives (From admission, onward)   Start     Dose/Rate Route Frequency Ordered Stop   12/30/18 1130  [MAR Hold]  cefTRIAXone (ROCEPHIN) 2 g in sodium chloride 0.9 % 100 mL IVPB     (MAR Hold since Fri 12/30/2018 at 1110. Reason: Transfer to a Procedural area.)   2 g 200 mL/hr over 30 Minutes Intravenous Every 24 hours 12/30/18 1010     12/28/18 1400  amoxicillin (AMOXIL) capsule 500 mg  Status:  Discontinued     500 mg Oral Every 8 hours 12/28/18 1304 12/30/18 1010   12/19/18 1700  penicillin G potassium 12 Million Units in dextrose 5 % 500 mL continuous infusion  Status:  Discontinued     12 Million Units 41.7 mL/hr over 12 Hours Intravenous Every 12 hours 12/19/18 1144 12/28/18 1304   12/19/18 0400  vancomycin (VANCOCIN) 1,500 mg in sodium chloride 0.9 % 500 mL IVPB  Status:  Discontinued     1,500 mg 250 mL/hr over 120  Minutes Intravenous Every 12 hours 12/18/18 1530 12/19/18 0901   12/18/18 1600  vancomycin (VANCOCIN) 2,000 mg in sodium chloride 0.9 % 500 mL IVPB     2,000 mg 250 mL/hr over 120 Minutes  Intravenous  Once 12/18/18 1530 12/19/18 0748   12/18/18 1530  cefTRIAXone (ROCEPHIN) 2 g in sodium chloride 0.9 % 100 mL IVPB  Status:  Discontinued     2 g 200 mL/hr over 30 Minutes Intravenous Every 24 hours 12/18/18 1519 12/19/18 1115   12/18/18 1530  metroNIDAZOLE (FLAGYL) IVPB 500 mg  Status:  Discontinued     500 mg 100 mL/hr over 60 Minutes Intravenous Every 8 hours 12/18/18 1519 12/19/18 0901     Procedures: None  CONSULTS: ID, PCCM and orthopedics  Time spent: 25 minutes  MEDICATIONS: Scheduled Meds:  [MAR Hold] carvedilol  12.5 mg Oral BID WC   chlorhexidine  60 mL Topical Once   [MAR Hold] cloNIDine  0.1 mg Oral QAC breakfast   [MAR Hold] enoxaparin (LOVENOX) injection  40 mg Subcutaneous Q24H   [MAR Hold] feeding supplement (ENSURE ENLIVE)  237 mL Oral BID BM   [MAR Hold] folic acid  1 mg Oral Daily   [MAR Hold] gabapentin  300 mg Oral BID   [MAR Hold] isosorbide dinitrate  10 mg Oral TID   [MAR Hold] levETIRAcetam  1,000 mg Oral BID   [MAR Hold] mouth rinse  15 mL Mouth Rinse BID   [MAR Hold] multivitamin with minerals  1 tablet Oral Daily   [MAR Hold] nicotine  21 mg Transdermal Daily   [MAR Hold] pantoprazole  40 mg Oral Daily   povidone-iodine  2 application Topical Once   [MAR Hold] vitamin B-6  100 mg Oral Daily   [MAR Hold] sacubitril-valsartan  1 tablet Oral BID   [MAR Hold] thiamine  100 mg Oral Daily   Continuous Infusions:  [MAR Hold] cefTRIAXone (ROCEPHIN)  IV     lactated ringers 10 mL/hr at 12/27/18 1800   lactated ringers 10 mL/hr at 12/30/18 1145   PRN Meds:.[MAR Hold] acetaminophen, [MAR Hold] bisacodyl, EPINEPHrine, [MAR Hold] Gerhardt's butt cream, [MAR Hold] haloperidol lactate, [MAR Hold] hydrALAZINE, [MAR Hold] levalbuterol, [DISCONTINUED] ondansetron **OR** [MAR Hold] ondansetron (ZOFRAN) IV, [MAR Hold] oxyCODONE, [MAR Hold] phenol, [MAR Hold] sennosides, sodium chloride (PF), sodium chloride irrigation   PHYSICAL  EXAM: Vital signs: Vitals:   12/30/18 0558 12/30/18 0825 12/30/18 1000 12/30/18 1153  BP: 137/85 133/81 (!) 142/86   Pulse: 67 79 71   Resp: 18  16   Temp: 99.3 F (37.4 C)  98.7 F (37.1 C)   TempSrc: Oral  Oral   SpO2: 98%  96%   Weight: 89.8 kg   89.8 kg  Height:     (1.803 m)   Filed Weights   12/29/18 0556 12/30/18 0558 12/30/18 1153  Weight: 89 kg 89.8 kg 89.8 kg   Body mass index is 27.61 kg/m.   General appearance:Awake, alert, not in any distress.  Eyes:no scleral icterus. HEENT: Atraumatic and Normocephalic Neck: supple, no JVD. Resp:Good air entry bilaterally,no rales or rhonchi CVS: S1 S2 regular, no murmurs.  GI: Bowel sounds present, Non tender and not distended with no gaurding, rigidity or rebound. Extremities: B/L Lower Ext shows no edema, both legs are warm to touch Neurology:  Non focal Psychiatric: Normal judgment and insight. Normal mood. Musculoskeletal:No digital cyanosis Skin:No Rash, warm and dry Wounds:N/A  I have personally reviewed following labs and imaging studies  LABORATORY DATA:  CBC: Recent Labs  Lab 12/24/18 0616 12/25/18 0803 12/26/18 0421 12/27/18 0219 12/28/18 0353  WBC 7.2 7.4 7.3 7.5   7.6 7.7  NEUTROABS  --   --   --  5.3  --   HGB 10.7* 10.7* 11.4* 10.1*   10.1* 9.9*  HCT 30.7* 31.3* 33.5* 30.6*   29.9* 29.8*  MCV 99.0 100.0 103.7* 103.4*   101.7* 102.4*  PLT 218 342 424* 490*   480* 528*    Basic Metabolic Panel: Recent Labs  Lab 12/24/18 0616 12/25/18 0428 12/26/18 0421 12/27/18 0219 12/28/18 0353  NA 137 137 139 138 137  K 3.1* 4.7 4.6 3.9 4.5  CL 107 113* 115* 110 103  CO2 19* 16* 17* 21* 26  GLUCOSE 105* 92 99 105* 95  BUN CREATININE 1.46* 1.38* 1.45* 1.48* 1.27*  CALCIUM 8.4* 8.0* 8.4* 8.4* 8.7*  MG 1.6* 2.1  --  1.7 1.9  PHOS 3.1 2.8  --  4.0  --     GFR: Estimated Creatinine Clearance: 87.3 mL/min (A) (by C-G formula based on SCr of 1.27 mg/dL (H)).  Liver Function  Tests: Recent Labs  Lab 12/25/18 0428 12/26/18 0421 12/28/18 0353  AST 38 38 34  ALT 58* 46* 37  ALKPHOS 49 47 44  BILITOT 1.5* 1.3* 0.9  PROT 6.1* 6.5 6.3*  ALBUMIN 2.3* 2.3* 2.2*   No results for input(s): LIPASE, AMYLASE in the last 168 hours. No results for input(s): AMMONIA in the last 168 hours.  Coagulation Profile: No results for input(s): INR, PROTIME in the last 168 hours.  Cardiac Enzymes: No results for input(s): CKTOTAL, CKMB, CKMBINDEX, TROPONINI in the last 168 hours.  BNP (last 3 results) No results for input(s): PROBNP in the last 8760 hours.  HbA1C: No results for input(s): HGBA1C in the last 72 hours.  CBG: Recent Labs  Lab 12/23/18 2024 12/23/18 2352 12/24/18 0457 12/24/18 1156 12/26/18 0836  GLUCAP 109* 95 83 116* 95    Lipid Profile: No results for input(s): CHOL, HDL, LDLCALC, TRIG, CHOLHDL, LDLDIRECT in the last 72 hours.  Thyroid Function Tests: No results for input(s): TSH, T4TOTAL, FREET4, T3FREE, THYROIDAB in the last 72 hours.  Anemia Panel: No results for input(s): VITAMINB12, FOLATE, FERRITIN, TIBC, IRON, RETICCTPCT in the last 72 hours.  Urine analysis:    Component Value Date/Time   COLORURINE YELLOW 12/26/2018 1022   APPEARANCEUR CLEAR 12/26/2018 1022   LABSPEC 1.011 12/26/2018 1022   PHURINE 5.0 12/26/2018 1022   GLUCOSEU NEGATIVE 12/26/2018 1022   HGBUR SMALL (A) 12/26/2018 1022   BILIRUBINUR NEGATIVE 12/26/2018 1022   KETONESUR NEGATIVE 12/26/2018 1022   PROTEINUR NEGATIVE 12/26/2018 1022   NITRITE NEGATIVE 12/26/2018 1022   LEUKOCYTESUR NEGATIVE 12/26/2018 1022    Sepsis Labs: Lactic Acid, Venous    Component Value Date/Time   LATICACIDVEN 0.8 12/21/2018 0738    MICROBIOLOGY: Recent Results (from the past 240 hour(s))  MRSA PCR Screening     Status: None   Collection Time: 12/21/18  8:38 AM  Result Value Ref Range Status   MRSA by PCR NEGATIVE NEGATIVE Final    Comment:        The GeneXpert MRSA Assay  (FDA approved for NASAL specimens only), is one component of a comprehensive MRSA colonization surveillance program. It is not intended to diagnose MRSA infection nor to guide or monitor treatment for MRSA infections. Performed at [facility], [address] [place], [place]  [address]   C difficile quick scan w PCR reflex     Status: None   Collection Time: 12/24/18  1:54 AM  Result Value Ref Range Status   C Diff antigen NEGATIVE NEGATIVE Final   C Diff toxin NEGATIVE NEGATIVE Final   C Diff interpretation No C. difficile detected.  Final    Comment: Performed at [facility], [address], [place], [place] [address]  Culture, Urine     Status: Abnormal   Collection Time: 12/26/18 10:22 AM  Result Value Ref Range Status   Specimen Description URINE, CATHETERIZED  Final   Special Requests NONE  Final   Culture (A)  Final    <10,000 COLONIES/mL INSIGNIFICANT GROWTH Performed at [facility], [address], [place], [place] [address]    Report Status 12/27/2018 FINAL  Final  Culture, blood (routine x 2)     Status: None (Preliminary result)   Collection Time: 12/26/18 10:47 AM  Result Value Ref Range Status   Specimen Description BLOOD RIGHT ANTECUBITAL  Final   Special Requests   Final    BOTTLES DRAWN AEROBIC ONLY Blood Culture adequate volume   Culture   Final    NO GROWTH 4 DAYS Performed at [facility], [address] [place], [place] [address]    Report Status PENDING  Incomplete  Culture, blood (routine x 2)     Status: None (Preliminary result)   Collection Time: 12/26/18 10:53 AM  Result Value Ref Range Status   Specimen Description BLOOD RIGHT ARM  Final   Special Requests   Final    BOTTLES DRAWN AEROBIC ONLY Blood Culture adequate volume   Culture   Final    NO GROWTH 4 DAYS Performed at [facility], [address] [place], [place] [address]    Report Status PENDING  Incomplete    RADIOLOGY STUDIES/RESULTS: Dg Chest  1 View  Result Date: 12/26/2018 CLINICAL DATA:  Fever.  Extubated. EXAM: CHEST  1 VIEW COMPARISON:  12/23/2018 FINDINGS: Endotracheal tube and nasogastric tube have been removed. Improved but mild and persistent infiltrate/atelectasis in the lower lobes left more than right. No worsening or new finding. IMPRESSION: Endotracheal tube and nasogastric tube removed. Better aeration of the lower lungs. Mild persistent atelectasis/infiltrate in the lower lobes, left more than right. Electronically Signed   By: Paulina Fusi M.D.   On: 12/26/2018 10:43   Dg Chest 2 View  Result Date: 12/17/2018 CLINICAL DATA:  Seizure, fever EXAM: CHEST - 2 VIEW COMPARISON:  09/09/2018 chest radiograph. FINDINGS: Stable cardiomediastinal silhouette with normal heart size. No pneumothorax. No pleural effusion. Lungs appear clear, with no acute consolidative airspace disease and no pulmonary edema. IMPRESSION: No active cardiopulmonary disease. Electronically Signed   By: Delbert Phenix M.D.   On: 12/17/2018 20:47   Dg Shoulder Right  Result Date: 12/20/2018 CLINICAL DATA:  Right shoulder pain. EXAM: RIGHT SHOULDER - 2+ VIEW COMPARISON:  None. FINDINGS: There is no evidence of fracture or dislocation. There is no evidence of arthropathy or other focal bone abnormality. Soft tissues are unremarkable. IMPRESSION: Negative. Electronically Signed   By: Ted Mcalpine M.D.   On: 12/20/2018 15:59   Ct Head Wo Contrast  Result Date: 12/17/2018 CLINICAL DATA:  Seizure, head trauma EXAM: CT HEAD WITHOUT CONTRAST TECHNIQUE: Contiguous axial images were obtained from the base of the skull through the vertex without intravenous contrast. COMPARISON:  MRI 09/18/2018 FINDINGS: Brain: No acute intracranial abnormality. Specifically,  no hemorrhage, hydrocephalus, mass lesion, acute infarction, or significant intracranial injury. Vascular: No hyperdense vessel or unexpected calcification. Skull: No acute calvarial abnormality. Sinuses/Orbits:  Visualized paranasal sinuses and mastoids clear. Orbital soft tissues unremarkable. Other: None IMPRESSION: No acute intracranial abnormality. Electronically Signed   By: Charlett Nose M.D.   On: 12/17/2018 20:08   US Renal  Result Date: 12/21/2018 CLINICAL DATA:  Acute kidney injury EXAM: RENAL / URINARY TRACT ULTRASOUND COMPLETE COMPARISON:  None. FINDINGS: Right Kidney: Renal measurements: 13.8 x 5.8 x 6.3 cm = volume: 268.4 mL . Echogenicity within normal limits. No mass or hydronephrosis visualized. Trace right perinephric fluid. Left Kidney: Renal measurements: 14.3 x 6.4 x 7.6 cm = volume: 371.8 mL. Echogenicity within normal limits. No mass or hydronephrosis visualized. Bladder: Appears normal for degree of bladder distention. IMPRESSION: Negative renal ultrasound Electronically Signed   By: Jasmine Pang M.D.   On: 12/21/2018 01:17   Mr Shoulder Right W Wo Contrast  Result Date: 12/29/2018 CLINICAL DATA:  Fever and sepsis. Strep pyogenes bacteremia. Right lower extremity cellulitis. Right shoulder swelling and pain. Question infection. EXAM: MRI OF THE RIGHT SHOULDER WITHOUT AND WITH CONTRAST TECHNIQUE: Multiplanar, multisequence MR imaging of the right shoulder was performed before and after the administration of intravenous contrast. CONTRAST:  10 cc Gadavist IV. COMPARISON:  None. FINDINGS: Rotator cuff: There is some thickening and heterogeneously increased T2 signal in the rotator cuff tendons consistent with tendinopathy. No tear. Muscles: Edema and mild enhancement are seen in the posterior subscapularis and infraspinatus muscle bellies. Biceps long head: Intact. There is fluid in the tendon sheath of the long head of biceps. A rim enhancing fluid collection along the medial margin of the biceps tendon distal to the bicipital groove measures up to 1.2 cm AP by 1.2 cm transverse. The collection extends below the inferior margin of the scan and may involve the biceps muscle. Acromioclavicular  Joint: Mild degenerative change is present. Type 1 acromion. Only a small amount of fluid is seen in the subacromial/subdeltoid bursa. Glenohumeral Joint: The patient has a moderately large glenohumeral joint effusion with marked synovial thickening and enhancement. Labrum:  The superior labrum is severely degenerated. Bones: There is marrow edema and enhancement in the posterior aspect of the humeral head. Subchondral edema is seen in the anterior, inferior glenoid. No fracture. Other: None. IMPRESSION: Large glenohumeral joint effusion with intense synovial enhancement and thickening highly suspicious for septic glenohumeral joint. Marrow edema in the posterior aspect of the humeral head could be related to rotator cuff tendinopathy but may also reflect osteomyelitis. Rim enhancing fluid collection along the medial margin of the biceps tendon distal to the bicipital groove extends below the inferior margin of the study and is most consistent with abscess. Edema and enhancement within the muscle belly of the posterior infraspinatus and anterior subscapularis are consistent with myositis. Electronically Signed   By: Drusilla Kanner M.D.   On: 12/29/2018 15:33   Dg Chest Port 1 View  Result Date: 12/23/2018 CLINICAL DATA:  Endotracheal tube EXAM: PORTABLE CHEST 1 VIEW COMPARISON:  Yesterday FINDINGS: Endotracheal tube tip is just below the clavicular heads, improved. Orogastric tube at least reaches the stomach. Cardiopericardial enlargement and lower lobe opacities with left-sided air bronchogram. Layering pleural fluid on the right at least. No pneumothorax is seen. IMPRESSION: 1. Improved endotracheal tube positioning. 2. Atelectasis or pneumonia at the bases with layering pleural fluid. Electronically Signed   By: Marnee Spring M.D.   On: 12/23/2018 06:43  Dg Chest Port 1 View  Result Date: 12/22/2018 CLINICAL DATA:  Shortness of breath. EXAM: PORTABLE CHEST 1 VIEW COMPARISON:  12/21/2018 FINDINGS:  Endotracheal tube 9.6 cm above the carina. Advancement with approximately 5 cm may be considered. Enteric catheter tip collimated off the image. Cardiomediastinal silhouette is enlarged. Mediastinal contours appear intact. There is no evidence of pneumothorax. Bilateral pleural effusions with lower lobe atelectasis versus airspace consolidation. Interstitial pulmonary edema. Osseous structures are without acute abnormality. Soft tissues are grossly normal. IMPRESSION: 1. Enlarged cardiac silhouette. 2. Interstitial pulmonary edema. 3. Bilateral pleural effusions with lower lobe atelectasis versus airspace consolidation. 4. Endotracheal tube may be advanced by 5 cm. Electronically Signed   By: Ted Mcalpine M.D.   On: 12/22/2018 09:01   Portable Chest X-ray  Result Date: 12/21/2018 CLINICAL DATA:  Endotracheal tube EXAM: PORTABLE CHEST 1 VIEW COMPARISON:  Earlier today FINDINGS: Endotracheal tube tip at the clavicular heads. Orogastric tube with tip reaching the stomach. Interstitial opacity with now apparent bilateral layering pleural effusions. No pneumothorax. Normal heart size. 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 IMPRESSION: 1. New hardware in unremarkable position. 2. Pulmonary edema and layering pleural effusions. Electronically Signed   By: Marnee Spring M.D.   On: 12/21/2018 09:08   Dg Chest Port 1v Same Day  Result Date: 12/21/2018 CLINICAL DATA:  Shortness of breath EXAM: PORTABLE CHEST 1 VIEW COMPARISON:  Three days ago FINDINGS: Diffuse interstitial opacity that is symmetric. Perihilar airspace opacity. Borderline heart size accentuated by technique. No effusion or pneumothorax IMPRESSION: Pulmonary edema. Electronically Signed   By: Marnee Spring M.D.   On: 12/21/2018 08:28   Dg Chest Port 1v Same Day  Result Date: 12/18/2018 CLINICAL DATA:  Shortness of breath EXAM: PORTABLE CHEST 1 VIEW COMPARISON:  December 17, 2018 FINDINGS: The heart size and mediastinal  contours are within normal limits. Both lungs are clear. The visualized skeletal structures are unremarkable. IMPRESSION: No active disease. Electronically Signed   By: Gerome Sam III M.D   On: 12/18/2018 16:01   Vas [place] Upper Extremity Venous Duplex  Result Date: 12/25/2018 UPPER VENOUS STUDY  Indications: Swelling Limitations: Patient positioning, wrist restraints. Performing Technologist: Chanda Busing RVT  Examination Guidelines: A complete evaluation includes B-mode imaging, spectral Doppler, color Doppler, and power Doppler as needed of all accessible portions of each vessel. Bilateral testing is considered an integral part of a complete examination. Limited examinations for reoccurring indications may be performed as noted.  Right Findings: +----------+------------+---------+-----------+----------+--------------+  RIGHT      Compressible Phasicity Spontaneous Properties    Summary      +----------+------------+---------+-----------+----------+--------------+  IJV            Full        Yes        Yes                                +----------+------------+---------+-----------+----------+--------------+  Subclavian     Full        Yes        Yes                                +----------+------------+---------+-----------+----------+--------------+  Axillary       Full        Yes        Yes                                +----------+------------+---------+-----------+----------+--------------+  Brachial       Full        Yes        Yes                                +----------+------------+---------+-----------+----------+--------------+  Radial         Full                                                      +----------+------------+---------+-----------+----------+--------------+  Ulnar                                                    Not visualized  +----------+------------+---------+-----------+----------+--------------+  Cephalic       Full                                                       +----------+------------+---------+-----------+----------+--------------+  Basilic        Full                                                      +----------+------------+---------+-----------+----------+--------------+  Left Findings: +----------+------------+---------+-----------+----------+-------+  LEFT       Compressible Phasicity Spontaneous Properties Summary  +----------+------------+---------+-----------+----------+-------+  Subclavian     Full        Yes        Yes                         +----------+------------+---------+-----------+----------+-------+  Summary:  Right: No evidence of deep vein thrombosis in the upper extremity. No evidence of superficial vein thrombosis in the upper extremity.  Left: No evidence of thrombosis in the subclavian.  *See table(s) above for measurements and observations.    Preliminary    [place] Abdomen Limited Ruq  Result Date: 12/19/2018 CLINICAL DATA:  Elevated LFTs EXAM: ULTRASOUND ABDOMEN LIMITED RIGHT UPPER QUADRANT COMPARISON:  None. FINDINGS: Gallbladder: Evaluation of the gallbladder is limited. There appears to be a thickened wall measuring up to 5.5 mm. No stones, pericholecystic fluid, or Murphy's sign identified. Common bile duct: Diameter: 4.0 mm Liver: No focal lesion identified. Within normal limits in parenchymal echogenicity. Portal vein is patent on color Doppler imaging with normal direction of blood flow towards the liver. IMPRESSION: 1. The study is limited due to patient condition and difficulty following instructions. The gallbladder is not well assessed as it is contracted. The wall thickening to 5.5 mm could be due to lack of distention. No stones, pericholecystic fluid, or Murphy's sign identified. If there is concern for acute cholecystitis, recommend a HIDA scan. 2. No other abnormalities. Electronically Signed   By: Gerome Samavid  Williams III M.D   On: 12/19/2018 13:54     LOS: 13 days   Jeoffrey MassedShanker Tanayah Squitieri, MD  [facility]  If 7PM-7AM,  please contact night-coverage  Please page via [email or web address]  Go to [email or web address] and use [facility]'s universal password to access. If you do not have the password, please contact the hospital operator.  Locate the [facility] provider you are looking for under [facility] and page to a number that you can be directly reached. If you still have difficulty reaching the provider, please page the [facility] (Director on Call) for the Hospitalists listed on amion for assistance.  12/30/2018, 12:58 PM

## 2018-12-30 NOTE — Progress Notes (Signed)
CSW was requested by patient's father. He requested information on where patient could go after discharge to help keep him compliant with medications. CSW provided list of [employer] and group homes as well as substance use resources and mental health follow up.   Osborne Casco English Tomer LCSW [phone]

## 2018-12-31 LAB — CULTURE, BLOOD (ROUTINE X 2)
CULTURE: NO GROWTH
Culture: NO GROWTH
Special Requests: ADEQUATE
Special Requests: ADEQUATE

## 2018-12-31 LAB — BASIC METABOLIC PANEL
Anion gap: 13 (ref 5–15)
BUN: 12 mg/dL (ref 6–20)
CO2: 22 mmol/L (ref 22–32)
Calcium: 9.4 mg/dL (ref 8.9–10.3)
Chloride: 99 mmol/L (ref 98–111)
Creatinine, Ser: 1.1 mg/dL (ref 0.61–1.24)
GFR calc Af Amer: >60 mL/min (ref >60)
GFR calc non Af Amer: >60 mL/min (ref >60)
GLUCOSE: 107 mg/dL — AB (ref 70–99)
Potassium: 3.8 mmol/L (ref 3.5–5.1)
Sodium: 134 mmol/L — ABNORMAL LOW (ref 135–145)

## 2018-12-31 LAB — CBC
HCT: 28.2 % — ABNORMAL LOW (ref 39.0–52.0)
Hemoglobin: 9.7 g/dL — ABNORMAL LOW (ref 13.0–17.0)
MCH: 35 pg — ABNORMAL HIGH (ref 26.0–34.0)
MCHC: 34.4 g/dL (ref 30.0–36.0)
MCV: 101.8 fL — ABNORMAL HIGH (ref 80.0–100.0)
Platelets: 660 10*3/uL — ABNORMAL HIGH (ref 150–400)
RBC: 2.77 MIL/uL — ABNORMAL LOW (ref 4.22–5.81)
RDW: 11.8 % (ref 11.5–15.5)
WBC: 10.6 10*3/uL — ABNORMAL HIGH (ref 4.0–10.5)
nRBC: 0 % (ref 0.0–0.2)

## 2018-12-31 MED ORDER — POLYETHYLENE GLYCOL 3350 17 G PO PACK
17.0000 g | PACK | Freq: Every day | ORAL | Status: DC
  Administered 2018-12-31: 17 g via ORAL
  Filled 2018-12-31 (×3): qty 1

## 2018-12-31 NOTE — Progress Notes (Addendum)
Spoke with patient about placement of PICC, who was unaware of the order. Patient to speak with Dr. Jerral Ralph concerning need for PICC. Dr. Jerral Ralph notified and will speak with patient concerning need. Will place, if agreeable, prior to discharge.

## 2018-12-31 NOTE — Evaluation (Signed)
Occupational Therapy Evaluation Patient Details Name: Darren Diaz MRN: [id number] DOB: [birth year] Today's Date: 12/31/2018    History of Present Illness Patient male who presented with seizure, ETOH withdrawal, sepsis, bacteremia and newly diagnosed systolic heart failure. Intubated 3/11-3/13. Pt on 12/30/18 had a right shoulder open excision of the biceps tendon sheath phlegmon below and around the pectoralis major insertion with arthroscopic I and D and extensive debridement with placement of Stimulan beads. PMH includes ETOH use, PDA with closure at age 33.    Clinical Impression   Pt admitted with see above. Pt currently with functional limitations due to the deficits listed below (see OT Problem List). Pt today was guarding R shoulder with ADLs and completion of RUE HEP. Pt was educated about HEP to decrease risk of frozen shoulder. Pt was educated on completion of UE dressing at this time. Pt plans to go home with the father able to support 24/7 with the return to home. Pt will benefit from skilled OT to increase their safety and independence with ADL, functional mobility for ADL and completion of HEP to facilitate discharge to venue listed below.       Follow Up Recommendations  Home health OT;Supervision/Assistance - 24 hour    Equipment Recommendations  Tub/shower seat    Recommendations for Other Services       Precautions / Restrictions Precautions Precautions: Fall Precaution Comments: pendulums, AROM/PROM of RUE  Restrictions Weight Bearing Restrictions: No      Mobility Bed Mobility               General bed mobility comments: presented seated  Transfers Overall transfer level: Needs assistance   Transfers: Sit to/from Stand Sit to Stand: Min guard         General transfer comment: cues with pacing as impulsive     Balance Overall balance assessment: Needs assistance Sitting-balance support: Feet supported Sitting balance-Leahy Scale: Good                                      ADL either performed or assessed with clinical judgement   ADL Overall ADL's : Needs assistance/impaired Eating/Feeding: Independent;Sitting   Grooming: Wash/dry hands;Supervision/safety;Cueing for safety;Cueing for sequencing;Sitting;Standing   Upper Body Bathing: Minimal assistance;Cueing for sequencing;Cueing for compensatory techniques;Sitting;Standing   Lower Body Bathing: Minimal assistance;Cueing for safety;Cueing for sequencing;Sit to/from stand   Upper Body Dressing : Minimal assistance;Sitting;Standing;Cueing for safety;Cueing for sequencing   Lower Body Dressing: Minimal assistance;Cueing for safety;Cueing for sequencing   Toilet Transfer: Supervision/safety;Cueing for safety;Cueing for sequencing;Comfort height toilet   Toileting- Clothing Manipulation and Hygiene: Min guard;Cueing for safety;Cueing for sequencing;Cueing for compensatory techniques;Sit to/from stand   Tub/ Engineer, structural: Minimal assistance;Grab bars   Functional mobility during ADLs: Min guard;Cueing for safety;Cueing for sequencing       Vision Baseline Vision/History: No visual deficits Patient Visual Report: No change from baseline Vision Assessment?: No apparent visual deficits     Perception Perception Perception Tested?: No   Praxis Praxis Praxis tested?: Not tested    Pertinent Vitals/Pain Pain Assessment: 0-10 Pain Score: 8  Pain Location: R shoulder Pain Descriptors / Indicators: Aching Pain Intervention(s): Limited activity within patient's tolerance;Monitored during session;Repositioned     Hand Dominance Right   Extremity/Trunk Assessment Upper Extremity Assessment Upper Extremity Assessment: RUE deficits/detail RUE Deficits / Details: limited post sx RUE: Unable to fully assess due to pain RUE Sensation: WNL RUE Coordination:  decreased gross motor   Lower Extremity Assessment Lower Extremity Assessment: Defer to PT  evaluation   Cervical / Trunk Assessment Cervical / Trunk Assessment: Normal   Communication Communication Communication: No difficulties   Cognition Arousal/Alertness: Awake/alert Behavior During Therapy: Restless;Impulsive   Area of Impairment: Safety/judgement                         Safety/Judgement: Decreased awareness of deficits     General Comments: impulsive and cues to pace self   General Comments       Exercises Exercises: Hand activities;General Upper Extremity General Exercises - Upper Extremity Shoulder Flexion: Right;PROM;5 reps Elbow Flexion: Right;AROM;5 reps Wrist Flexion: Right;AROM;5 reps Wrist Extension: Right;AROM;5 reps Shoulder Exercises Pendulum Exercise: Right;Seated   Shoulder Instructions      Home Living Family/patient expects to be discharged to:: Private residence Living Arrangements: Alone Available Help at Discharge: Family;Available 24 hours/day Type of Home: House Home Access: Stairs to enter [employer] of Steps: 3 Entrance Stairs-Rails: Can reach both;Right;Left Home Layout: Two level Alternate Level Stairs-Number of Steps: 14 Alternate Level Stairs-Rails: Left Bathroom Shower/Tub: Tub/shower unit;Walk-in shower   Bathroom Toilet: Standard Bathroom Accessibility: Yes How Accessible: Accessible via walker Home Equipment: None          Prior Functioning/Environment Level of Independence: Independent                 OT Problem List: Decreased strength;Decreased range of motion;Decreased activity tolerance;Decreased safety awareness;Decreased knowledge of precautions;Pain      OT Treatment/Interventions: Self-care/ADL training;Therapeutic exercise;DME and/or AE instruction;Therapeutic activities;Patient/family education;Balance training    OT Goals(Current goals can be found in the care plan section) Acute Rehab OT Goals Patient Stated Goal: to reduce pain OT Goal Formulation: With  patient Time For Goal Achievement: 01/07/19 Potential to Achieve Goals: Good ADL Goals Pt Will Perform Upper Body Bathing: with modified independence;sitting Pt Will Perform Tub/Shower Transfer: with modified independence;ambulating Pt/caregiver will Perform Home Exercise Program: Right Upper extremity;With written HEP provided  OT Frequency: Min 2X/week   Barriers to D/C:            Co-evaluation              AM-PAC OT "6 Clicks" Daily Activity     Outcome Measure Help from another person eating meals?: None Help from another person taking care of personal grooming?: A Little Help from another person toileting, which includes using toliet, bedpan, or urinal?: A Little Help from another person bathing (including washing, rinsing, drying)?: A Lot Help from another person to put on and taking off regular upper body clothing?: A Little Help from another person to put on and taking off regular lower body clothing?: A Little 6 Click Score: 18   End of Session Equipment Utilized During Treatment: Gait belt  Activity Tolerance: Patient tolerated treatment well;Patient limited by fatigue;Patient limited by pain Patient left: in chair;with call bell/phone within reach;with family/visitor present;with chair alarm set  OT Visit Diagnosis: Unsteadiness on feet (R26.81);Repeated falls (R29.6);History of falling (Z91.81);Pain Pain - Right/Left: Right Pain - part of body: Shoulder                Time: 1132-1226 OT Time Calculation (min): 54 min Charges:  OT General Charges $OT Visit: 1 Visit OT Evaluation $OT Eval Low Complexity: 1 Low OT Treatments $Self Care/Home Management : 38-52 mins  Alphia Moh OTR/L  Acute Rehab Services  [phone] office number [phone] pager number   Morrell Riddle  Menachem Urbanek 12/31/2018, 12:50 PM

## 2018-12-31 NOTE — Progress Notes (Signed)
Physical Therapy Treatment Patient Details Name: Darren Diaz MRN: [id number] DOB: 01/28/1986 Today's Date: 12/31/2018    History of Present Illness Patient male who presents with seizure x8 mins secondary to ETOH withdrawal, sepsis, bacteremia and newly diagnosed systolic heart failure. Intubated 3/11-3/13. Pt on 12/30/18 had a right shoulder open excision of the biceps tendon sheath phlegmon below and around the pectoralis major insertion with arthroscopic I and D and extensive debridement with placement of Stimulan beads. PMH includes ETOH use, PDA with closure at age 33.     PT Comments    Patient progressing with mobility, balance, though still with balance and coordination deficits and at risk for falls with dynamic gait deficits.  Feel he would be good candidate for outpatient PT, but not currently available so recommend HHPT.  PT to follow acutely, goals updated due to progress.   Follow Up Recommendations  Home health PT     Equipment Recommendations  None recommended by PT    Recommendations for Other Services       Precautions / Restrictions Precautions Precautions: Fall Precaution Comments: pendulums, AROM/PROM of RUE  Restrictions Weight Bearing Restrictions: No    Mobility  Bed Mobility               General bed mobility comments: up in recliner  Transfers Overall transfer level: Needs assistance Equipment used: None Transfers: Sit to/from Stand Sit to Stand: Supervision         General transfer comment: for safety with IV and due to balance/coordination issues  Ambulation/Gait Ambulation/Gait assistance: Min guard;Min assist Gait Distance (Feet): 220 Feet Assistive device: None Gait Pattern/deviations: Step-through pattern;Decreased stride length;Ataxic;Staggering left;Staggering right;Drifts right/left     General Gait Details: at times veering to one side, LOB on turn (180) min A to recover   Stairs             Wheelchair Mobility    Modified Rankin (Stroke Patients Only)       Balance Overall balance assessment: Needs assistance Sitting-balance support: Feet supported Sitting balance-Leahy Scale: Good     Standing balance support: No upper extremity supported Standing balance-Leahy Scale: Fair Standing balance comment: static standing without support, assist for ambulation and dynamic activities                            Cognition Arousal/Alertness: Awake/alert Behavior During Therapy: Restless;Impulsive Overall Cognitive Status: Impaired/Different from baseline Area of Impairment: Safety/judgement                       Following Commands: Follows multi-step commands inconsistently Safety/Judgement: Decreased awareness of deficits     General Comments: dad in the room, patient eager to walk in room on his own to work on balance/coordination issues when we discussed them; educated pt not to get up on his own due to fall risk, but okay with family assist.       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     General Comments        Pertinent Vitals/Pain Pain Assessment: 0-10 Pain Score: 7  Pain Location: R shoulder Pain Descriptors / Indicators: Aching Pain Intervention(s): Repositioned;Ice applied    Home Living  Family/patient expects to be discharged to:: Private residence Living Arrangements: Alone Available Help at Discharge: Family;Available 24 hours/day Type of Home: House Home Access: Stairs to enter Entrance Stairs-Rails: Can reach both;Right;Left Home Layout: Two level Home Equipment: None      Prior Function Level of  Independence: Independent          PT Goals (current goals can now be found in the care plan section) Acute Rehab PT Goals Patient Stated Goal: to reduce pain Progress towards PT goals: Progressing toward goals    Frequency    Min 3X/week      PT Plan Discharge plan needs to be updated    Co-evaluation              AM-PAC PT "6 Clicks" Mobility   Outcome Measure  Help needed turning from your back to your side while in a flat bed without using bedrails?: None Help needed moving from lying on your back to sitting on the side of a flat bed without using bedrails?: None Help needed moving to and from a bed to a chair (including a wheelchair)?: A Little Help needed standing up from a chair using your arms (e.g., wheelchair or bedside chair)?: A Little Help needed to walk in hospital room?: A Little Help needed climbing 3-5 steps with a railing? : A Little 6 Click Score: 20    End of Session Equipment Utilized During Treatment: Gait belt Activity Tolerance: Patient tolerated treatment well Patient left: in chair;with chair alarm set;with family/visitor present   PT Visit Diagnosis: Other abnormalities of gait and mobility (R26.89);Other symptoms and signs involving the nervous system (R29.898);Pain Pain - Right/Left: Right Pain - part of body: Shoulder     Time: 0321-2248 PT Time Calculation (min) (ACUTE ONLY): 23 min  Charges:  $Gait Training: 8-22 mins $Neuromuscular Re-education: 8-22 mins                     Sheran Lawless, [place] [facility] [phone] 12/31/2018    Darren Diaz 12/31/2018, 2:47 PM

## 2018-12-31 NOTE — Progress Notes (Signed)
Progress Note  Patient Name: Darren Diaz Date of Encounter: 12/31/2018  Primary Cardiologist: Nanetta Batty, MD   Subjective   Well today other than right shoulder pain.  No chest pain or shortness of breath.  Inpatient Medications    Scheduled Meds:  carvedilol  12.5 mg Oral BID WC   cloNIDine  0.1 mg Oral QAC breakfast   docusate sodium  100 mg Oral BID   enoxaparin (LOVENOX) injection  40 mg Subcutaneous Q24H   feeding supplement (ENSURE ENLIVE)  237 mL Oral BID BM   folic acid  1 mg Oral Daily   gabapentin  300 mg Oral TID   isosorbide dinitrate  10 mg Oral TID   levETIRAcetam  1,000 mg Oral BID   mouth rinse  15 mL Mouth Rinse BID   multivitamin with minerals  1 tablet Oral Daily   nicotine  21 mg Transdermal Daily   pantoprazole  40 mg Oral Daily   vitamin B-6  100 mg Oral Daily   sacubitril-valsartan  1 tablet Oral BID   thiamine  100 mg Oral Daily   traMADol  50 mg Oral Q6H   Continuous Infusions:  cefTRIAXone (ROCEPHIN)  IV 2 g (12/30/18 1634)   lactated ringers 10 mL/hr at 12/27/18 1800   lactated ringers Stopped (12/31/18 1042)   methocarbamol (ROBAXIN) IV     PRN Meds: acetaminophen, bisacodyl, Gerhardt's butt cream, haloperidol lactate, hydrALAZINE, HYDROcodone-acetaminophen, levalbuterol, methocarbamol **OR** methocarbamol (ROBAXIN) IV, metoCLOPramide **OR** metoCLOPramide (REGLAN) injection, morphine injection, ondansetron **OR** ondansetron (ZOFRAN) IV, oxyCODONE, phenol, sennosides   Vital Signs    Vitals:   12/30/18 2000 12/30/18 2107 12/31/18 0432 12/31/18 0842  BP:  131/77 140/77 (!) 154/87  Pulse: (!) 0 80 89 83  Resp:  18 18   Temp:  99.2 F (37.3 C) 98.8 F (37.1 C)   TempSrc:  Oral Oral   SpO2:  96% 96%   Weight:      Height:        Intake/Output Summary (Last 24 hours) at 12/31/2018 1111 Last data filed at 12/31/2018 7915 Gross per 24 hour  Intake 1836.61 ml  Output 1925 ml  Net -88.39 ml   Last 3 Weights  12/30/2018 12/30/2018 12/29/2018  Weight (lbs) 197 lb 15.6 oz 197 lb 15.6 oz 196 lb 3.4 oz  Weight (kg) 89.8 kg 89.8 kg 89 kg      Telemetry    Sinus rhythm- Personally Reviewed  Physical Exam   GEN: Well nourished, well developed, in no acute distress  HEENT: normal  Neck: no JVD, carotid bruits, or masses Cardiac: RRR; no murmurs, rubs, or gallops,no edema  Respiratory:  clear to auscultation bilaterally, normal work of breathing GI: soft, nontender, nondistended, + BS MS: no deformity or atrophy  Skin: warm and dry Neuro:  Strength and sensation are intact Psych: euthymic mood, full affect   Labs    Chemistry Recent Labs  Lab 12/25/18 0428 12/26/18 0421 12/27/18 0219 12/28/18 0353 12/30/18 2158 12/31/18 0439  NA 137 139 138 137  --  134*  K 4.7 4.6 3.9 4.5  --  3.8  CL 113* 115* 110 103  --  99  CO2 16* 17* 21* 26  --  22  GLUCOSE 92 99 105* 95  --  107*  BUN 17 16 13 13   --  12  CREATININE 1.38* 1.45* 1.48* 1.27* 1.13 1.10  CALCIUM 8.0* 8.4* 8.4* 8.7*  --  9.4  PROT 6.1* 6.5  --  6.3*  --   --   ALBUMIN 2.3* 2.3*  --  2.2*  --   --   AST 38 38  --  34  --   --   ALT 58* 46*  --  37  --   --   ALKPHOS 49 47  --  44  --   --   BILITOT 1.5* 1.3*  --  0.9  --   --   GFRNONAA >60 >60 >60 >60 >60 >60  GFRAA >60 >60 >60 >60 >60 >60  ANIONGAP 8 7 7 8   --  13     Hematology Recent Labs  Lab 12/28/18 0353 12/30/18 2158 12/31/18 0439  WBC 7.7 7.8 10.6*  RBC 2.91* 2.97* 2.77*  HGB 9.9* 10.3* 9.7*  HCT 29.8* 29.8* 28.2*  MCV 102.4* 100.3* 101.8*  MCH 34.0 34.7* 35.0*  MCHC 33.2 34.6 34.4  RDW 12.0 11.8 11.8  PLT 528* 446* 660*    Radiology    Mr Shoulder Right W Wo Contrast  Result Date: 12/29/2018 CLINICAL DATA:  Fever and sepsis. Strep pyogenes bacteremia. Right lower extremity cellulitis. Right shoulder swelling and pain. Question infection. EXAM: MRI OF THE RIGHT SHOULDER WITHOUT AND WITH CONTRAST TECHNIQUE: Multiplanar, multisequence MR imaging of  the right shoulder was performed before and after the administration of intravenous contrast. CONTRAST:  10 cc Gadavist IV. COMPARISON:  None. FINDINGS: Rotator cuff: There is some thickening and heterogeneously increased T2 signal in the rotator cuff tendons consistent with tendinopathy. No tear. Muscles: Edema and mild enhancement are seen in the posterior subscapularis and infraspinatus muscle bellies. Biceps long head: Intact. There is fluid in the tendon sheath of the long head of biceps. A rim enhancing fluid collection along the medial margin of the biceps tendon distal to the bicipital groove measures up to 1.2 cm AP by 1.2 cm transverse. The collection extends below the inferior margin of the scan and may involve the biceps muscle. Acromioclavicular Joint: Mild degenerative change is present. Type 1 acromion. Only a small amount of fluid is seen in the subacromial/subdeltoid bursa. Glenohumeral Joint: The patient has a moderately large glenohumeral joint effusion with marked synovial thickening and enhancement. Labrum:  The superior labrum is severely degenerated. Bones: There is marrow edema and enhancement in the posterior aspect of the humeral head. Subchondral edema is seen in the anterior, inferior glenoid. No fracture. Other: None. IMPRESSION: Large glenohumeral joint effusion with intense synovial enhancement and thickening highly suspicious for septic glenohumeral joint. Marrow edema in the posterior aspect of the humeral head could be related to rotator cuff tendinopathy but may also reflect osteomyelitis. Rim enhancing fluid collection along the medial margin of the biceps tendon distal to the bicipital groove extends below the inferior margin of the study and is most consistent with abscess. Edema and enhancement within the muscle belly of the posterior infraspinatus and anterior subscapularis are consistent with myositis. Electronically Signed   By: Drusilla Kanner M.D.   On: 12/29/2018 15:33     [place] Ekg Site Rite  Result Date: 12/30/2018 If Site Rite image not attached, placement could not be confirmed due to current cardiac rhythm.   Cardiac Studies   Echo: 1. The left ventricle has a visually estimated ejection fraction of of 35%. The cavity size was mildly dilated. Left ventricular diastolic Doppler parameters are consistent with impaired relaxation. Left ventricular diffuse hypokinesis.  2. The right ventricle has normal systolic function. The cavity was normal. There is no increase in  right ventricular wall thickness.  3. No evidence of mitral valve stenosis. Trivial mitral regurgitation.  4. The aortic valve is tricuspid no stenosis of the aortic valve.  5. The aortic root and ascending aorta are normal in size and structure.  6. The inferior vena cava was dilated in size with <50% respiratory variability. No complete TR doppler jet so unable to estimate PA systolic pressure.  FINDINGS  Left Ventricle: The left ventricle has a visually estimated ejection fraction of of 35%. The cavity size was mildly dilated. There is no increase in left ventricular wall thickness. Left ventricular diastolic Doppler parameters are consistent with  impaired relaxation. Left ventricular diffuse hypokinesis. Right Ventricle: The right ventricle has normal systolic function. The cavity was normal. There is no increase in right ventricular wall thickness. Left Atrium: left atrial size was normal in size Right Atrium: right atrial size was normal in size. Right atrial pressure is estimated at 15 mmHg. Interatrial Septum: No atrial level shunt detected by color flow Doppler. Pericardium: There is no evidence of pericardial effusion. Mitral Valve: The mitral valve is normal in structure. Mitral valve regurgitation is trivial by color flow Doppler. No evidence of mitral valve stenosis. Tricuspid Valve: The tricuspid valve is normal in structure. Tricuspid valve regurgitation is trivial by color flow  Doppler. Aortic Valve: The aortic valve is tricuspid Aortic valve regurgitation was not visualized by color flow Doppler. There is no stenosis of the aortic valve. Pulmonic Valve: The pulmonic valve was normal in structure. Pulmonic valve regurgitation is not visualized by color flow Doppler. Aorta: The aortic root and ascending aorta are normal in size and structure. Venous: The inferior vena cava is dilated in size with less than 50% respiratory variability.    Patient Profile     33 y.o. male with a hx of PDA repair (age 50 at [facility]), seizure disorder due heavy alcohol drinking and tobacco smokingwho is being seen for the evaluation of Low EFat the request of Dr. Jerral Ralph.  Assessment & Plan    1. Acute systolic CHF: EF 83% Secondary to alcohol abuse per his father.  Father is looking in ways of getting him to a stable outpatient environment.  Is tolerating heart failure medications.  Blood pressure is elevated today.  We Vera Furniss thus increase carvedilol.  Would continue his other heart failure medications at their current doses.  May be able to increase Entresto as an outpatient and add Aldactone.  2. CKD II:  Admit per medicine.  Creatinine improving  3. HTN Blood pressures generally greater than 130.  Currently on Entresto and Coreg.  [place] Severs increase Coreg today.  Blood pressure elevation could be due to pain from recent shoulder surgery.  [facility] Nickisha Hum sign off.   Medication Recommendations:  Coreg, entresto. If BP remaions elevated can increase entresto to maximum dose Other recommendations (labs, testing, etc):  none Follow up as an outpatient:  To be arranged with general cardiology  For questions or updates, please contact [facility] Please consult [email or web address] for contact info under       Signed, Brenda Samano Jorja Loa, MD  12/31/2018, 11:11 AM

## 2018-12-31 NOTE — Progress Notes (Signed)
PROGRESS NOTE        PATIENT DETAILS Name: Darren Diaz Age: 33 y.o. Sex: male Date of Birth: 08-25-1986 Admit Date: 12/17/2018 Admitting Physician Hillary Bow, DO OFB:PZWCHEN, No Pcp Per  Brief Narrative: Patient is a 33 y.o. male with history of alcohol use, seizure disorder-presenting with breakthrough seizure, fever-found to have sepsis pathophysiology secondary to Streptococcus pyogenes bacteremia and right lower extremity cellulitis.  Unfortunately, patient continued to deteriorate, and developed delirium tremens, and acute hypoxic respiratory failure-he was intubated and managed by CCM in the ICU.  Subsequently improved, was extubated on 3/13, and transferred to [facility] on 3/14.  Subjective: Complains of some pain in the shoulder area but denies any chest pain or shortness of breath.  Denies any cough.  Assessment/Plan: Sepsis secondary to Streptococcus pyogenes bacteremia with right lower extremity cellulitis and right shoulder septic arthritis: Sepsis pathophysiology has resolved, TTE negative for endocarditis.  Initially on pen G-transition to oral amoxicillin-but on 3/19 underwent MRI of the right shoulder due to worsening pain and was found to have septic arthritis.  Underwent I&D on 3/20.  Now on IV Rocephin-we will place PICC line.  Probably will require at least 6 weeks of IV antimicrobial therapy-we will await further recommendations from infectious disease next week.  Right shoulder pain: Secondary to septic arthritis of the right shoulder.  See above.  Right upper extremity Doppler was negative for DVT on 3/15.  Acute hypoxic respiratory failure: Secondary to sepsis, worsening DTs-intubated on 3/11-and extubated on 3/13.  Currently on room air.    Acute metabolic encephalopathy: Resolved.  Secondary to alcohol withdrawal/DTs and sepsis.  CT head negative for acute abnormalities, EEG without any seizures.  Currently completely awake and alert.     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  Continue Coreg, Entresto and Imdur.  Alcohol withdrawal with DTs: Required transfer to ICU-intubation and mechanical ventilation-required Precedex infusion while in the ICU.  Withdrawal symptoms have resolved-currently on tapering clonidine.     Seizure disorder: Continue Keppra-EEG negative for seizures.    Transaminitis: Resolved.  Likely secondary to sepsis/shock liver, AST/ALT downtrending.  RUQ ultrasound unremarkable.   AKI: Suspect hemodynamically mediated-improving with supportive care.    DVT Prophylaxis: Prophylactic Lovenox   Code Status: Full code  Family Communication: None at bedside  Disposition Plan: Remain inpatient-Home with home health services sometime next week  Antimicrobial agents: Anti-infectives (From admission, onward)   Start     Dose/Rate Route Frequency Ordered Stop   12/30/18 1130  cefTRIAXone (ROCEPHIN) 2 g in sodium chloride 0.9 % 100 mL IVPB     2 g 200 mL/hr over 30 Minutes Intravenous Every 24 hours 12/30/18 1010     12/28/18 1400  amoxicillin (AMOXIL) capsule 500 mg  Status:  Discontinued     500 mg Oral Every 8 hours 12/28/18 1304 12/30/18 1010   12/19/18 1700  penicillin G potassium 12 Million Units in dextrose 5 % 500 mL continuous infusion  Status:  Discontinued     12 Million Units 41.7 mL/hr over 12 Hours Intravenous Every 12 hours 12/19/18 1144 12/28/18 1304   12/19/18 0400  vancomycin (VANCOCIN) 1,500 mg in sodium chloride 0.9 % 500 mL IVPB  Status:  Discontinued     1,500 mg 250 mL/hr over 120 Minutes Intravenous  Every 12 hours 12/18/18 1530 12/19/18 0901   12/18/18 1600  vancomycin (VANCOCIN) 2,000 mg in sodium chloride 0.9 % 500 mL IVPB     2,000 mg 250 mL/hr over 120 Minutes Intravenous  Once 12/18/18 1530 12/19/18 0748   12/18/18 1530  cefTRIAXone (ROCEPHIN) 2 g in sodium chloride 0.9 % 100  mL IVPB  Status:  Discontinued     2 g 200 mL/hr over 30 Minutes Intravenous Every 24 hours 12/18/18 1519 12/19/18 1115   12/18/18 1530  metroNIDAZOLE (FLAGYL) IVPB 500 mg  Status:  Discontinued     500 mg 100 mL/hr over 60 Minutes Intravenous Every 8 hours 12/18/18 1519 12/19/18 0901     Procedures: None  CONSULTS: ID, PCCM and orthopedics  Time spent: 25 minutes  MEDICATIONS: Scheduled Meds:  carvedilol  12.5 mg Oral BID WC   cloNIDine  0.1 mg Oral QAC breakfast   docusate sodium  100 mg Oral BID   enoxaparin (LOVENOX) injection  40 mg Subcutaneous Q24H   feeding supplement (ENSURE ENLIVE)  237 mL Oral BID BM   folic acid  1 mg Oral Daily   gabapentin  300 mg Oral TID   isosorbide dinitrate  10 mg Oral TID   levETIRAcetam  1,000 mg Oral BID   mouth rinse  15 mL Mouth Rinse BID   multivitamin with minerals  1 tablet Oral Daily   nicotine  21 mg Transdermal Daily   pantoprazole  40 mg Oral Daily   vitamin B-6  100 mg Oral Daily   sacubitril-valsartan  1 tablet Oral BID   thiamine  100 mg Oral Daily   traMADol  50 mg Oral Q6H   Continuous Infusions:  cefTRIAXone (ROCEPHIN)  IV 2 g (12/31/18 1220)   lactated ringers 10 mL/hr at 12/27/18 1800   lactated ringers Stopped (12/31/18 1042)   methocarbamol (ROBAXIN) IV     PRN Meds:.acetaminophen, bisacodyl, Gerhardt's butt cream, haloperidol lactate, hydrALAZINE, HYDROcodone-acetaminophen, levalbuterol, methocarbamol **OR** methocarbamol (ROBAXIN) IV, metoCLOPramide **OR** metoCLOPramide (REGLAN) injection, morphine injection, ondansetron **OR** ondansetron (ZOFRAN) IV, oxyCODONE, phenol, sennosides   PHYSICAL EXAM: Vital signs: Vitals:   12/30/18 2000 12/30/18 2107 12/31/18 0432 12/31/18 0842  BP:  131/77 140/77 (!) 154/87  Pulse: (!) 0 80 89 83  Resp:  18 18   Temp:  99.2 F (37.3 C) 98.8 F (37.1 C)   TempSrc:  Oral Oral   SpO2:  96% 96%   Weight:      Height:       Filed Weights    12/29/18 0556 12/30/18 0558 12/30/18 1153  Weight: 89 kg 89.8 kg 89.8 kg   Body mass index is 27.61 kg/m.   General appearance:Awake, alert, not in any distress.  Eyes:no scleral icterus. HEENT: Atraumatic and Normocephalic Neck: supple, no JVD. Resp:Good air entry bilaterally,no rales or rhonchi CVS: S1 S2 regular, no murmurs.  GI: Bowel sounds present, Non tender and not distended with no gaurding, rigidity or rebound. Extremities: B/L Lower Ext shows no edema, both legs are warm to touch Neurology:  Non focal Psychiatric: Normal judgment and insight. Normal mood. Musculoskeletal:No digital cyanosis Skin:No Rash, warm and dry Wounds:N/A  I have personally reviewed following labs and imaging studies  LABORATORY DATA: CBC: Recent Labs  Lab 12/26/18 0421 12/27/18 0219 12/28/18 0353 12/30/18 2158 12/31/18 0439  WBC 7.3 7.5   7.6 7.7 7.8 10.6*  NEUTROABS  --  5.3  --   --   --   HGB 11.4* 10.1*  10.1* 9.9* 10.3* 9.7*  HCT 33.5* 30.6*   29.9* 29.8* 29.8* 28.2*  MCV 103.7* 103.4*   101.7* 102.4* 100.3* 101.8*  PLT 424* 490*   480* 528* 446* 660*    Basic Metabolic Panel: Recent Labs  Lab 12/25/18 0428 12/26/18 0421 12/27/18 0219 12/28/18 0353 12/30/18 2158 12/31/18 0439  NA 137 139 138 137  --  134*  K 4.7 4.6 3.9 4.5  --  3.8  CL 113* 115* 110 103  --  99  CO2 16* 17* 21* 26  --  22  GLUCOSE 92 99 105* 95  --  107*  BUN 17 16 13 13   --  12  CREATININE 1.38* 1.45* 1.48* 1.27* 1.13 1.10  CALCIUM 8.0* 8.4* 8.4* 8.7*  --  9.4  MG 2.1  --  1.7 1.9  --   --   PHOS 2.8  --  4.0  --   --   --     GFR: Estimated Creatinine Clearance: 100.8 mL/min (by C-G formula based on SCr of 1.1 mg/dL).  Liver Function Tests: Recent Labs  Lab 12/25/18 0428 12/26/18 0421 12/28/18 0353  AST 38 38 34  ALT 58* 46* 37  ALKPHOS 49 47 44  BILITOT 1.5* 1.3* 0.9  PROT 6.1* 6.5 6.3*  ALBUMIN 2.3* 2.3* 2.2*   No results for input(s): LIPASE, AMYLASE in the last 168 hours. No  results for input(s): AMMONIA in the last 168 hours.  Coagulation Profile: No results for input(s): INR, PROTIME in the last 168 hours.  Cardiac Enzymes: No results for input(s): CKTOTAL, CKMB, CKMBINDEX, TROPONINI in the last 168 hours.  BNP (last 3 results) No results for input(s): PROBNP in the last 8760 hours.  HbA1C: No results for input(s): HGBA1C in the last 72 hours.  CBG: Recent Labs  Lab 12/26/18 0836  GLUCAP 95    Lipid Profile: No results for input(s): CHOL, HDL, LDLCALC, TRIG, CHOLHDL, LDLDIRECT in the last 72 hours.  Thyroid Function Tests: No results for input(s): TSH, T4TOTAL, FREET4, T3FREE, THYROIDAB in the last 72 hours.  Anemia Panel: No results for input(s): VITAMINB12, FOLATE, FERRITIN, TIBC, IRON, RETICCTPCT in the last 72 hours.  Urine analysis:    Component Value Date/Time   COLORURINE YELLOW 12/26/2018 1022   APPEARANCEUR CLEAR 12/26/2018 1022   LABSPEC 1.011 12/26/2018 1022   PHURINE 5.0 12/26/2018 1022   GLUCOSEU NEGATIVE 12/26/2018 1022   HGBUR SMALL (A) 12/26/2018 1022   BILIRUBINUR NEGATIVE 12/26/2018 1022   KETONESUR NEGATIVE 12/26/2018 1022   PROTEINUR NEGATIVE 12/26/2018 1022   NITRITE NEGATIVE 12/26/2018 1022   LEUKOCYTESUR NEGATIVE 12/26/2018 1022    Sepsis Labs: Lactic Acid, Venous    Component Value Date/Time   LATICACIDVEN 0.8 12/21/2018 0738    MICROBIOLOGY: Recent Results (from the past 240 hour(s))  C difficile quick scan w PCR reflex     Status: None   Collection Time: 12/24/18  1:54 AM  Result Value Ref Range Status   C Diff antigen NEGATIVE NEGATIVE Final   C Diff toxin NEGATIVE NEGATIVE Final   C Diff interpretation No C. difficile detected.  Final    Comment: Performed at [facility], [address], [place], [place] [address]  Culture, Urine     Status: Abnormal   Collection Time: 12/26/18 10:22 AM  Result Value Ref Range Status   Specimen Description URINE, CATHETERIZED  Final   Special Requests  NONE  Final   Culture (A)  Final    <  10,000 COLONIES/mL INSIGNIFICANT GROWTH Performed at [facility], [address] [place], [place] [address]    Report Status 12/27/2018 FINAL  Final  Culture, blood (routine x 2)     Status: None   Collection Time: 12/26/18 10:47 AM  Result Value Ref Range Status   Specimen Description BLOOD RIGHT ANTECUBITAL  Final   Special Requests   Final    BOTTLES DRAWN AEROBIC ONLY Blood Culture adequate volume   Culture   Final    NO GROWTH 5 DAYS Performed at [facility], [address], [place], [place] [address]    Report Status 12/31/2018 FINAL  Final  Culture, blood (routine x 2)     Status: None   Collection Time: 12/26/18 10:53 AM  Result Value Ref Range Status   Specimen Description BLOOD RIGHT ARM  Final   Special Requests   Final    BOTTLES DRAWN AEROBIC ONLY Blood Culture adequate volume   Culture   Final    NO GROWTH 5 DAYS Performed at [facility], [address] [place], [place] [address]    Report Status 12/31/2018 FINAL  Final  Aerobic/Anaerobic Culture (surgical/deep wound)     Status: None (Preliminary result)   Collection Time: 12/30/18  1:11 PM  Result Value Ref Range Status   Specimen Description TISSUE  Final   Special Requests   Final    RIGHT BICEP TENSON SHEATH PATIENT ON FOLLOWING ROCEPHIN   Gram Stain   Final    ABUNDANT WBC PRESENT,BOTH PMN AND MONONUCLEAR NO ORGANISMS SEEN Performed at [facility], [address] [place], [place] [address]    Culture PENDING  Incomplete   Report Status PENDING  Incomplete  Aerobic/Anaerobic Culture (surgical/deep wound)     Status: None (Preliminary result)   Collection Time: 12/30/18  1:24 PM  Result Value Ref Range Status   Specimen Description SYNOVIAL RIGHT SHOULDER  Final   Special Requests FLUID ON SWABS PATIENT ON FOLLOWING ROCEPHIN  Final   Gram Stain   Final    ABUNDANT WBC PRESENT, PREDOMINANTLY PMN NO ORGANISMS SEEN Performed at [facility], [address], [place], [place] [address]    Culture PENDING  Incomplete   Report Status PENDING  Incomplete    RADIOLOGY STUDIES/RESULTS: Dg Chest 1 View  Result Date: 12/26/2018 CLINICAL DATA:  Fever.  Extubated. EXAM: CHEST  1 VIEW COMPARISON:  12/23/2018 FINDINGS: Endotracheal tube and nasogastric tube have been removed. Improved but mild and persistent infiltrate/atelectasis in the lower lobes left more than right. No worsening or new finding. IMPRESSION: Endotracheal tube and nasogastric tube removed. Better aeration of the lower lungs. Mild persistent atelectasis/infiltrate in the lower lobes, left more than right. Electronically Signed   By: Paulina Fusi M.D.   On: 12/26/2018 10:43   Dg Chest 2 View  Result Date: 12/17/2018 CLINICAL DATA:  Seizure, fever EXAM: CHEST - 2 VIEW COMPARISON:  09/09/2018 chest radiograph. FINDINGS: Stable cardiomediastinal silhouette with normal heart size. No pneumothorax. No pleural effusion. Lungs appear clear, with no acute consolidative airspace disease and no pulmonary edema. IMPRESSION: No active cardiopulmonary disease. Electronically Signed   By: Delbert Phenix M.D.   On: 12/17/2018 20:47   Dg Shoulder Right  Result Date: 12/20/2018 CLINICAL DATA:  Right shoulder pain. EXAM: RIGHT SHOULDER - 2+ VIEW COMPARISON:  None. FINDINGS: There is no evidence of fracture or dislocation. There is no evidence of arthropathy or other focal bone abnormality.  Soft tissues are unremarkable. IMPRESSION: Negative. Electronically Signed   By: Ted Mcalpine M.D.   On: 12/20/2018 15:59   Ct Head Wo Contrast  Result Date: 12/17/2018 CLINICAL DATA:  Seizure, head trauma EXAM: CT HEAD WITHOUT CONTRAST TECHNIQUE: Contiguous axial images were obtained from the base of the skull through the vertex without intravenous contrast. COMPARISON:  MRI 09/18/2018 FINDINGS: Brain: No acute intracranial abnormality. Specifically, no hemorrhage, hydrocephalus, mass lesion,  acute infarction, or significant intracranial injury. Vascular: No hyperdense vessel or unexpected calcification. Skull: No acute calvarial abnormality. Sinuses/Orbits: Visualized paranasal sinuses and mastoids clear. Orbital soft tissues unremarkable. Other: None IMPRESSION: No acute intracranial abnormality. Electronically Signed   By: Charlett Nose M.D.   On: 12/17/2018 20:08   US Renal  Result Date: 12/21/2018 CLINICAL DATA:  Acute kidney injury EXAM: RENAL / URINARY TRACT ULTRASOUND COMPLETE COMPARISON:  None. FINDINGS: Right Kidney: Renal measurements: 13.8 x 5.8 x 6.3 cm = volume: 268.4 mL . Echogenicity within normal limits. No mass or hydronephrosis visualized. Trace right perinephric fluid. Left Kidney: Renal measurements: 14.3 x 6.4 x 7.6 cm = volume: 371.8 mL. Echogenicity within normal limits. No mass or hydronephrosis visualized. Bladder: Appears normal for degree of bladder distention. IMPRESSION: Negative renal ultrasound Electronically Signed   By: Jasmine Pang M.D.   On: 12/21/2018 01:17   Mr Shoulder Right W Wo Contrast  Result Date: 12/29/2018 CLINICAL DATA:  Fever and sepsis. Strep pyogenes bacteremia. Right lower extremity cellulitis. Right shoulder swelling and pain. Question infection. EXAM: MRI OF THE RIGHT SHOULDER WITHOUT AND WITH CONTRAST TECHNIQUE: Multiplanar, multisequence MR imaging of the right shoulder was performed before and after the administration of intravenous contrast. CONTRAST:  10 cc Gadavist IV. COMPARISON:  None. FINDINGS: Rotator cuff: There is some thickening and heterogeneously increased T2 signal in the rotator cuff tendons consistent with tendinopathy. No tear. Muscles: Edema and mild enhancement are seen in the posterior subscapularis and infraspinatus muscle bellies. Biceps long head: Intact. There is fluid in the tendon sheath of the long head of biceps. A rim enhancing fluid collection along the medial margin of the biceps tendon distal to the bicipital  groove measures up to 1.2 cm AP by 1.2 cm transverse. The collection extends below the inferior margin of the scan and may involve the biceps muscle. Acromioclavicular Joint: Mild degenerative change is present. Type 1 acromion. Only a small amount of fluid is seen in the subacromial/subdeltoid bursa. Glenohumeral Joint: The patient has a moderately large glenohumeral joint effusion with marked synovial thickening and enhancement. Labrum:  The superior labrum is severely degenerated. Bones: There is marrow edema and enhancement in the posterior aspect of the humeral head. Subchondral edema is seen in the anterior, inferior glenoid. No fracture. Other: None. IMPRESSION: Large glenohumeral joint effusion with intense synovial enhancement and thickening highly suspicious for septic glenohumeral joint. Marrow edema in the posterior aspect of the humeral head could be related to rotator cuff tendinopathy but may also reflect osteomyelitis. Rim enhancing fluid collection along the medial margin of the biceps tendon distal to the bicipital groove extends below the inferior margin of the study and is most consistent with abscess. Edema and enhancement within the muscle belly of the posterior infraspinatus and anterior subscapularis are consistent with myositis. Electronically Signed   By: Drusilla Kanner M.D.   On: 12/29/2018 15:33   Dg Chest Port 1 View  Result Date: 12/23/2018 CLINICAL DATA:  Endotracheal tube EXAM: PORTABLE CHEST 1 VIEW COMPARISON:  Yesterday FINDINGS: Endotracheal  tube tip is just below the clavicular heads, improved. Orogastric tube at least reaches the stomach. Cardiopericardial enlargement and lower lobe opacities with left-sided air bronchogram. Layering pleural fluid on the right at least. No pneumothorax is seen. IMPRESSION: 1. Improved endotracheal tube positioning. 2. Atelectasis or pneumonia at the bases with layering pleural fluid. Electronically Signed   By: Marnee Spring M.D.   On:  12/23/2018 06:43   Dg Chest Port 1 View  Result Date: 12/22/2018 CLINICAL DATA:  Shortness of breath. EXAM: PORTABLE CHEST 1 VIEW COMPARISON:  12/21/2018 FINDINGS: Endotracheal tube 9.6 cm above the carina. Advancement with approximately 5 cm may be considered. Enteric catheter tip collimated off the image. Cardiomediastinal silhouette is enlarged. Mediastinal contours appear intact. There is no evidence of pneumothorax. Bilateral pleural effusions with lower lobe atelectasis versus airspace consolidation. Interstitial pulmonary edema. Osseous structures are without acute abnormality. Soft tissues are grossly normal. IMPRESSION: 1. Enlarged cardiac silhouette. 2. Interstitial pulmonary edema. 3. Bilateral pleural effusions with lower lobe atelectasis versus airspace consolidation. 4. Endotracheal tube may be advanced by 5 cm. Electronically Signed   By: Ted Mcalpine M.D.   On: 12/22/2018 09:01   Portable Chest X-ray  Result Date: 12/21/2018 CLINICAL DATA:  Endotracheal tube EXAM: PORTABLE CHEST 1 VIEW COMPARISON:  Earlier today FINDINGS: Endotracheal tube tip at the clavicular heads. Orogastric tube with tip reaching the stomach. Interstitial opacity with now apparent bilateral layering pleural effusions. No pneumothorax. Normal heart size. 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 IMPRESSION: 1. New hardware in unremarkable position. 2. Pulmonary edema and layering pleural effusions. Electronically Signed   By: Marnee Spring M.D.   On: 12/21/2018 09:08   Dg Chest Port 1v Same Day  Result Date: 12/21/2018 CLINICAL DATA:  Shortness of breath EXAM: PORTABLE CHEST 1 VIEW COMPARISON:  Three days ago FINDINGS: Diffuse interstitial opacity that is symmetric. Perihilar airspace opacity. Borderline heart size accentuated by technique. No effusion or pneumothorax IMPRESSION: Pulmonary edema. Electronically Signed   By: Marnee Spring M.D.   On: 12/21/2018 08:28   Dg Chest Port 1v Same  Day  Result Date: 12/18/2018 CLINICAL DATA:  Shortness of breath EXAM: PORTABLE CHEST 1 VIEW COMPARISON:  December 17, 2018 FINDINGS: The heart size and mediastinal contours are within normal limits. Both lungs are clear. The visualized skeletal structures are unremarkable. IMPRESSION: No active disease. Electronically Signed   By: Gerome Sam III M.D   On: 12/18/2018 16:01   Vas [place] Upper Extremity Venous Duplex  Result Date: 12/25/2018 UPPER VENOUS STUDY  Indications: Swelling Limitations: Patient positioning, wrist restraints. Performing Technologist: Chanda Busing RVT  Examination Guidelines: A complete evaluation includes B-mode imaging, spectral Doppler, color Doppler, and power Doppler as needed of all accessible portions of each vessel. Bilateral testing is considered an integral part of a complete examination. Limited examinations for reoccurring indications may be performed as noted.  Right Findings: +----------+------------+---------+-----------+----------+--------------+  RIGHT      Compressible Phasicity Spontaneous Properties    Summary      +----------+------------+---------+-----------+----------+--------------+  IJV            Full        Yes        Yes                                +----------+------------+---------+-----------+----------+--------------+  Subclavian     Full        Yes  Yes                                +----------+------------+---------+-----------+----------+--------------+  Axillary       Full        Yes        Yes                                +----------+------------+---------+-----------+----------+--------------+  Brachial       Full        Yes        Yes                                +----------+------------+---------+-----------+----------+--------------+  Radial         Full                                                      +----------+------------+---------+-----------+----------+--------------+  Ulnar                                                     Not visualized  +----------+------------+---------+-----------+----------+--------------+  Cephalic       Full                                                      +----------+------------+---------+-----------+----------+--------------+  Basilic        Full                                                      +----------+------------+---------+-----------+----------+--------------+  Left Findings: +----------+------------+---------+-----------+----------+-------+  LEFT       Compressible Phasicity Spontaneous Properties Summary  +----------+------------+---------+-----------+----------+-------+  Subclavian     Full        Yes        Yes                         +----------+------------+---------+-----------+----------+-------+  Summary:  Right: No evidence of deep vein thrombosis in the upper extremity. No evidence of superficial vein thrombosis in the upper extremity.  Left: No evidence of thrombosis in the subclavian.  *See table(s) above for measurements and observations.    Preliminary    [place] Ekg Site Rite  Result Date: 12/30/2018 If [facility] image not attached, placement could not be confirmed due to current cardiac rhythm.  US Abdomen Limited Ruq  Result Date: 12/19/2018 CLINICAL DATA:  Elevated LFTs EXAM: ULTRASOUND ABDOMEN LIMITED RIGHT UPPER QUADRANT COMPARISON:  None. FINDINGS: Gallbladder: Evaluation of the gallbladder is limited. There appears to be a thickened wall measuring up to 5.5 mm. No stones, pericholecystic fluid, or Murphy's sign identified. Common bile duct: Diameter: 4.0 mm Liver: No focal lesion identified. Within  normal limits in parenchymal echogenicity. Portal vein is patent on color Doppler imaging with normal direction of blood flow towards the liver. IMPRESSION: 1. The study is limited due to patient condition and difficulty following instructions. The gallbladder is not well assessed as it is contracted. The wall thickening to 5.5 mm could be due to lack of distention. No  stones, pericholecystic fluid, or Murphy's sign identified. If there is concern for acute cholecystitis, recommend a HIDA scan. 2. No other abnormalities. Electronically Signed   By: Gerome Sam III M.D   On: 12/19/2018 13:54     LOS: 14 days   Jeoffrey Massed, MD  [facility]  If 7PM-7AM, please contact night-coverage  Please page via [email or web address]  Go to [email or web address] and use [facility] universal password to access. If you do not have the password, please contact the hospital operator.  Locate the [facility] provider you are looking for under [facility] and page to a number that you can be directly reached. If you still have difficulty reaching the provider, please page the [facility] (Director on Call) for the Hospitalists listed on amion for assistance.  12/31/2018, 2:06 PM

## 2018-12-31 NOTE — Progress Notes (Signed)
Subjective: Patient stable.  Shoulder predictably sore.  Patient did not have evidence at the time of surgery of any softening of the bone or cartilage in the humeral head region.   Objective: Vital signs in last 24 hours: Temp:  [97.5 F (36.4 C)-99.2 F (37.3 C)] 98.8 F (37.1 C) (03/21 0432) Pulse Rate:  [0-89] 83 (03/21 0842) Resp:  [15-18] 18 (03/21 0432) BP: (125-154)/(74-98) 154/87 (03/21 0842) SpO2:  [92 %-96 %] 96 % (03/21 0432) Weight:  [89.8 kg] 89.8 kg (03/20 1153)  Intake/Output from previous day: 03/20 0701 - 03/21 0700 In: 1836.6 [I.V.:1736.6; IV Piggyback:100] Out: 1425 [Urine:1400; Blood:25] Intake/Output this shift: Total I/O In: -  Out: 500 [Urine:500]  Exam:  No cellulitis present  Labs: Recent Labs    12/30/18 2158 12/31/18 0439  HGB 10.3* 9.7*   Recent Labs    12/30/18 2158 12/31/18 0439  WBC 7.8 10.6*  RBC 2.97* 2.77*  HCT 29.8* 28.2*  PLT 446* 660*   Recent Labs    12/30/18 2158 12/31/18 0439  NA  --  134*  K  --  3.8  CL  --  99  CO2  --  22  BUN  --  12  CREATININE 1.13 1.10  GLUCOSE  --  107*  CALCIUM  --  9.4   No results for input(s): LABPT, INR in the last 72 hours.  Assessment/Plan: Plan at this time is for the patient to undergo occupational therapy to really get as much motion as he can in the shoulder.  Concern here would be for recurrent infection and shoulder stiffness.  PICC line placement pending.  He will need home exercise regimen of treatment for shoulder range of motion.  Follow-up with me in 10 days.   G Scott Erlin Gardella 12/31/2018, 9:05 AM

## 2018-12-31 NOTE — Discharge Instructions (Signed)
Okay to shower dressing waterproof right shoulder Okay for as much range of motion as tolerated in the right shoulder both actively and passively Return to clinic in 10 days for suture removal  Heart failure prevention instruction: Cut back or stop alcohol Limit salt intake  Limit daily fluid intake to 32-64 oz Daily weight every morning, call cardiology if weight increase by more than 3 lbs overnight or 5 lbs in a single week.

## 2019-01-01 LAB — CBC
HCT: 28.6 % — ABNORMAL LOW (ref 39.0–52.0)
Hemoglobin: 9.8 g/dL — ABNORMAL LOW (ref 13.0–17.0)
MCH: 34.3 pg — ABNORMAL HIGH (ref 26.0–34.0)
MCHC: 34.3 g/dL (ref 30.0–36.0)
MCV: 100 fL (ref 80.0–100.0)
Platelets: 588 10*3/uL — ABNORMAL HIGH (ref 150–400)
RBC: 2.86 MIL/uL — ABNORMAL LOW (ref 4.22–5.81)
RDW: 11.8 % (ref 11.5–15.5)
WBC: 6 10*3/uL (ref 4.0–10.5)
nRBC: 0 % (ref 0.0–0.2)

## 2019-01-01 MED ORDER — SODIUM CHLORIDE 0.9% FLUSH
10.0000 mL | INTRAVENOUS | Status: DC | PRN
  Administered 2019-01-02: 10 mL
  Filled 2019-01-01: qty 40

## 2019-01-01 MED ORDER — FLEET ENEMA 7-19 GM/118ML RE ENEM: 1.0000 | ENEMA | Freq: Every day | RECTAL | Status: DC | PRN

## 2019-01-01 MED ORDER — NICOTINE 14 MG/24HR TD PT24
14.0000 mg | MEDICATED_PATCH | Freq: Every day | TRANSDERMAL | Status: DC
  Administered 2019-01-02: 14 mg via TRANSDERMAL
  Filled 2019-01-01: qty 1

## 2019-01-01 MED ORDER — NICOTINE 7 MG/24HR TD PT24
7.0000 mg | MEDICATED_PATCH | Freq: Every day | TRANSDERMAL | Status: DC
  Administered 2019-01-01: 7 mg via TRANSDERMAL
  Filled 2019-01-01: qty 1

## 2019-01-01 NOTE — Progress Notes (Addendum)
Peripherally Inserted Central Catheter/Midline Placement  The IV Nurse has discussed with the patient and/or persons authorized to consent for the patient, the purpose of this procedure and the potential benefits and risks involved with this procedure.  The benefits include less needle sticks, lab draws from the catheter, and the patient may be discharged home with the catheter. Risks include, but not limited to, infection, bleeding, blood clot (thrombus formation), and puncture of an artery; nerve damage and irregular heartbeat and possibility to perform a PICC exchange if needed/ordered by physician.  Alternatives to this procedure were also discussed.  Bard Power PICC patient education guide, fact sheet on infection prevention and patient information card has been provided to patient /or left at bedside. Extensive teach performed with teach back performed with patient and mother.   PICC/Midline Placement Documentation  PICC Single Lumen 01/01/19 PICC Left Brachial 43 cm 0 cm (Active)  Indication for Insertion or Continuance of Line Home intravenous therapies (PICC only) 01/01/2019  6:00 PM  Exposed Catheter (cm) 0 cm 01/01/2019  6:00 PM  Site Assessment Clean;Dry;Intact 01/01/2019  6:00 PM  Line Status Flushed;Blood return noted;Saline locked 01/01/2019  6:00 PM  Dressing Type Transparent 01/01/2019  6:00 PM  Dressing Status Clean;Dry;Intact;Antimicrobial disc in place 01/01/2019  6:00 PM  Line Care Connections checked and tightened 01/01/2019  6:00 PM  Dressing Change Due 01/08/19 01/01/2019  6:00 PM       Edwin Cap 01/01/2019, 6:20 PM

## 2019-01-01 NOTE — Progress Notes (Signed)
Verbal order from Dr. Jerral Ralph to increase nicotine patch from 7 mg to 14 mg

## 2019-01-01 NOTE — Anesthesia Postprocedure Evaluation (Signed)
Anesthesia Post Note  Patient: Darren Diaz  Procedure(s) Performed: ARTHROSCOPIC IRRIGATION AND DEBRIDEMENT OF SHOULDER JOINT (Right Shoulder) Irrigation And Debridement Biceps Tendon Sheath (Right Shoulder)     Patient location during evaluation: PACU Anesthesia Type: General Level of consciousness: awake and alert Pain management: pain level controlled Vital Signs Assessment: post-procedure vital signs reviewed and stable Respiratory status: spontaneous breathing, nonlabored ventilation, respiratory function stable and patient connected to nasal cannula oxygen Cardiovascular status: blood pressure returned to baseline and stable Postop Assessment: no apparent nausea or vomiting Anesthetic complications: no    Last Vitals:  Vitals:   01/01/19 0919 01/01/19 1422  BP: 132/87 136/77  Pulse: 99 80  Resp:  18  Temp:  37.6 C  SpO2:  98%    Last Pain:  Vitals:   01/01/19 1422  TempSrc: Oral  PainSc:                  Darren Diaz

## 2019-01-01 NOTE — Progress Notes (Signed)
Attempted to place PICC in left arm. During cleaning the arm, patient contaminated the field multiple times. Patient moved his arm outside field area after cleaning the site causing original sterile chuck to fall from bed. This author instructed patient to keep his arm still after cleaning again and the purpose in reducing infection risk.Another sterile chuck was used, while clean the area again. During preparing the PICC tray, the patient bent his arm again contaminating the insertion site. Again, I informed the patient to keep his arm in position and the risk of infection. The area was cleaned again, this author attempted to don sterile gown and gloves, and visualized the patient reaching over to scratch his left arm again. I asked the patient if he were nervous. He stated, " Should I be?" and again I explained the need to maintain his arm in position. He stated that, "I'm not medical." I told him that I'm not able to place the PICC in a safe manner. I told the patient and patient's mother that I would need assistance in placing it safely.  I spoke with the patient's nurse and asked if the had any mental retardation or intellectual disability, who said that he was a little "off". Another attempt will be made this afternoon, when another PICC nurse is available to assist.

## 2019-01-01 NOTE — Progress Notes (Signed)
HHPT ordered per PT/OT rec

## 2019-01-01 NOTE — Progress Notes (Signed)
Spoke with Dr Jerral Ralph re concern of pts mental capacity/impulsiveness during previous attempt and ability to maintain the PICC at home.  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  Will proceed with PICC and reinforce instructions with pt and parents.

## 2019-01-01 NOTE — Progress Notes (Signed)
PROGRESS NOTE        PATIENT DETAILS Name: Darren Diaz Age: 33 y.o. Sex: male Date of Birth: October 19, 1985 Admit Date: 12/17/2018 Admitting Physician Hillary Bow, DO SRP:[id number], No Pcp Per  Brief Narrative: Patient is a 33 y.o. male with history of alcohol use, seizure disorder-presenting with breakthrough seizure, fever-found to have sepsis pathophysiology secondary to Streptococcus pyogenes bacteremia and right lower extremity cellulitis.  Unfortunately, patient continued to deteriorate, and developed delirium tremens, and acute hypoxic respiratory failure-he was intubated and managed by CCM in the ICU.  Subsequently improved, was extubated on 3/13, and transferred to [facility] on 3/14.  Subjective: Some pain in the right shoulder area but denies any other complaints, wants his nicotine patch dosage to be reduced.  Assessment/Plan: Sepsis secondary to Streptococcus pyogenes bacteremia with right lower extremity cellulitis and right shoulder septic arthritis: Sepsis pathophysiology has resolved, TTE negative for endocarditis.  Initially on pen G-transitioned to oral amoxicillin-but on 3/19 underwent MRI of the right shoulder due to worsening pain and was found to have septic arthritis.  Underwent I&D on 3/20.  Now on IV Rocephin-we will place PICC line.  Probably will require at least 6 weeks of IV antimicrobial therapy-we will await further recommendations from infectious disease over the next few days  Right shoulder pain: Secondary to septic arthritis of the right shoulder.  See above.  Right upper extremity Doppler was negative for DVT on 3/15.  Acute hypoxic respiratory failure: Secondary to sepsis, worsening DTs-intubated on 3/11-and extubated on 3/13.  Currently on room air.    Acute metabolic encephalopathy: Resolved.  Secondary to alcohol withdrawal/DTs and sepsis.  CT head negative for acute abnormalities, EEG without any seizures.  Currently completely awake  and alert.    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  Continue Coreg, Entresto and Imdur.  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  Hypertension: Controlled-continue Coreg, Entresto and Imdur.  Alcohol withdrawal with DTs: Required transfer to ICU-intubation and mechanical ventilation-required Precedex infusion while in the ICU.  Withdrawal symptoms have resolved-currently on tapering clonidine.     Seizure disorder: Continue Keppra-EEG negative for seizures.    Transaminitis: Resolved.  Likely secondary to sepsis/shock liver, AST/ALT downtrending.  RUQ ultrasound unremarkable.   AKI: Suspect hemodynamically mediated-improving with supportive care.    DVT Prophylaxis: Prophylactic Lovenox   Code Status: Full code  Family Communication: None at bedside  Disposition Plan: Remain inpatient-Home with home health services sometime next week  Antimicrobial agents: Anti-infectives (From admission, onward)   Start     Dose/Rate Route Frequency Ordered Stop   12/30/18 1130  cefTRIAXone (ROCEPHIN) 2 g in sodium chloride 0.9 % 100 mL IVPB     2 g 200 mL/hr over 30 Minutes Intravenous Every 24 hours 12/30/18 1010     12/28/18 1400  amoxicillin (AMOXIL) capsule 500 mg  Status:  Discontinued     500 mg Oral Every 8 hours 12/28/18 1304 12/30/18 1010   12/19/18 1700  penicillin G potassium 12 Million Units in dextrose 5 % 500 mL continuous infusion  Status:  Discontinued     12 Million Units 41.7 mL/hr over 12 Hours Intravenous Every 12 hours 12/19/18 1144 12/28/18 1304   12/19/18 0400  vancomycin (VANCOCIN) 1,500 mg in sodium chloride 0.9 % 500 mL IVPB  Status:  Discontinued     1,500 mg 250 mL/hr over 120 Minutes Intravenous Every 12 hours 12/18/18 1530 12/19/18 0901   12/18/18 1600  vancomycin (VANCOCIN) 2,000 mg in sodium chloride 0.9 % 500 mL IVPB     2,000 mg 250 mL/hr  over 120 Minutes Intravenous  Once 12/18/18 1530 12/19/18 0748   12/18/18 1530  cefTRIAXone (ROCEPHIN) 2 g in sodium chloride 0.9 % 100 mL IVPB  Status:  Discontinued     2 g 200 mL/hr over 30 Minutes Intravenous Every 24 hours 12/18/18 1519 12/19/18 1115   12/18/18 1530  metroNIDAZOLE (FLAGYL) IVPB 500 mg  Status:  Discontinued     500 mg 100 mL/hr over 60 Minutes Intravenous Every 8 hours 12/18/18 1519 12/19/18 0901     Procedures: None  CONSULTS: ID, PCCM and orthopedics  Time spent: 25 minutes  MEDICATIONS: Scheduled Meds:  carvedilol  12.5 mg Oral BID WC   enoxaparin (LOVENOX) injection  40 mg Subcutaneous Q24H   feeding supplement (ENSURE ENLIVE)  237 mL Oral BID BM   folic acid  1 mg Oral Daily   gabapentin  300 mg Oral TID   isosorbide dinitrate  10 mg Oral TID   levETIRAcetam  1,000 mg Oral BID   mouth rinse  15 mL Mouth Rinse BID   multivitamin with minerals  1 tablet Oral Daily   nicotine  7 mg Transdermal Daily   pantoprazole  40 mg Oral Daily   polyethylene glycol  17 g Oral Daily   vitamin B-6  100 mg Oral Daily   sacubitril-valsartan  1 tablet Oral BID   thiamine  100 mg Oral Daily   Continuous Infusions:  cefTRIAXone (ROCEPHIN)  IV 2 g (01/01/19 1141)   methocarbamol (ROBAXIN) IV     PRN Meds:.acetaminophen, bisacodyl, Gerhardt's butt cream, haloperidol lactate, hydrALAZINE, levalbuterol, methocarbamol **OR** methocarbamol (ROBAXIN) IV, metoCLOPramide **OR** metoCLOPramide (REGLAN) injection, morphine injection, ondansetron **OR** ondansetron (ZOFRAN) IV, oxyCODONE, phenol, sennosides, sodium phosphate   PHYSICAL EXAM: Vital signs: Vitals:   12/31/18 2128 01/01/19 0020 01/01/19 0422 01/01/19 0919  BP: 129/79  135/83 132/87  Pulse: 66  74 99  Resp: 16  18   Temp: 98.9 F (37.2 C)  99 F (37.2 C)   TempSrc: Oral  Oral   SpO2: 98%  94%   Weight:  89.9 kg    Height:       Filed Weights   12/30/18 0558 12/30/18 1153 01/01/19 0020    Weight: 89.8 kg 89.8 kg 89.9 kg   Body mass index is 27.64 kg/m.  General appearance:Awake, alert, not in any distress.  Eyes:no scleral icterus. HEENT: Atraumatic and Normocephalic Neck: supple, no JVD. Resp:Good air entry bilaterally,no rales or rhonchi CVS: S1 S2 regular, no murmurs.  GI: Bowel sounds present, Non tender and not distended with no gaurding, rigidity or rebound. Extremities: B/L Lower Ext shows no edema, both legs are warm to touch Neurology:  Non focal Psychiatric: Normal judgment and insight. Normal mood. Musculoskeletal:No digital cyanosis Skin:No Rash, warm and dry Wounds:N/A  I have personally reviewed following labs and imaging studies  LABORATORY DATA: CBC: Recent Labs  Lab 12/27/18 0219 12/28/18 0353 12/30/18 2158 12/31/18 0439 01/01/19 0504  WBC 7.5   7.6 7.7 7.8 10.6* 6.0  NEUTROABS 5.3  --   --   --   --   HGB 10.1*   10.1* 9.9* 10.3* 9.7* 9.8*  HCT 30.6*   29.9* 29.8* 29.8* 28.2* 28.6*  MCV 103.4*  101.7* 102.4* 100.3* 101.8* 100.0  PLT 490*   480* 528* 446* 660* 588*    Basic Metabolic Panel: Recent Labs  Lab 12/26/18 0421 12/27/18 0219 12/28/18 0353 12/30/18 2158 12/31/18 0439  NA 139 138 137  --  134*  K 4.6 3.9 4.5  --  3.8  CL 115* 110 103  --  99  CO2 17* 21* 26  --  22  GLUCOSE 99 105* 95  --  107*  BUN 16 13 13   --  12  CREATININE 1.45* 1.48* 1.27* 1.13 1.10  CALCIUM 8.4* 8.4* 8.7*  --  9.4  MG  --  1.7 1.9  --   --   PHOS  --  4.0  --   --   --     GFR: Estimated Creatinine Clearance: 100.8 mL/min (by C-G formula based on SCr of 1.1 mg/dL).  Liver Function Tests: Recent Labs  Lab 12/26/18 0421 12/28/18 0353  AST 38 34  ALT 46* 37  ALKPHOS 47 44  BILITOT 1.3* 0.9  PROT 6.5 6.3*  ALBUMIN 2.3* 2.2*   No results for input(s): LIPASE, AMYLASE in the last 168 hours. No results for input(s): AMMONIA in the last 168 hours.  Coagulation Profile: No results for input(s): INR, PROTIME in the last 168  hours.  Cardiac Enzymes: No results for input(s): CKTOTAL, CKMB, CKMBINDEX, TROPONINI in the last 168 hours.  BNP (last 3 results) No results for input(s): PROBNP in the last 8760 hours.  HbA1C: No results for input(s): HGBA1C in the last 72 hours.  CBG: Recent Labs  Lab 12/26/18 0836  GLUCAP 95    Lipid Profile: No results for input(s): CHOL, HDL, LDLCALC, TRIG, CHOLHDL, LDLDIRECT in the last 72 hours.  Thyroid Function Tests: No results for input(s): TSH, T4TOTAL, FREET4, T3FREE, THYROIDAB in the last 72 hours.  Anemia Panel: No results for input(s): VITAMINB12, FOLATE, FERRITIN, TIBC, IRON, RETICCTPCT in the last 72 hours.  Urine analysis:    Component Value Date/Time   COLORURINE YELLOW 12/26/2018 1022   APPEARANCEUR CLEAR 12/26/2018 1022   LABSPEC 1.011 12/26/2018 1022   PHURINE 5.0 12/26/2018 1022   GLUCOSEU NEGATIVE 12/26/2018 1022   HGBUR SMALL (A) 12/26/2018 1022   BILIRUBINUR NEGATIVE 12/26/2018 1022   KETONESUR NEGATIVE 12/26/2018 1022   PROTEINUR NEGATIVE 12/26/2018 1022   NITRITE NEGATIVE 12/26/2018 1022   LEUKOCYTESUR NEGATIVE 12/26/2018 1022    Sepsis Labs: Lactic Acid, Venous    Component Value Date/Time   LATICACIDVEN 0.8 12/21/2018 0738    MICROBIOLOGY: Recent Results (from the past 240 hour(s))  C difficile quick scan w PCR reflex     Status: None   Collection Time: 12/24/18  1:54 AM  Result Value Ref Range Status   C Diff antigen NEGATIVE NEGATIVE Final   C Diff toxin NEGATIVE NEGATIVE Final   C Diff interpretation No C. difficile detected.  Final    Comment: Performed at [facility], [address], [place], [place] [address]  Culture, Urine     Status: Abnormal   Collection Time: 12/26/18 10:22 AM  Result Value Ref Range Status   Specimen Description URINE, CATHETERIZED  Final   Special Requests NONE  Final   Culture (A)  Final    <10,000 COLONIES/mL INSIGNIFICANT GROWTH Performed at [facility], [address], [place], [place] [address]    Report Status 12/27/2018 FINAL  Final  Culture, blood (routine x 2)     Status: None  Collection Time: 12/26/18 10:47 AM  Result Value Ref Range Status   Specimen Description BLOOD RIGHT ANTECUBITAL  Final   Special Requests   Final    BOTTLES DRAWN AEROBIC ONLY Blood Culture adequate volume   Culture   Final    NO GROWTH 5 DAYS Performed at [facility], [address], [place], [place] [address]    Report Status 12/31/2018 FINAL  Final  Culture, blood (routine x 2)     Status: None   Collection Time: 12/26/18 10:53 AM  Result Value Ref Range Status   Specimen Description BLOOD RIGHT ARM  Final   Special Requests   Final    BOTTLES DRAWN AEROBIC ONLY Blood Culture adequate volume   Culture   Final    NO GROWTH 5 DAYS Performed at [facility], [address] [place], [place] [address]    Report Status 12/31/2018 FINAL  Final  Aerobic/Anaerobic Culture (surgical/deep wound)     Status: None (Preliminary result)   Collection Time: 12/30/18  1:11 PM  Result Value Ref Range Status   Specimen Description TISSUE  Final   Special Requests   Final    RIGHT BICEP TENSON SHEATH PATIENT ON FOLLOWING ROCEPHIN   Gram Stain   Final    ABUNDANT WBC PRESENT,BOTH PMN AND MONONUCLEAR NO ORGANISMS SEEN    Culture   Final    NO GROWTH 1 DAY Performed at [facility], [address], [place], [place] [address]    Report Status PENDING  Incomplete  Aerobic/Anaerobic Culture (surgical/deep wound)     Status: None (Preliminary result)   Collection Time: 12/30/18  1:24 PM  Result Value Ref Range Status   Specimen Description SYNOVIAL RIGHT SHOULDER  Final   Special Requests FLUID ON SWABS PATIENT ON FOLLOWING ROCEPHIN  Final   Gram Stain   Final    ABUNDANT WBC PRESENT, PREDOMINANTLY PMN NO ORGANISMS SEEN    Culture   Final    NO GROWTH 1 DAY Performed at [facility], [address] [place], [place] [address]    Report Status  PENDING  Incomplete    RADIOLOGY STUDIES/RESULTS: Dg Chest 1 View  Result Date: 12/26/2018 CLINICAL DATA:  Fever.  Extubated. EXAM: CHEST  1 VIEW COMPARISON:  12/23/2018 FINDINGS: Endotracheal tube and nasogastric tube have been removed. Improved but mild and persistent infiltrate/atelectasis in the lower lobes left more than right. No worsening or new finding. IMPRESSION: Endotracheal tube and nasogastric tube removed. Better aeration of the lower lungs. Mild persistent atelectasis/infiltrate in the lower lobes, left more than right. Electronically Signed   By: Paulina Fusi M.D.   On: 12/26/2018 10:43   Dg Chest 2 View  Result Date: 12/17/2018 CLINICAL DATA:  Seizure, fever EXAM: CHEST - 2 VIEW COMPARISON:  09/09/2018 chest radiograph. FINDINGS: Stable cardiomediastinal silhouette with normal heart size. No pneumothorax. No pleural effusion. Lungs appear clear, with no acute consolidative airspace disease and no pulmonary edema. IMPRESSION: No active cardiopulmonary disease. Electronically Signed   By: Delbert Phenix M.D.   On: 12/17/2018 20:47   Dg Shoulder Right  Result Date: 12/20/2018 CLINICAL DATA:  Right shoulder pain. EXAM: RIGHT SHOULDER - 2+ VIEW COMPARISON:  None. FINDINGS: There is no evidence of fracture or dislocation. There is no evidence of arthropathy or other focal bone abnormality. Soft tissues are unremarkable. IMPRESSION: Negative. Electronically Signed   By: Ted Mcalpine M.D.   On: 12/20/2018 15:59   Ct Head  Wo Contrast  Result Date: 12/17/2018 CLINICAL DATA:  Seizure, head trauma EXAM: CT HEAD WITHOUT CONTRAST TECHNIQUE: Contiguous axial images were obtained from the base of the skull through the vertex without intravenous contrast. COMPARISON:  MRI 09/18/2018 FINDINGS: Brain: No acute intracranial abnormality. Specifically, no hemorrhage, hydrocephalus, mass lesion, acute infarction, or significant intracranial injury. Vascular: No hyperdense vessel or unexpected  calcification. Skull: No acute calvarial abnormality. Sinuses/Orbits: Visualized paranasal sinuses and mastoids clear. Orbital soft tissues unremarkable. Other: None IMPRESSION: No acute intracranial abnormality. Electronically Signed   By: Charlett Nose M.D.   On: 12/17/2018 20:08   US Renal  Result Date: 12/21/2018 CLINICAL DATA:  Acute kidney injury EXAM: RENAL / URINARY TRACT ULTRASOUND COMPLETE COMPARISON:  None. FINDINGS: Right Kidney: Renal measurements: 13.8 x 5.8 x 6.3 cm = volume: 268.4 mL . Echogenicity within normal limits. No mass or hydronephrosis visualized. Trace right perinephric fluid. Left Kidney: Renal measurements: 14.3 x 6.4 x 7.6 cm = volume: 371.8 mL. Echogenicity within normal limits. No mass or hydronephrosis visualized. Bladder: Appears normal for degree of bladder distention. IMPRESSION: Negative renal ultrasound Electronically Signed   By: Jasmine Pang M.D.   On: 12/21/2018 01:17   Mr Shoulder Right W Wo Contrast  Result Date: 12/29/2018 CLINICAL DATA:  Fever and sepsis. Strep pyogenes bacteremia. Right lower extremity cellulitis. Right shoulder swelling and pain. Question infection. EXAM: MRI OF THE RIGHT SHOULDER WITHOUT AND WITH CONTRAST TECHNIQUE: Multiplanar, multisequence MR imaging of the right shoulder was performed before and after the administration of intravenous contrast. CONTRAST:  10 cc Gadavist IV. COMPARISON:  None. FINDINGS: Rotator cuff: There is some thickening and heterogeneously increased T2 signal in the rotator cuff tendons consistent with tendinopathy. No tear. Muscles: Edema and mild enhancement are seen in the posterior subscapularis and infraspinatus muscle bellies. Biceps long head: Intact. There is fluid in the tendon sheath of the long head of biceps. A rim enhancing fluid collection along the medial margin of the biceps tendon distal to the bicipital groove measures up to 1.2 cm AP by 1.2 cm transverse. The collection extends below the inferior  margin of the scan and may involve the biceps muscle. Acromioclavicular Joint: Mild degenerative change is present. Type 1 acromion. Only a small amount of fluid is seen in the subacromial/subdeltoid bursa. Glenohumeral Joint: The patient has a moderately large glenohumeral joint effusion with marked synovial thickening and enhancement. Labrum:  The superior labrum is severely degenerated. Bones: There is marrow edema and enhancement in the posterior aspect of the humeral head. Subchondral edema is seen in the anterior, inferior glenoid. No fracture. Other: None. IMPRESSION: Large glenohumeral joint effusion with intense synovial enhancement and thickening highly suspicious for septic glenohumeral joint. Marrow edema in the posterior aspect of the humeral head could be related to rotator cuff tendinopathy but may also reflect osteomyelitis. Rim enhancing fluid collection along the medial margin of the biceps tendon distal to the bicipital groove extends below the inferior margin of the study and is most consistent with abscess. Edema and enhancement within the muscle belly of the posterior infraspinatus and anterior subscapularis are consistent with myositis. Electronically Signed   By: Drusilla Kanner M.D.   On: 12/29/2018 15:33   Dg Chest Port 1 View  Result Date: 12/23/2018 CLINICAL DATA:  Endotracheal tube EXAM: PORTABLE CHEST 1 VIEW COMPARISON:  Yesterday FINDINGS: Endotracheal tube tip is just below the clavicular heads, improved. Orogastric tube at least reaches the stomach. Cardiopericardial enlargement and lower lobe opacities with left-sided  air bronchogram. Layering pleural fluid on the right at least. No pneumothorax is seen. IMPRESSION: 1. Improved endotracheal tube positioning. 2. Atelectasis or pneumonia at the bases with layering pleural fluid. Electronically Signed   By: Marnee Spring M.D.   On: 12/23/2018 06:43   Dg Chest Port 1 View  Result Date: 12/22/2018 CLINICAL DATA:  Shortness of  breath. EXAM: PORTABLE CHEST 1 VIEW COMPARISON:  12/21/2018 FINDINGS: Endotracheal tube 9.6 cm above the carina. Advancement with approximately 5 cm may be considered. Enteric catheter tip collimated off the image. Cardiomediastinal silhouette is enlarged. Mediastinal contours appear intact. There is no evidence of pneumothorax. Bilateral pleural effusions with lower lobe atelectasis versus airspace consolidation. Interstitial pulmonary edema. Osseous structures are without acute abnormality. Soft tissues are grossly normal. IMPRESSION: 1. Enlarged cardiac silhouette. 2. Interstitial pulmonary edema. 3. Bilateral pleural effusions with lower lobe atelectasis versus airspace consolidation. 4. Endotracheal tube may be advanced by 5 cm. Electronically Signed   By: Ted Mcalpine M.D.   On: 12/22/2018 09:01   Portable Chest X-ray  Result Date: 12/21/2018 CLINICAL DATA:  Endotracheal tube EXAM: PORTABLE CHEST 1 VIEW COMPARISON:  Earlier today FINDINGS: Endotracheal tube tip at the clavicular heads. Orogastric tube with tip reaching the stomach. Interstitial opacity with now apparent bilateral layering pleural effusions. No pneumothorax. Normal heart size. 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 IMPRESSION: 1. New hardware in unremarkable position. 2. Pulmonary edema and layering pleural effusions. Electronically Signed   By: Marnee Spring M.D.   On: 12/21/2018 09:08   Dg Chest Port 1v Same Day  Result Date: 12/21/2018 CLINICAL DATA:  Shortness of breath EXAM: PORTABLE CHEST 1 VIEW COMPARISON:  Three days ago FINDINGS: Diffuse interstitial opacity that is symmetric. Perihilar airspace opacity. Borderline heart size accentuated by technique. No effusion or pneumothorax IMPRESSION: Pulmonary edema. Electronically Signed   By: Marnee Spring M.D.   On: 12/21/2018 08:28   Dg Chest Port 1v Same Day  Result Date: 12/18/2018 CLINICAL DATA:  Shortness of breath EXAM: PORTABLE CHEST 1 VIEW  COMPARISON:  December 17, 2018 FINDINGS: The heart size and mediastinal contours are within normal limits. Both lungs are clear. The visualized skeletal structures are unremarkable. IMPRESSION: No active disease. Electronically Signed   By: Gerome Sam III M.D   On: 12/18/2018 16:01   Vas [place] Upper Extremity Venous Duplex  Result Date: 12/25/2018 UPPER VENOUS STUDY  Indications: Swelling Limitations: Patient positioning, wrist restraints. Performing Technologist: Chanda Busing RVT  Examination Guidelines: A complete evaluation includes B-mode imaging, spectral Doppler, color Doppler, and power Doppler as needed of all accessible portions of each vessel. Bilateral testing is considered an integral part of a complete examination. Limited examinations for reoccurring indications may be performed as noted.  Right Findings: +----------+------------+---------+-----------+----------+--------------+  RIGHT      Compressible Phasicity Spontaneous Properties    Summary      +----------+------------+---------+-----------+----------+--------------+  IJV            Full        Yes        Yes                                +----------+------------+---------+-----------+----------+--------------+  Subclavian     Full        Yes        Yes                                +----------+------------+---------+-----------+----------+--------------+  Axillary       Full        Yes        Yes                                +----------+------------+---------+-----------+----------+--------------+  Brachial       Full        Yes        Yes                                +----------+------------+---------+-----------+----------+--------------+  Radial         Full                                                      +----------+------------+---------+-----------+----------+--------------+  Ulnar                                                    Not visualized  +----------+------------+---------+-----------+----------+--------------+   Cephalic       Full                                                      +----------+------------+---------+-----------+----------+--------------+  Basilic        Full                                                      +----------+------------+---------+-----------+----------+--------------+  Left Findings: +----------+------------+---------+-----------+----------+-------+  LEFT       Compressible Phasicity Spontaneous Properties Summary  +----------+------------+---------+-----------+----------+-------+  Subclavian     Full        Yes        Yes                         +----------+------------+---------+-----------+----------+-------+  Summary:  Right: No evidence of deep vein thrombosis in the upper extremity. No evidence of superficial vein thrombosis in the upper extremity.  Left: No evidence of thrombosis in the subclavian.  *See table(s) above for measurements and observations.    Preliminary    [place] Ekg Site Rite  Result Date: 12/30/2018 If [facility] image not attached, placement could not be confirmed due to current cardiac rhythm.  US Abdomen Limited Ruq  Result Date: 12/19/2018 CLINICAL DATA:  Elevated LFTs EXAM: ULTRASOUND ABDOMEN LIMITED RIGHT UPPER QUADRANT COMPARISON:  None. FINDINGS: Gallbladder: Evaluation of the gallbladder is limited. There appears to be a thickened wall measuring up to 5.5 mm. No stones, pericholecystic fluid, or Murphy's sign identified. Common bile duct: Diameter: 4.0 mm Liver: No focal lesion identified. Within normal limits in parenchymal echogenicity. Portal vein is patent on color Doppler imaging with normal direction of blood flow towards the liver. IMPRESSION: 1. The study is limited due to patient condition and difficulty  following instructions. The gallbladder is not well assessed as it is contracted. The wall thickening to 5.5 mm could be due to lack of distention. No stones, pericholecystic fluid, or Murphy's sign identified. If there is concern for acute  cholecystitis, recommend a HIDA scan. 2. No other abnormalities. Electronically Signed   By: Gerome Sam III M.D   On: 12/19/2018 13:54     LOS: 15 days   Jeoffrey Massed, MD  [facility]  If 7PM-7AM, please contact night-coverage  Please page via [email or web address]  Go to [email or web address] and use [facility] universal password to access. If you do not have the password, please contact the hospital operator.  Locate the [facility] provider you are looking for under [facility] and page to a number that you can be directly reached. If you still have difficulty reaching the provider, please page the [facility] (Director on Call) for the Hospitalists listed on amion for assistance.  01/01/2019, 12:29 PM

## 2019-01-01 NOTE — Progress Notes (Signed)
Occupational Therapy Treatment Patient Details Name: Darren Diaz MRN: [id number] DOB: Mar 07, 1986 Today's Date: 01/01/2019    History of present illness Patient male who presents with seizure x8 mins secondary to ETOH withdrawal, sepsis, bacteremia and newly diagnosed systolic heart failure. Intubated 3/11-3/13. Pt on 12/30/18 had a right shoulder open excision of the biceps tendon sheath phlegmon below and around the pectoralis major insertion with arthroscopic I and D and extensive debridement with placement of Stimulan beads. PMH includes ETOH use, PDA with closure at age 49.    OT comments  Pt. Making gains with skilled OT.  Able to complete HEP (2 handouts) that were provided yesterday.  Tolerating well, intermittent cues for technique.    Follow Up Recommendations       Equipment Recommendations       Recommendations for Other Services  OP not available, so HH services    Precautions / Restrictions Precautions Precautions: Fall Precaution Comments: pendulums, AROM/PROM of RUE        Mobility Bed Mobility                  Transfers                      Balance                                           ADL either performed or assessed with clinical judgement   ADL    reviewed donning garments with RUE first then LUE.  Also discussed modifications of grooming tasks ie: teeth brushing, hair, and shaving. (pt. Is R hand dominant)                                           Vision       Perception     Praxis      Cognition Arousal/Alertness: Awake/alert Behavior During Therapy: WFL for tasks assessed/performed Overall Cognitive Status: Impaired/Different from baseline                                          Exercises General Exercises - Upper Extremity Shoulder Flexion: Right;PROM;5 reps;Self ROM Elbow Flexion: Right;AROM;5 reps Shoulder Exercises Pendulum Exercise:  Right;Seated;Standing Other Exercises Other Exercises: lap slides, abduction/adduction Educated on functional use of R UE with daily tasks ie: using washcloth to wipe tray table   Shoulder Instructions       General Comments      Pertinent Vitals/ Pain       Pain Assessment: Faces Faces Pain Scale: Hurts worst Pain Location: "ive pretty much accepted that i will continue to be in excruciating pain" (R shoulder) Pain Descriptors / Indicators: Aching Pain Intervention(s): Limited activity within patient's tolerance  Home Living                                          Prior Functioning/Environment              Frequency           Progress Toward Goals  OT Goals(current goals can  now be found in the care plan section)  Progress towards OT goals: Progressing toward goals     Plan      Co-evaluation                 AM-PAC OT "6 Clicks" Daily Activity     Outcome Measure                    End of Session        Activity Tolerance Patient tolerated treatment well   Patient Left in bed;with call bell/phone within reach;with family/visitor present   Nurse Communication          Time: 3154-0086 OT Time Calculation (min): 23 min  Charges: OT General Charges $OT Visit: 1 Visit OT Treatments $Therapeutic Exercise: 23-37 mins   Robet Leu, COTA/L 01/01/2019, 1:39 PM

## 2019-01-02 ENCOUNTER — Encounter (HOSPITAL_COMMUNITY): Payer: Self-pay | Admitting: Orthopedic Surgery

## 2019-01-02 MED ORDER — PANTOPRAZOLE SODIUM 40 MG PO TBEC: 40.0000 mg | DELAYED_RELEASE_TABLET | Freq: Every day | ORAL | 0 refills | Status: DC

## 2019-01-02 MED ORDER — ISOSORBIDE MONONITRATE ER 30 MG PO TB24: 30.0000 mg | ORAL_TABLET | Freq: Every day | ORAL | 0 refills | Status: DC

## 2019-01-02 MED ORDER — SACUBITRIL-VALSARTAN 49-51 MG PO TABS: 1.0000 | ORAL_TABLET | Freq: Two times a day (BID) | ORAL | 0 refills | Status: DC

## 2019-01-02 MED ORDER — ADULT MULTIVITAMIN W/MINERALS CH: 1.0000 | ORAL_TABLET | Freq: Every day | ORAL | 0 refills | Status: AC

## 2019-01-02 MED ORDER — OXYCODONE HCL 5 MG PO TABS: 5.0000 mg | ORAL_TABLET | Freq: Four times a day (QID) | ORAL | 0 refills | Status: DC | PRN

## 2019-01-02 MED ORDER — ISOSORBIDE DINITRATE 10 MG PO TABS: 15.0000 mg | ORAL_TABLET | Freq: Two times a day (BID) | ORAL | 0 refills | Status: DC

## 2019-01-02 MED ORDER — VITAMIN B-6 100 MG PO TABS: 100.0000 mg | ORAL_TABLET | Freq: Every day | ORAL | 0 refills | Status: AC

## 2019-01-02 MED ORDER — CARVEDILOL 12.5 MG PO TABS: 12.5000 mg | ORAL_TABLET | Freq: Two times a day (BID) | ORAL | 0 refills | Status: DC

## 2019-01-02 MED ORDER — CEFTRIAXONE IV (FOR PTA / DISCHARGE USE ONLY): 2.0000 g | INTRAVENOUS | 0 refills | Status: DC

## 2019-01-02 MED ORDER — HEPARIN SOD (PORK) LOCK FLUSH 100 UNIT/ML IV SOLN
250.0000 [IU] | INTRAVENOUS | Status: AC | PRN
  Administered 2019-01-02: 250 [IU]

## 2019-01-02 MED ORDER — FOLIC ACID 1 MG PO TABS: 1.0000 mg | ORAL_TABLET | Freq: Every day | ORAL | 0 refills | Status: AC

## 2019-01-02 MED ORDER — THIAMINE HCL 100 MG PO TABS: 100.0000 mg | ORAL_TABLET | Freq: Every day | ORAL | 0 refills | Status: AC

## 2019-01-02 MED ORDER — GABAPENTIN 600 MG PO TABS: 300.0000 mg | ORAL_TABLET | Freq: Two times a day (BID) | ORAL | 0 refills | Status: DC

## 2019-01-02 MED ORDER — LEVETIRACETAM 1000 MG PO TABS: 1000.0000 mg | ORAL_TABLET | Freq: Two times a day (BID) | ORAL | 0 refills | Status: DC

## 2019-01-02 MED ORDER — ENSURE ENLIVE PO LIQD: 237.0000 mL | Freq: Two times a day (BID) | ORAL | 0 refills | Status: AC

## 2019-01-02 MED FILL — PANTOPRAZOLE SOD DR 40 MG T: 40 | 30 days supply | Qty: 30 | Fill #0

## 2019-01-02 MED FILL — ENTRESTO 49 MG-51 MG TABLET: 49-51 | 30 days supply | Qty: 60 | Fill #0

## 2019-01-02 MED FILL — GABAPENTIN 300 MG CAPSULE: 300 | 30 days supply | Qty: 60 | Fill #0

## 2019-01-02 MED FILL — CARVEDILOL 12.5 MG TABLET: 12.5 | 30 days supply | Qty: 60 | Fill #0

## 2019-01-02 MED FILL — levETIRAcetam 500 MG TABS: 500 | 30 days supply | Qty: 120 | Fill #0

## 2019-01-02 MED FILL — ISOSORBIDE MN ER 30 MG TAB: 30 | 30 days supply | Qty: 30 | Fill #0

## 2019-01-02 MED FILL — oxyCODONE HCL 5 MG TABS: 5 | 5 days supply | Qty: 20 | Fill #0

## 2019-01-02 NOTE — Progress Notes (Signed)
Physical Therapy Treatment Patient Details Name: Darren Diaz MRN: [id number] DOB: 07/05/1986 Today's Date: 01/02/2019    History of Present Illness Patient male who presents with seizure x8 mins secondary to ETOH withdrawal, sepsis, bacteremia and newly diagnosed systolic heart failure. Intubated 3/11-3/13. Pt on 12/30/18 had a right shoulder open excision of the biceps tendon sheath phlegmon below and around the pectoralis major insertion with arthroscopic I and D and extensive debridement with placement of Stimulan beads. 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     PT Comments    Pt performed progressed of mobility to climb two flights of stairs.  He reports new discomfort in L ankle and performed strengthening and ROM exercises to L ankle.  Pt is slow and presents with minor instability when ambulating.  Instability of L ankle observed with reciprocal pattern and patient required cues for step to sequencing.  Plan for HHPT remains appropriate for PT f/u.   Follow Up Recommendations  Home health PT     Equipment Recommendations  None recommended by PT    Recommendations for Other Services       Precautions / Restrictions Precautions Precautions: Fall Precaution Comments: pendulums, AROM/PROM of RUE  Restrictions Weight Bearing Restrictions: No    Mobility  Bed Mobility               General bed mobility comments: Pt sitting in chair on arrival.    Transfers Overall transfer level: Modified independent Equipment used: None Transfers: Sit to/from Stand Sit to Stand: Modified independent (Device/Increase time)            Ambulation/Gait Ambulation/Gait assistance: Supervision Gait Distance (Feet): 200 Feet Assistive device: None Gait Pattern/deviations: Drifts right/left;Trunk flexed     General Gait Details: Cues for safety.  NO physical assistance needed.    Stairs Stairs: Yes Stairs assistance: Supervision Stair Management: Step to  pattern;Alternating pattern;One rail Left Number of Stairs: 20 General stair comments: Cues for sequencing.     Wheelchair Mobility    Modified Rankin (Stroke Patients Only)       Balance Overall balance assessment: Needs assistance   Sitting balance-Leahy Scale: Good       Standing balance-Leahy Scale: Fair                              Cognition Arousal/Alertness: Awake/alert Behavior During Therapy: WFL for tasks assessed/performed Overall Cognitive Status: Impaired/Different from baseline Area of Impairment: Safety/judgement                 Orientation Level: Disoriented to;Place;Situation Current Attention Level: Sustained Memory: Decreased short-term memory Following Commands: Follows multi-step commands inconsistently Safety/Judgement: Decreased awareness of deficits Awareness: Emergent Problem Solving: Slow processing General Comments: Appears distracted, wanting chair unlocked and educated on why chair needed to be unlocked.        Exercises General Exercises - Upper Extremity Shoulder Flexion: AAROM;PROM;Right;Self ROM;20 reps Shoulder Extension: PROM;AAROM;Self ROM;15 reps Shoulder ABduction: PROM;AAROM;Right;15 reps Elbow Flexion: AROM;Right;10 reps Wrist Flexion: AROM;Right;10 reps Digit Composite Flexion: AROM;Right;10 reps Chair Push Up: AROM;Right;10 reps General Exercises - Lower Extremity Ankle Circles/Pumps: AROM;Both;Left;20 reps(also performed circles.  ) Heel Raises: AROM;Both;10 reps;Standing Other Exercises Other Exercises: Internal rotation x 10    General Comments        Pertinent Vitals/Pain Pain Assessment: Faces Faces Pain Scale: Hurts even more Pain Location: R shoulder  Pain Descriptors / Indicators: Discomfort Pain Intervention(s): Monitored during session;Limited activity  within patient's tolerance    Home Living                      Prior Function            PT Goals (current goals can  now be found in the care plan section) Acute Rehab PT Goals Patient Stated Goal: to reduce pain Potential to Achieve Goals: Fair Progress towards PT goals: Progressing toward goals    Frequency    Min 3X/week      PT Plan Current plan remains appropriate    Co-evaluation              AM-PAC PT "6 Clicks" Mobility   Outcome Measure  Help needed turning from your back to your side while in a flat bed without using bedrails?: None Help needed moving from lying on your back to sitting on the side of a flat bed without using bedrails?: None Help needed moving to and from a bed to a chair (including a wheelchair)?: None Help needed standing up from a chair using your arms (e.g., wheelchair or bedside chair)?: None Help needed to walk in hospital room?: A Little Help needed climbing 3-5 steps with a railing? : A Little 6 Click Score: 22    End of Session Equipment Utilized During Treatment: Gait belt Activity Tolerance: Patient tolerated treatment well Patient left: in chair;with chair alarm set;with family/visitor present Nurse Communication: Mobility status PT Visit Diagnosis: Other abnormalities of gait and mobility (R26.89);Other symptoms and signs involving the nervous system (R29.898);Pain Pain - Right/Left: Right Pain - part of body: Shoulder     Time: 5615-3794 PT Time Calculation (min) (ACUTE ONLY): 20 min  Charges:  $Gait Training: 8-22 mins                     Joycelyn Rua, PTA Acute Rehabilitation Services Pager [phone] Office [phone]     Christel Bai Artis Delay 01/02/2019, 1:10 PM

## 2019-01-02 NOTE — TOC Transition Note (Signed)
Transition of Care [facility]) - CM/SW Discharge Note   Patient Details  Name: Darren Diaz MRN: [id number] Date of Birth: 1986-10-08  [facility] CM/SW Contact:  Epifanio Lesches, RN Phone Number: 01/02/2019, 4:00 PM   Clinical Narrative:    Transition to home with home health services. Start of care 01/03/2019. Father to provide transportation.    Final next level of care: Home w Home Health Services Barriers to Discharge: Barriers Resolved   Patient Goals and CMS Choice Patient states their goals for this hospitalization and ongoing recovery are:: To go home and help myself   Choice offered to / list presented to : Patient  Discharge Placement                       Discharge Plan and Services In-house Referral: Clinical Social Work(ETOH resources) Discharge Planning Services: CM Consult              DME Agency: ([facility]) HH Arranged: PT, RN HH Agency: [facility]   Social Determinants of Health (SDOH) Interventions     Readmission Risk Interventions Readmission Risk Prevention Plan 12/30/2018  Transportation Screening Complete  PCP or Specialist Appt within 5-7 Days Complete  Home Care Screening Complete  Medication Review (RN CM) Complete  Some recent data might be hidden

## 2019-01-02 NOTE — Progress Notes (Signed)
Patient was discharged home with home health by MD order; discharged instructions review and give to patient and his father with care notes and prescriptions; PICC line on LUA in place, heparin lock; patient will be escorted to the car by nurse via wheelchair.

## 2019-01-02 NOTE — Progress Notes (Signed)
Pt bed alarm was turned on when pt was assisted into bed to go to sleep. Checked on pt while asleep around midnight and noticed that bed alarm was turned off.  Had suspicions that pt was turning off bed and chair alarm. Spoke with and questioned pt while completing morning vitals. Pt stated that he may have turned the bed alarm off once or twice. Educated pt that the bed alarm is for his safety to help prevent falls. Also told pt to use the call light so that we can assist in him when getting up due to slight unsteadiness. Pt states that he will comply. CN made aware. Will continue to monitor.

## 2019-01-02 NOTE — TOC Transition Note (Signed)
Transition of Care [facility]) - CM/SW Discharge Note   Patient Details  Name: Darren Diaz MRN: [id number] Date of Birth: 1986/09/16  Transition of Care [facility]) CM/SW Contact:  Epifanio Lesches, RN Phone Number: 01/02/2019, 11:19 AM   Clinical Narrative:   Alcohol withdrawal seizures. From home alone. Parents to assist with care once d/c. Pt will need LT IV ABX Infusion. Pt will d/c with home health services. Father to provide transportation to home.    Prentiss Bells (Father) Dayln Yust (Mother)      [phone]      Hospital f/u appointment scheduled :  [facility]   [phone] [address] [place] [address]    Next Steps: Follow up on 01/03/2019    Instructions: 10:10 am, Dr.Zelda FLemings       Final next level of care: Home w Home Health Services Barriers to Discharge: Barriers Resolved   Patient Goals and CMS Choice Patient states their goals for this hospitalization and ongoing recovery are:: To go home and help myself   Choice offered to / list presented to : Patient   Discharge Plan and Services In-house Referral: Clinical Social Work(ETOH resources), Substance abuse resource brochure given to pt  Discharge Planning Services: CM consult              DME Agency: ([facility]) HH Arranged: PT, RN HH Agency: [facility]   Social Determinants of Health (SDOH) Interventions     Readmission Risk Interventions Readmission Risk Prevention Plan 12/30/2018  Transportation Screening Complete  PCP or Specialist Appt within 5-7 Days Complete  Home Care Screening Complete  Medication Review (RN CM) Complete  Some recent data might be hidden

## 2019-01-02 NOTE — Progress Notes (Signed)
In the beginning of this shift patient was educated to not remove the bed alarm because it is for his safety to help prevent falls. Also told pt to use the call light so that we can assist in him when getting up due to slight unsteadiness. Pt states that he will comply. CN made aware. Will continue to monitor.

## 2019-01-02 NOTE — Progress Notes (Signed)
Occupational Therapy Treatment Patient Details Name: Darren Diaz MRN: [id number] DOB: 05/01/1986 Today's Date: 01/02/2019    History of present illness Patient male who presents with seizure x8 mins secondary to ETOH withdrawal, sepsis, bacteremia and newly diagnosed systolic heart failure. Intubated 3/11-3/13. Pt on 12/30/18 had a right shoulder open excision of the biceps tendon sheath phlegmon below and around the pectoralis major insertion with arthroscopic I and D and extensive debridement with placement of Stimulan beads. PMH includes ETOH use, PDA with closure at age 62.    OT comments  Focus of session on RUE A/AA/PROM and ADL retraining regarding compensatory strategies. Contineu to recommend HHOT.   Follow Up Recommendations  Home health OT;Supervision/Assistance - 24 hour    Equipment Recommendations  Tub/shower seat    Recommendations for Other Services      Precautions / Restrictions Precautions Precautions: Fall Restrictions Weight Bearing Restrictions: No       Mobility Bed Mobility               General bed mobility comments: up in recliner  Transfers                      Balance                                           ADL either performed or assessed with clinical judgement   ADL Overall ADL's : Needs assistance/impaired                                     Functional mobility during ADLs: Min guard General ADL Comments: Reviewed compensatory techniques for dressing/bathing. encouraged to use RUE for function     Vision       Perception     Praxis      Cognition Arousal/Alertness: Awake/alert Behavior During Therapy: WFL for tasks assessed/performed Overall Cognitive Status: Impaired/Different from baseline Area of Impairment: Safety/judgement                 Orientation Level: Disoriented to;Place;Situation Current Attention Level: Sustained Memory: Decreased short-term  memory Following Commands: Follows multi-step commands inconsistently Safety/Judgement: Decreased awareness of deficits Awareness: Emergent Problem Solving: Slow processing          Exercises Exercises: General Upper Extremity General Exercises - Upper Extremity Shoulder Flexion: AAROM;PROM;Right;Self ROM;20 reps Shoulder Extension: PROM;AAROM;Self ROM;15 reps Shoulder ABduction: PROM;AAROM;Right;15 reps Elbow Flexion: AROM;Right;10 reps Wrist Flexion: AROM;Right;10 reps Digit Composite Flexion: AROM;Right;10 reps Chair Push Up: AROM;Right;10 reps Other Exercises Other Exercises: Internal rotation x 10   Shoulder Instructions       General Comments      Pertinent Vitals/ Pain       Pain Assessment: Faces Faces Pain Scale: Hurts even more Pain Location: R shoulder with ROM Pain Descriptors / Indicators: Aching;Discomfort;Grimacing Pain Intervention(s): Limited activity within patient's tolerance  Home Living                                          Prior Functioning/Environment              Frequency  Min 3X/week        Progress Toward Goals  OT Goals(current goals can  now be found in the care plan section)  Progress towards OT goals: Progressing toward goals  Acute Rehab OT Goals Patient Stated Goal: to reduce pain OT Goal Formulation: With patient Time For Goal Achievement: 01/07/19 Potential to Achieve Goals: Good ADL Goals Pt Will Perform Upper Body Bathing: with modified independence;sitting Pt Will Perform Tub/Shower Transfer: with modified independence;ambulating Pt/caregiver will Perform Home Exercise Program: Right Upper extremity;With written HEP provided  Plan Discharge plan remains appropriate;Frequency needs to be updated    Co-evaluation                 AM-PAC OT "6 Clicks" Daily Activity     Outcome Measure   Help from another person eating meals?: None Help from another person taking care of personal  grooming?: A Little Help from another person toileting, which includes using toliet, bedpan, or urinal?: A Little Help from another person bathing (including washing, rinsing, drying)?: A Little Help from another person to put on and taking off regular upper body clothing?: A Little Help from another person to put on and taking off regular lower body clothing?: A Little 6 Click Score: 19    End of Session    OT Visit Diagnosis: Unsteadiness on feet (R26.81);Repeated falls (R29.6);History of falling (Z91.81);Pain Pain - Right/Left: Right Pain - part of body: Shoulder   Activity Tolerance Patient tolerated treatment well   Patient Left in chair;with call bell/phone within reach;with chair alarm set   Nurse Communication Mobility status        Time: 1030-1055 OT Time Calculation (min): 25 min  Charges: OT General Charges $OT Visit: 1 Visit OT Treatments $Self Care/Home Management : 8-22 mins $Therapeutic Exercise: 8-22 mins  Luisa Dago, OT/L   Acute OT Clinical Specialist Acute Rehabilitation Services Pager [phone] Office [phone]    [facility] 01/02/2019, 11:15 AM

## 2019-01-02 NOTE — Discharge Summary (Addendum)
PATIENT DETAILS Name: Darren Diaz Age: 33 y.o. Sex: male Date of Birth: 09-22-1986 MRN: [id number]. Admitting Physician: Etta Quill, DO EHO:[id number], No Pcp Per  Admit Date: 12/17/2018 Discharge date: 01/02/2019  Recommendations for Outpatient Follow-up:  1. Follow up with PCP in 1-2 weeks 2. Please obtain BMP/CBC in one week  Admitted From:  Home  Disposition: [place]:  Yes  Equipment/Devices: 3/22>> PICC line  Discharge Condition: Stable  CODE STATUS: FULL CODE  Diet recommendation:  Heart Healthy  Brief Summary: See H&P, Labs, Consult and Test reports for all details in brief, Patient is a 33 y.o. male with history of alcohol use, seizure disorder-presenting with breakthrough seizure, fever-found to have sepsis pathophysiology secondary to Streptococcus pyogenes bacteremia and right lower extremity cellulitis.  Unfortunately, patient continued to deteriorate, and developed delirium tremens, and acute hypoxic respiratory failure-he was intubated and managed by CCM in the ICU.  Subsequently improved, was extubated on 3/13, and transferred to [facility] on 3/14.  Further hospital course complicated by development of right shoulder septic arthritis requiring incision and drainage.  See below for further details  Brief Hospital Course: Sepsis secondary to Streptococcus pyogenes bacteremia with right lower extremity cellulitis and right shoulder septic arthritis: Sepsis pathophysiology has resolved, TTE negative for endocarditis.  Initially on pen G-transitioned to oral amoxicillin-but on 3/19 underwent MRI of the right shoulder due to worsening pain and was found to have septic arthritis.  Underwent I&D on 3/20.  Plans are to continue IV Rocephin so for at least 6 weeks on discharge, PICC line placed on 3/22.  Patient will require outpatient follow-up with orthopedics, and infectious disease.  See below for lab requirements while on IV Rocephin.  Right shoulder  pain: Secondary to septic arthritis of the right shoulder.  See above.  Right upper extremity Doppler was negative for DVT on 3/15.  Acute hypoxic respiratory failure: Secondary to sepsis, worsening DTs-intubated on 3/11-and extubated on 3/13.  Currently on room air.    Acute metabolic encephalopathy: Resolved.  Secondary to alcohol withdrawal/DTs and sepsis.  CT head negative for acute abnormalities, EEG without any seizures.  Currently completely awake and alert.    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  Continue Coreg, Entresto and Imdur.  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  Patient has been counseled extensively regarding importance of abstaining from alcohol use.  Patient is to follow with cardiology as instructed.  Hypertension: Controlled-continue Coreg, Entresto and Imdur.  Alcohol withdrawal with DTs: Required transfer to ICU-intubation and mechanical ventilation-required Precedex infusion while in the ICU.  Withdrawal symptoms have resolved  Seizure disorder: Continue Keppra-EEG negative for seizures.    Transaminitis: Resolved.  Likely secondary to sepsis/shock liver, AST/ALT downtrending.  RUQ ultrasound unremarkable.   AKI: Suspect hemodynamically mediated-improving with supportive care.    Procedures/Studies: See above  Discharge Diagnoses:  Principal Problem:   Bacterial infection due to streptococcus, group A Active Problems:   Acute respiratory failure (HCC)   Alcohol withdrawal seizure, with unspecified complication (HCC)   Nonischemic cardiomyopathy (HCC)   Pressure injury of skin   Septic arthritis of shoulder, right (HCC)   AKI (acute kidney injury) ([place])   Acute combined systolic and diastolic heart failure [facility])   Discharge Instructions:  Activity:  As tolerated with Full fall precautions use walker/cane & assistance as  needed   Discharge Instructions    (HEART FAILURE PATIENTS) Call MD:  Anytime you have any of the following symptoms: 1) 3 pound weight gain in 24 hours or 5 pounds in 1 week 2) shortness of breath, with or without a dry hacking cough 3) swelling in the hands, feet or stomach 4) if you have to sleep on extra pillows at night in order to breathe.   Complete by:  As directed    Diet - low sodium heart healthy   Complete by:  As directed    Discharge instructions   Complete by:  As directed    Follow with Primary MD in 1 week  STOP SMOKING  STOP DRINKING ALCOHOL  Please get a complete blood count and chemistry panel checked by your Primary MD at your next visit, and again as instructed by your Primary MD.  Get Medicines reviewed and adjusted: Please take all your medications with you for your next visit with your Primary MD  Laboratory/radiological data: Please request your Primary MD to go over all hospital tests and procedure/radiological results at the follow up, please ask your Primary MD to get all Hospital records sent to his/her office.  In some cases, they will be blood work, cultures and biopsy results pending at the time of your discharge. Please request that your primary care M.D. follows up on these results.  Also Note the following: If you experience worsening of your admission symptoms, develop shortness of breath, life threatening emergency, suicidal or homicidal thoughts you must seek medical attention immediately by calling 911 or calling your MD immediately  if symptoms less severe.  You must read complete instructions/literature along with all the possible adverse reactions/side effects for all the Medicines you take and that have been prescribed to you. Take any new Medicines after you have completely understood and accpet all the possible adverse reactions/side effects.   Do not drive when taking Pain medications or sleeping medications (Benzodaizepines)  Do not  take more than prescribed Pain, Sleep and Anxiety Medications. It is not advisable to combine anxiety,sleep and pain medications without talking with your primary care practitioner  Special Instructions: If you have smoked or chewed Tobacco  in the last 2 yrs please stop smoking, stop any regular Alcohol  and or any Recreational drug use.  Wear Seat belts while driving.  Please note: You were cared for by a hospitalist during your hospital stay. Once you are discharged, your primary care physician will handle any further medical issues. Please note that NO REFILLS for any discharge medications will be authorized once you are discharged, as it is imperative that you return to your primary care physician (or establish a relationship with a primary care physician if you do not have one) for your post hospital discharge needs so that they can reassess your need for medications and monitor your lab values.   Home infusion instructions [facility] May follow [place] Dosing Protocol; May administer Cathflo as needed to maintain patency of vascular access device.; Flushing of vascular access device: per [facility] Protocol: 0.9% NaCl pre/post medica...   Complete by:  As directed    Instructions:  May follow [place] Dosing Protocol   Instructions:  May administer Cathflo as needed to maintain patency of vascular access device.   Instructions:  Flushing of vascular access device: per [facility] Protocol: 0.9% NaCl pre/post medication administration and prn patency; Heparin 100 u/ml, 54m for implanted ports and Heparin 10u/ml, 564mfor all other central venous catheters.   Instructions:  May follow [facility] Anaphylaxis Protocol for First  Dose Administration in the home: 0.9% NaCl at 25-50 ml/hr to maintain IV access for protocol meds. Epinephrine 0.3 ml IV/IM PRN and Benadryl 25-50 IV/IM PRN s/s of anaphylaxis.   Instructions:  [place] Infusion Coordinator (RN) to assist per patient IV care needs in the  home PRN.   Increase activity slowly   Complete by:  As directed      Allergies as of 01/02/2019   No Known Allergies     Medication List    STOP taking these medications   cloNIDine 0.1 MG tablet Commonly known as:  CATAPRES   QUEtiapine 50 MG tablet Commonly known as:  SEROQUEL     TAKE these medications   carvedilol 12.5 MG tablet Commonly known as:  COREG Take 1 tablet (12.5 mg total) by mouth 2 (two) times daily with a meal for 30 days.   cefTRIAXone  IVPB Commonly known as:  ROCEPHIN Inject 2 g into the vein daily. Indication:  Group A Strep Bacteremia/Osteo Last Day of Therapy:  02/10/2019 Labs - Once weekly:  CBC/D and BMP, Labs - Every other week:  ESR and CRP   feeding supplement (ENSURE ENLIVE) Liqd Take 237 mLs by mouth 2 (two) times daily between meals for 30 days.   folic acid 1 MG tablet Commonly known as:  FOLVITE Take 1 tablet (1 mg total) by mouth daily for 30 days.   gabapentin 600 MG tablet Commonly known as:  NEURONTIN Take 0.5 tablets (300 mg total) by mouth 2 (two) times daily.   isosorbide mononitrate 30 MG 24 hr tablet Commonly known as:  IMDUR Take 1 tablet (30 mg total) by mouth daily for 30 days.   levETIRAcetam 1000 MG tablet Commonly known as:  KEPPRA Take 1 tablet (1,000 mg total) by mouth 2 (two) times daily for 30 days.   multivitamin with minerals Tabs tablet Take 1 tablet by mouth daily for 30 days.   oxyCODONE 5 MG immediate release tablet Commonly known as:  Oxy IR/ROXICODONE Take 1 tablet (5 mg total) by mouth every 6 (six) hours as needed for severe pain.   pantoprazole 40 MG tablet Commonly known as:  PROTONIX Take 1 tablet (40 mg total) by mouth daily. Start taking on:  January 03, 2019   pyridOXINE 100 MG tablet Commonly known as:  VITAMIN B-6 Take 1 tablet (100 mg total) by mouth daily.   sacubitril-valsartan 49-51 MG Commonly known as:  ENTRESTO Take 1 tablet by mouth 2 (two) times daily.   thiamine 100 MG  tablet Take 1 tablet (100 mg total) by mouth daily for 30 days. Start taking on:  January 03, 2019            Home Infusion Instuctions  (From admission, onward)         Start     Ordered   01/02/19 0000  Home infusion instructions [facility] May follow [place] Dosing Protocol; May administer Cathflo as needed to maintain patency of vascular access device.; Flushing of vascular access device: per [facility] Protocol: 0.9% NaCl pre/post medica...    Question Answer Comment  Instructions May follow [place] Dosing Protocol   Instructions May administer Cathflo as needed to maintain patency of vascular access device.   Instructions Flushing of vascular access device: per [facility] Protocol: 0.9% NaCl pre/post medication administration and prn patency; Heparin 100 u/ml, 67m for implanted ports and Heparin 10u/ml, 539mfor all other central venous catheters.   Instructions May follow [facility] Anaphylaxis  Protocol for First Dose Administration in the home: 0.9% NaCl at 25-50 ml/hr to maintain IV access for protocol meds. Epinephrine 0.3 ml IV/IM PRN and Benadryl 25-50 IV/IM PRN s/s of anaphylaxis.   Instructions [facility] Infusion Coordinator (RN) to assist per patient IV care needs in the home PRN.      01/02/19 1022         Follow-up Information    Lorretta Harp, MD Follow up.   Specialties:  Cardiology, Radiology Why:  office scheduler will contact to arrange followup. Please give [place] a call if you do not hear from [place] in 3 business days.  Contact information: [address] [place] [address] [phone]        [facility] Follow up.   Specialty:  Infectious Diseases Why:  OFFICE WILL CALL YOU FOR A FOLLOW UP APPT-IF YOU DO NOT HEAR FROM [facility] GIVE THEM A CALL Contact information: [place], [place] [id number] [place] [phone]       Primary Care MD.  Schedule an appointment as soon as possible for a visit in 1 week(s).        [facility] Follow up on 01/03/2019.   Why:  10:10 am , Dr.Zelda FLemings Contact information: [address] [place] [address] [phone]         No Known Allergies  Consultations:   cardiology, pulmonary/intensive care, ID and orthopedic surgery   Other Procedures/Studies: Dg Chest 1 View  Result Date: 12/26/2018 CLINICAL DATA:  Fever.  Extubated. EXAM: CHEST  1 VIEW COMPARISON:  12/23/2018 FINDINGS: Endotracheal tube and nasogastric tube have been removed. Improved but mild and persistent infiltrate/atelectasis in the lower lobes left more than right. No worsening or new finding. IMPRESSION: Endotracheal tube and nasogastric tube removed. Better aeration of the lower lungs. Mild persistent atelectasis/infiltrate in the lower lobes, left more than right. Electronically Signed   By: Nelson Chimes M.D.   On: 12/26/2018 10:43   Dg Chest 2 View  Result Date: 12/17/2018 CLINICAL DATA:  Seizure, fever EXAM: CHEST - 2 VIEW COMPARISON:  09/09/2018 chest radiograph. FINDINGS: Stable cardiomediastinal silhouette with normal heart size. No pneumothorax. No pleural effusion. Lungs appear clear, with no acute consolidative airspace disease and no pulmonary edema. IMPRESSION: No active cardiopulmonary disease. Electronically Signed   By: Ilona Sorrel M.D.   On: 12/17/2018 20:47   Dg Shoulder Right  Result Date: 12/20/2018 CLINICAL DATA:  Right shoulder pain. EXAM: RIGHT SHOULDER - 2+ VIEW COMPARISON:  None. FINDINGS: There is no evidence of fracture or dislocation. There is no evidence of arthropathy or other focal bone abnormality. Soft tissues are unremarkable. IMPRESSION: Negative. Electronically Signed   By: Fidela Salisbury M.D.   On: 12/20/2018 15:59   Ct Head Wo Contrast  Result Date: 12/17/2018 CLINICAL DATA:  Seizure, head trauma EXAM: CT HEAD WITHOUT  CONTRAST TECHNIQUE: Contiguous axial images were obtained from the base of the skull through the vertex without intravenous contrast. COMPARISON:  MRI 09/18/2018 FINDINGS: Brain: No acute intracranial abnormality. Specifically, no hemorrhage, hydrocephalus, mass lesion, acute infarction, or significant intracranial injury. Vascular: No hyperdense vessel or unexpected calcification. Skull: No acute calvarial abnormality. Sinuses/Orbits: Visualized paranasal sinuses and mastoids clear. Orbital soft tissues unremarkable. Other: None IMPRESSION: No acute intracranial abnormality. Electronically Signed   By: Rolm Baptise M.D.   On: 12/17/2018 20:08   US Renal  Result Date:  12/21/2018 CLINICAL DATA:  Acute kidney injury EXAM: RENAL / URINARY TRACT ULTRASOUND COMPLETE COMPARISON:  None. FINDINGS: Right Kidney: Renal measurements: 13.8 x 5.8 x 6.3 cm = volume: 268.4 mL . Echogenicity within normal limits. No mass or hydronephrosis visualized. Trace right perinephric fluid. Left Kidney: Renal measurements: 14.3 x 6.4 x 7.6 cm = volume: 371.8 mL. Echogenicity within normal limits. No mass or hydronephrosis visualized. Bladder: Appears normal for degree of bladder distention. IMPRESSION: Negative renal ultrasound Electronically Signed   By: Donavan Foil M.D.   On: 12/21/2018 01:17   Mr Shoulder Right W Wo Contrast  Result Date: 12/29/2018 CLINICAL DATA:  Fever and sepsis. Strep pyogenes bacteremia. Right lower extremity cellulitis. Right shoulder swelling and pain. Question infection. EXAM: MRI OF THE RIGHT SHOULDER WITHOUT AND WITH CONTRAST TECHNIQUE: Multiplanar, multisequence MR imaging of the right shoulder was performed before and after the administration of intravenous contrast. CONTRAST:  10 cc Gadavist IV. COMPARISON:  None. FINDINGS: Rotator cuff: There is some thickening and heterogeneously increased T2 signal in the rotator cuff tendons consistent with tendinopathy. No tear. Muscles: Edema and mild  enhancement are seen in the posterior subscapularis and infraspinatus muscle bellies. Biceps long head: Intact. There is fluid in the tendon sheath of the long head of biceps. A rim enhancing fluid collection along the medial margin of the biceps tendon distal to the bicipital groove measures up to 1.2 cm AP by 1.2 cm transverse. The collection extends below the inferior margin of the scan and may involve the biceps muscle. Acromioclavicular Joint: Mild degenerative change is present. Type 1 acromion. Only a small amount of fluid is seen in the subacromial/subdeltoid bursa. Glenohumeral Joint: The patient has a moderately large glenohumeral joint effusion with marked synovial thickening and enhancement. Labrum:  The superior labrum is severely degenerated. Bones: There is marrow edema and enhancement in the posterior aspect of the humeral head. Subchondral edema is seen in the anterior, inferior glenoid. No fracture. Other: None. IMPRESSION: Large glenohumeral joint effusion with intense synovial enhancement and thickening highly suspicious for septic glenohumeral joint. Marrow edema in the posterior aspect of the humeral head could be related to rotator cuff tendinopathy but may also reflect osteomyelitis. Rim enhancing fluid collection along the medial margin of the biceps tendon distal to the bicipital groove extends below the inferior margin of the study and is most consistent with abscess. Edema and enhancement within the muscle belly of the posterior infraspinatus and anterior subscapularis are consistent with myositis. Electronically Signed   By: Inge Rise M.D.   On: 12/29/2018 15:33   Dg Chest Port 1 View  Result Date: 12/23/2018 CLINICAL DATA:  Endotracheal tube EXAM: PORTABLE CHEST 1 VIEW COMPARISON:  Yesterday FINDINGS: Endotracheal tube tip is just below the clavicular heads, improved. Orogastric tube at least reaches the stomach. Cardiopericardial enlargement and lower lobe opacities with  left-sided air bronchogram. Layering pleural fluid on the right at least. No pneumothorax is seen. IMPRESSION: 1. Improved endotracheal tube positioning. 2. Atelectasis or pneumonia at the bases with layering pleural fluid. Electronically Signed   By: Monte Fantasia M.D.   On: 12/23/2018 06:43   Dg Chest Port 1 View  Result Date: 12/22/2018 CLINICAL DATA:  Shortness of breath. EXAM: PORTABLE CHEST 1 VIEW COMPARISON:  12/21/2018 FINDINGS: Endotracheal tube 9.6 cm above the carina. Advancement with approximately 5 cm may be considered. Enteric catheter tip collimated off the image. Cardiomediastinal silhouette is enlarged. Mediastinal contours appear intact. There is no evidence of pneumothorax. Bilateral  pleural effusions with lower lobe atelectasis versus airspace consolidation. Interstitial pulmonary edema. Osseous structures are without acute abnormality. Soft tissues are grossly normal. IMPRESSION: 1. Enlarged cardiac silhouette. 2. Interstitial pulmonary edema. 3. Bilateral pleural effusions with lower lobe atelectasis versus airspace consolidation. 4. Endotracheal tube may be advanced by 5 cm. Electronically Signed   By: Fidela Salisbury M.D.   On: 12/22/2018 09:01   Portable Chest X-ray  Result Date: 12/21/2018 CLINICAL DATA:  Endotracheal tube EXAM: PORTABLE CHEST 1 VIEW COMPARISON:  Earlier today FINDINGS: Endotracheal tube tip at the clavicular heads. Orogastric tube with tip reaching the stomach. Interstitial opacity with now apparent bilateral layering pleural effusions. No pneumothorax. Normal heart size. 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 IMPRESSION: 1. New hardware in unremarkable position. 2. Pulmonary edema and layering pleural effusions. Electronically Signed   By: Monte Fantasia M.D.   On: 12/21/2018 09:08   Dg Chest Port 1v Same Day  Result Date: 12/21/2018 CLINICAL DATA:  Shortness of breath EXAM: PORTABLE CHEST 1 VIEW COMPARISON:  Three days ago FINDINGS:  Diffuse interstitial opacity that is symmetric. Perihilar airspace opacity. Borderline heart size accentuated by technique. No effusion or pneumothorax IMPRESSION: Pulmonary edema. Electronically Signed   By: Monte Fantasia M.D.   On: 12/21/2018 08:28   Dg Chest Port 1v Same Day  Result Date: 12/18/2018 CLINICAL DATA:  Shortness of breath EXAM: PORTABLE CHEST 1 VIEW COMPARISON:  December 17, 2018 FINDINGS: The heart size and mediastinal contours are within normal limits. Both lungs are clear. The visualized skeletal structures are unremarkable. IMPRESSION: No active disease. Electronically Signed   By: Dorise Bullion III M.D   On: 12/18/2018 16:01   Vas [place] Upper Extremity Venous Duplex  Result Date: 12/25/2018 UPPER VENOUS STUDY  Indications: Swelling Limitations: Patient positioning, wrist restraints. Performing Technologist: Oliver Hum RVT  Examination Guidelines: A complete evaluation includes B-mode imaging, spectral Doppler, color Doppler, and power Doppler as needed of all accessible portions of each vessel. Bilateral testing is considered an integral part of a complete examination. Limited examinations for reoccurring indications may be performed as noted.  Right Findings: +----------+------------+---------+-----------+----------+--------------+  RIGHT      Compressible Phasicity Spontaneous Properties    Summary      +----------+------------+---------+-----------+----------+--------------+  IJV            Full        Yes        Yes                                +----------+------------+---------+-----------+----------+--------------+  Subclavian     Full        Yes        Yes                                +----------+------------+---------+-----------+----------+--------------+  Axillary       Full        Yes        Yes                                +----------+------------+---------+-----------+----------+--------------+  Brachial       Full        Yes        Yes                                 +----------+------------+---------+-----------+----------+--------------+  Radial         Full                                                      +----------+------------+---------+-----------+----------+--------------+  Ulnar                                                    Not visualized  +----------+------------+---------+-----------+----------+--------------+  Cephalic       Full                                                      +----------+------------+---------+-----------+----------+--------------+  Basilic        Full                                                      +----------+------------+---------+-----------+----------+--------------+  Left Findings: +----------+------------+---------+-----------+----------+-------+  LEFT       Compressible Phasicity Spontaneous Properties Summary  +----------+------------+---------+-----------+----------+-------+  Subclavian     Full        Yes        Yes                         +----------+------------+---------+-----------+----------+-------+  Summary:  Right: No evidence of deep vein thrombosis in the upper extremity. No evidence of superficial vein thrombosis in the upper extremity.  Left: No evidence of thrombosis in the subclavian.  *See table(s) above for measurements and observations.    Preliminary    [place] Ekg Site Rite  Result Date: 12/30/2018 [facility] image not attached, placement could not be confirmed due to current cardiac rhythm.  US Abdomen Limited Ruq  Result Date: 12/19/2018 CLINICAL DATA:  Elevated LFTs EXAM: ULTRASOUND ABDOMEN LIMITED RIGHT UPPER QUADRANT COMPARISON:  None. FINDINGS: Gallbladder: Evaluation of the gallbladder is limited. There appears to be a thickened wall measuring up to 5.5 mm. No stones, pericholecystic fluid, or Murphy's sign identified. Common bile duct: Diameter: 4.0 mm Liver: No focal lesion identified. Within normal limits in parenchymal echogenicity. Portal vein is patent on color Doppler imaging with  normal direction of blood flow towards the liver. IMPRESSION: 1. The study is limited due to patient condition and difficulty following instructions. The gallbladder is not well assessed as it is contracted. The wall thickening to 5.5 mm could be due to lack of distention. No stones, pericholecystic fluid, or Murphy's sign identified. If there is concern for acute cholecystitis, recommend a HIDA scan. 2. No other abnormalities. Electronically Signed   By: Dorise Bullion III M.D   On: 12/19/2018 13:54     TODAY-DAY OF DISCHARGE:  Subjective:   L[place] today has no headache,no chest abdominal pain,no new weakness tingling or numbness, feels much better wants to go home today.   Objective:   Blood pressure 124/75, pulse 72, temperature 99.5 F (37.5 C), temperature source Oral,  resp. rate 18, height '5\' 11"'  (1.803 m), weight 87.6 kg, SpO2 99 %.  Intake/Output Summary (Last 24 hours) at 01/02/2019 1412 Last data filed at 01/02/2019 1400 Gross per 24 hour  Intake 430 ml  Output 3700 ml  Net -3270 ml   Filed Weights   12/30/18 1153 01/01/19 0020 01/02/19 0500  Weight: 89.8 kg 89.9 kg 87.6 kg    Exam: Awake Alert, Oriented *3, No new F.N deficits, Normal affect [place].AT,PERRAL Supple Neck,No JVD, No cervical lymphadenopathy appriciated.  Symmetrical Chest wall movement, Good air movement bilaterally, CTAB RRR,No Gallops,Rubs or new Murmurs, No Parasternal Heave +ve B.Sounds, Abd Soft, Non tender, No organomegaly appriciated, No rebound -guarding or rigidity. No Cyanosis, Clubbing or edema, No new Rash or bruise   PERTINENT RADIOLOGIC STUDIES: Dg Chest 1 View  Result Date: 12/26/2018 CLINICAL DATA:  Fever.  Extubated. EXAM: CHEST  1 VIEW COMPARISON:  12/23/2018 FINDINGS: Endotracheal tube and nasogastric tube have been removed. Improved but mild and persistent infiltrate/atelectasis in the lower lobes left more than right. No worsening or new finding. IMPRESSION: Endotracheal tube and  nasogastric tube removed. Better aeration of the lower lungs. Mild persistent atelectasis/infiltrate in the lower lobes, left more than right. Electronically Signed   By: Nelson Chimes M.D.   On: 12/26/2018 10:43   Dg Chest 2 View  Result Date: 12/17/2018 CLINICAL DATA:  Seizure, fever EXAM: CHEST - 2 VIEW COMPARISON:  09/09/2018 chest radiograph. FINDINGS: Stable cardiomediastinal silhouette with normal heart size. No pneumothorax. No pleural effusion. Lungs appear clear, with no acute consolidative airspace disease and no pulmonary edema. IMPRESSION: No active cardiopulmonary disease. Electronically Signed   By: Ilona Sorrel M.D.   On: 12/17/2018 20:47   Dg Shoulder Right  Result Date: 12/20/2018 CLINICAL DATA:  Right shoulder pain. EXAM: RIGHT SHOULDER - 2+ VIEW COMPARISON:  None. FINDINGS: There is no evidence of fracture or dislocation. There is no evidence of arthropathy or other focal bone abnormality. Soft tissues are unremarkable. IMPRESSION: Negative. Electronically Signed   By: Fidela Salisbury M.D.   On: 12/20/2018 15:59   Ct Head Wo Contrast  Result Date: 12/17/2018 CLINICAL DATA:  Seizure, head trauma EXAM: CT HEAD WITHOUT CONTRAST TECHNIQUE: Contiguous axial images were obtained from the base of the skull through the vertex without intravenous contrast. COMPARISON:  MRI 09/18/2018 FINDINGS: Brain: No acute intracranial abnormality. Specifically, no hemorrhage, hydrocephalus, mass lesion, acute infarction, or significant intracranial injury. Vascular: No hyperdense vessel or unexpected calcification. Skull: No acute calvarial abnormality. Sinuses/Orbits: Visualized paranasal sinuses and mastoids clear. Orbital soft tissues unremarkable. Other: None IMPRESSION: No acute intracranial abnormality. Electronically Signed   By: Rolm Baptise M.D.   On: 12/17/2018 20:08   US Renal  Result Date: 12/21/2018 CLINICAL DATA:  Acute kidney injury EXAM: RENAL / URINARY TRACT ULTRASOUND COMPLETE  COMPARISON:  None. FINDINGS: Right Kidney: Renal measurements: 13.8 x 5.8 x 6.3 cm = volume: 268.4 mL . Echogenicity within normal limits. No mass or hydronephrosis visualized. Trace right perinephric fluid. Left Kidney: Renal measurements: 14.3 x 6.4 x 7.6 cm = volume: 371.8 mL. Echogenicity within normal limits. No mass or hydronephrosis visualized. Bladder: Appears normal for degree of bladder distention. IMPRESSION: Negative renal ultrasound Electronically Signed   By: Donavan Foil M.D.   On: 12/21/2018 01:17   Mr Shoulder Right W Wo Contrast  Result Date: 12/29/2018 CLINICAL DATA:  Fever and sepsis. Strep pyogenes bacteremia. Right lower extremity cellulitis. Right shoulder swelling and pain. Question infection. EXAM: MRI OF  THE RIGHT SHOULDER WITHOUT AND WITH CONTRAST TECHNIQUE: Multiplanar, multisequence MR imaging of the right shoulder was performed before and after the administration of intravenous contrast. CONTRAST:  10 cc Gadavist IV. COMPARISON:  None. FINDINGS: Rotator cuff: There is some thickening and heterogeneously increased T2 signal in the rotator cuff tendons consistent with tendinopathy. No tear. Muscles: Edema and mild enhancement are seen in the posterior subscapularis and infraspinatus muscle bellies. Biceps long head: Intact. There is fluid in the tendon sheath of the long head of biceps. A rim enhancing fluid collection along the medial margin of the biceps tendon distal to the bicipital groove measures up to 1.2 cm AP by 1.2 cm transverse. The collection extends below the inferior margin of the scan and may involve the biceps muscle. Acromioclavicular Joint: Mild degenerative change is present. Type 1 acromion. Only a small amount of fluid is seen in the subacromial/subdeltoid bursa. Glenohumeral Joint: The patient has a moderately large glenohumeral joint effusion with marked synovial thickening and enhancement. Labrum:  The superior labrum is severely degenerated. Bones: There is  marrow edema and enhancement in the posterior aspect of the humeral head. Subchondral edema is seen in the anterior, inferior glenoid. No fracture. Other: None. IMPRESSION: Large glenohumeral joint effusion with intense synovial enhancement and thickening highly suspicious for septic glenohumeral joint. Marrow edema in the posterior aspect of the humeral head could be related to rotator cuff tendinopathy but may also reflect osteomyelitis. Rim enhancing fluid collection along the medial margin of the biceps tendon distal to the bicipital groove extends below the inferior margin of the study and is most consistent with abscess. Edema and enhancement within the muscle belly of the posterior infraspinatus and anterior subscapularis are consistent with myositis. Electronically Signed   By: Inge Rise M.D.   On: 12/29/2018 15:33   Dg Chest Port 1 View  Result Date: 12/23/2018 CLINICAL DATA:  Endotracheal tube EXAM: PORTABLE CHEST 1 VIEW COMPARISON:  Yesterday FINDINGS: Endotracheal tube tip is just below the clavicular heads, improved. Orogastric tube at least reaches the stomach. Cardiopericardial enlargement and lower lobe opacities with left-sided air bronchogram. Layering pleural fluid on the right at least. No pneumothorax is seen. IMPRESSION: 1. Improved endotracheal tube positioning. 2. Atelectasis or pneumonia at the bases with layering pleural fluid. Electronically Signed   By: Monte Fantasia M.D.   On: 12/23/2018 06:43   Dg Chest Port 1 View  Result Date: 12/22/2018 CLINICAL DATA:  Shortness of breath. EXAM: PORTABLE CHEST 1 VIEW COMPARISON:  12/21/2018 FINDINGS: Endotracheal tube 9.6 cm above the carina. Advancement with approximately 5 cm may be considered. Enteric catheter tip collimated off the image. Cardiomediastinal silhouette is enlarged. Mediastinal contours appear intact. There is no evidence of pneumothorax. Bilateral pleural effusions with lower lobe atelectasis versus airspace  consolidation. Interstitial pulmonary edema. Osseous structures are without acute abnormality. Soft tissues are grossly normal. IMPRESSION: 1. Enlarged cardiac silhouette. 2. Interstitial pulmonary edema. 3. Bilateral pleural effusions with lower lobe atelectasis versus airspace consolidation. 4. Endotracheal tube may be advanced by 5 cm. Electronically Signed   By: Fidela Salisbury M.D.   On: 12/22/2018 09:01   Portable Chest X-ray  Result Date: 12/21/2018 CLINICAL DATA:  Endotracheal tube EXAM: PORTABLE CHEST 1 VIEW COMPARISON:  Earlier today FINDINGS: Endotracheal tube tip at the clavicular heads. Orogastric tube with tip reaching the stomach. Interstitial opacity with now apparent bilateral layering pleural effusions. No pneumothorax. Normal heart size. 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 IMPRESSION: 1. New hardware  in unremarkable position. 2. Pulmonary edema and layering pleural effusions. Electronically Signed   By: Monte Fantasia M.D.   On: 12/21/2018 09:08   Dg Chest Port 1v Same Day  Result Date: 12/21/2018 CLINICAL DATA:  Shortness of breath EXAM: PORTABLE CHEST 1 VIEW COMPARISON:  Three days ago FINDINGS: Diffuse interstitial opacity that is symmetric. Perihilar airspace opacity. Borderline heart size accentuated by technique. No effusion or pneumothorax IMPRESSION: Pulmonary edema. Electronically Signed   By: Monte Fantasia M.D.   On: 12/21/2018 08:28   Dg Chest Port 1v Same Day  Result Date: 12/18/2018 CLINICAL DATA:  Shortness of breath EXAM: PORTABLE CHEST 1 VIEW COMPARISON:  December 17, 2018 FINDINGS: The heart size and mediastinal contours are within normal limits. Both lungs are clear. The visualized skeletal structures are unremarkable. IMPRESSION: No active disease. Electronically Signed   By: Dorise Bullion III M.D   On: 12/18/2018 16:01   Vas [place] Upper Extremity Venous Duplex  Result Date: 12/25/2018 UPPER VENOUS STUDY  Indications: Swelling Limitations:  Patient positioning, wrist restraints. Performing Technologist: Oliver Hum RVT  Examination Guidelines: A complete evaluation includes B-mode imaging, spectral Doppler, color Doppler, and power Doppler as needed of all accessible portions of each vessel. Bilateral testing is considered an integral part of a complete examination. Limited examinations for reoccurring indications may be performed as noted.  Right Findings: +----------+------------+---------+-----------+----------+--------------+  RIGHT      Compressible Phasicity Spontaneous Properties    Summary      +----------+------------+---------+-----------+----------+--------------+  IJV            Full        Yes        Yes                                +----------+------------+---------+-----------+----------+--------------+  Subclavian     Full        Yes        Yes                                +----------+------------+---------+-----------+----------+--------------+  Axillary       Full        Yes        Yes                                +----------+------------+---------+-----------+----------+--------------+  Brachial       Full        Yes        Yes                                +----------+------------+---------+-----------+----------+--------------+  Radial         Full                                                      +----------+------------+---------+-----------+----------+--------------+  Ulnar  Not visualized  +----------+------------+---------+-----------+----------+--------------+  Cephalic       Full                                                      +----------+------------+---------+-----------+----------+--------------+  Basilic        Full                                                      +----------+------------+---------+-----------+----------+--------------+  Left Findings: +----------+------------+---------+-----------+----------+-------+  LEFT        Compressible Phasicity Spontaneous Properties Summary  +----------+------------+---------+-----------+----------+-------+  Subclavian     Full        Yes        Yes                         +----------+------------+---------+-----------+----------+-------+  Summary:  Right: No evidence of deep vein thrombosis in the upper extremity. No evidence of superficial vein thrombosis in the upper extremity.  Left: No evidence of thrombosis in the subclavian.  *See table(s) above for measurements and observations.    Preliminary    [place] Ekg Site Rite  Result Date: 12/30/2018 If [facility] image not attached, placement could not be confirmed due to current cardiac rhythm.  US Abdomen Limited Ruq  Result Date: 12/19/2018 CLINICAL DATA:  Elevated LFTs EXAM: ULTRASOUND ABDOMEN LIMITED RIGHT UPPER QUADRANT COMPARISON:  None. FINDINGS: Gallbladder: Evaluation of the gallbladder is limited. There appears to be a thickened wall measuring up to 5.5 mm. No stones, pericholecystic fluid, or Murphy's sign identified. Common bile duct: Diameter: 4.0 mm Liver: No focal lesion identified. Within normal limits in parenchymal echogenicity. Portal vein is patent on color Doppler imaging with normal direction of blood flow towards the liver. IMPRESSION: 1. The study is limited due to patient condition and difficulty following instructions. The gallbladder is not well assessed as it is contracted. The wall thickening to 5.5 mm could be due to lack of distention. No stones, pericholecystic fluid, or Murphy's sign identified. If there is concern for acute cholecystitis, recommend a HIDA scan. 2. No other abnormalities. Electronically Signed   By: Dorise Bullion III M.D   On: 12/19/2018 13:54     PERTINENT LAB RESULTS: CBC: Recent Labs    12/31/18 0439 01/01/19 0504  WBC 10.6* 6.0  HGB 9.7* 9.8*  HCT 28.2* 28.6*  PLT 660* 588*   CMET CMP     Component Value Date/Time   NA 134 (L) 12/31/2018 0439   K 3.8 12/31/2018 0439    CL 99 12/31/2018 0439   CO2 22 12/31/2018 0439   GLUCOSE 107 (H) 12/31/2018 0439   BUN 12 12/31/2018 0439   CREATININE 1.10 12/31/2018 0439   CALCIUM 9.4 12/31/2018 0439   PROT 6.3 (L) 12/28/2018 0353   ALBUMIN 2.2 (L) 12/28/2018 0353   AST 34 12/28/2018 0353   ALT 37 12/28/2018 0353   ALKPHOS 44 12/28/2018 0353   BILITOT 0.9 12/28/2018 0353   GFRNONAA >60 12/31/2018 0439   GFRAA >60 12/31/2018 0439    GFR Estimated Creatinine Clearance: 100.8 mL/min (by C-G formula based on SCr of 1.1 mg/dL). No results for input(s): LIPASE,  AMYLASE in the last 72 hours. No results for input(s): CKTOTAL, CKMB, CKMBINDEX, TROPONINI in the last 72 hours. Invalid input(s): POCBNP No results for input(s): DDIMER in the last 72 hours. No results for input(s): HGBA1C in the last 72 hours. No results for input(s): CHOL, HDL, LDLCALC, TRIG, CHOLHDL, LDLDIRECT in the last 72 hours. No results for input(s): TSH, T4TOTAL, T3FREE, THYROIDAB in the last 72 hours.  Invalid input(s): FREET3 No results for input(s): VITAMINB12, FOLATE, FERRITIN, TIBC, IRON, RETICCTPCT in the last 72 hours. Coags: No results for input(s): INR in the last 72 hours.  Invalid input(s): PT Microbiology: Recent Results (from the past 240 hour(s))  C difficile quick scan w PCR reflex     Status: None   Collection Time: 12/24/18  1:54 AM  Result Value Ref Range Status   C Diff antigen NEGATIVE NEGATIVE Final   C Diff toxin NEGATIVE NEGATIVE Final   C Diff interpretation No C. difficile detected.  Final    Comment: Performed at [facility], [place] [address], [place], [place] [address]  Culture, Urine     Status: Abnormal   Collection Time: 12/26/18 10:22 AM  Result Value Ref Range Status   Specimen Description URINE, CATHETERIZED  Final   Special Requests NONE  Final   Culture (A)  Final    <10,000 COLONIES/mL INSIGNIFICANT GROWTH Performed at [facility], [place] [address] [place], [place] [address]    Report  Status 12/27/2018 FINAL  Final  Culture, blood (routine x 2)     Status: None   Collection Time: 12/26/18 10:47 AM  Result Value Ref Range Status   Specimen Description BLOOD RIGHT ANTECUBITAL  Final   Special Requests   Final    BOTTLES DRAWN AEROBIC ONLY Blood Culture adequate volume   Culture   Final    NO GROWTH 5 DAYS Performed at [facility], [place] [address] [place], [place] [address]    Report Status 12/31/2018 FINAL  Final  Culture, blood (routine x 2)     Status: None   Collection Time: 12/26/18 10:53 AM  Result Value Ref Range Status   Specimen Description BLOOD RIGHT ARM  Final   Special Requests   Final    BOTTLES DRAWN AEROBIC ONLY Blood Culture adequate volume   Culture   Final    NO GROWTH 5 DAYS Performed at [facility], [place] [address], [place], [place] [address]    Report Status 12/31/2018 FINAL  Final  Aerobic/Anaerobic Culture (surgical/deep wound)     Status: None (Preliminary result)   Collection Time: 12/30/18  1:11 PM  Result Value Ref Range Status   Specimen Description TISSUE  Final   Special Requests   Final    RIGHT BICEP TENSON SHEATH PATIENT ON FOLLOWING ROCEPHIN   Gram Stain   Final    ABUNDANT WBC PRESENT,BOTH PMN AND MONONUCLEAR NO ORGANISMS SEEN    Culture   Final    NO GROWTH 3 DAYS NO ANAEROBES ISOLATED; CULTURE IN PROGRESS FOR 5 DAYS Performed at [facility], [place] [address], [place], [place] [address]    Report Status PENDING  Incomplete  Aerobic/Anaerobic Culture (surgical/deep wound)     Status: None (Preliminary result)   Collection Time: 12/30/18  1:24 PM  Result Value Ref Range Status   Specimen Description SYNOVIAL RIGHT SHOULDER  Final   Special Requests FLUID ON SWABS PATIENT ON FOLLOWING ROCEPHIN  Final   Gram Stain  Final    ABUNDANT WBC PRESENT, PREDOMINANTLY PMN NO ORGANISMS SEEN    Culture   Final    NO GROWTH 3 DAYS NO ANAEROBES ISOLATED; CULTURE IN PROGRESS FOR 5 DAYS Performed at [place] [address] [place], [place] [address]    Report Status PENDING  Incomplete    FURTHER DISCHARGE INSTRUCTIONS:  Get Medicines reviewed and adjusted: Please take all your medications with you for your next visit with your Primary MD  Laboratory/radiological data: Please request your Primary MD to go over all hospital tests and procedure/radiological results at the follow up, please ask your Primary MD to get all Hospital records sent to his/her office.  In some cases, they will be blood work, cultures and biopsy results pending at the time of your discharge. Please request that your primary care M.D. goes through all the records of your hospital data and follows up on these results.  Also Note the following: If you experience worsening of your admission symptoms, develop shortness of breath, life threatening emergency, suicidal or homicidal thoughts you must seek medical attention immediately by calling 911 or calling your MD immediately  if symptoms less severe.  You must read complete instructions/literature along with all the possible adverse reactions/side effects for all the Medicines you take and that have been prescribed to you. Take any new Medicines after you have completely understood and accpet all the possible adverse reactions/side effects.   Do not drive when taking Pain medications or sleeping medications (Benzodaizepines)  Do not take more than prescribed Pain, Sleep and Anxiety Medications. It is not advisable to combine anxiety,sleep and pain medications without talking with your primary care practitioner  Special Instructions: If you have smoked or chewed Tobacco  in the last 2 yrs please stop smoking, stop any regular Alcohol  and or any Recreational drug use.  Wear Seat belts while driving.  Please note: You were cared for by a hospitalist during your hospital stay. Once you are discharged, your primary care physician will handle any further medical  issues. Please note that NO REFILLS for any discharge medications will be authorized once you are discharged, as it is imperative that you return to your primary care physician (or establish a relationship with a primary care physician if you do not have one) for your post hospital discharge needs so that they can reassess your need for medications and monitor your lab values.  Total Time spent coordinating discharge including counseling, education and face to face time equals 35 minutes.  Signed: Darrien Belter 01/02/2019 2:12 PM

## 2019-01-02 NOTE — Progress Notes (Signed)
Patient ID: Darren Diaz, male   DOB: 23-Feb-1986, 33 y.o.   MRN: [id number]         [facility] for Infectious Disease  Date of Admission:  12/17/2018   Total days of antibiotics 16         ASSESSMENT: He is improving on therapy for severe group A streptococcal bacteremia originating from right leg cellulitis and complicated by right shoulder infection.  PLAN: 1. Continue ceftriaxone for 6 weeks total  Diagnosis: Bacteremia and septic arthritis  Culture Result: Group A strep   No Known Allergies  OPAT Orders Discharge antibiotics: Per pharmacy protocol ceftriaxone  Duration: 6 weeks End Date: 01/30/2019  [facility] Care Per Protocol:  Labs weekly while on IV antibiotics: _x_ CBC with differential _x_ BMP __ CMP _x_ CRP _x_ ESR __ Vancomycin trough __ CK  _x_ Please pull PIC at completion of IV antibiotics __ Please leave PIC in place until doctor has seen patient or been notified  Fax weekly labs to [phone]  Clinic Follow Up Appt: 01/30/2019  Principal Problem:   Bacterial infection due to streptococcus, group A Active Problems:   Septic arthritis of shoulder, right (HCC)   Acute respiratory failure ([place])   Alcohol withdrawal seizure, with unspecified complication ([place])   Nonischemic cardiomyopathy ([place])   Pressure injury of skin   AKI (acute kidney injury) ([place])   Acute combined systolic and diastolic heart failure (HCC)   Scheduled Meds: . carvedilol  12.5 mg Oral BID WC  . enoxaparin (LOVENOX) injection  40 mg Subcutaneous Q24H  . feeding supplement (ENSURE ENLIVE)  237 mL Oral BID BM  . folic acid  1 mg Oral Daily  . gabapentin  300 mg Oral TID  . isosorbide dinitrate  10 mg Oral TID  . levETIRAcetam  1,000 mg Oral BID  . mouth rinse  15 mL Mouth Rinse BID  . multivitamin with minerals  1 tablet Oral Daily  . nicotine  14 mg Transdermal Daily  . pantoprazole  40 mg Oral Daily  . polyethylene glycol  17 g Oral Daily  . vitamin B-6  100 mg  Oral Daily  . sacubitril-valsartan  1 tablet Oral BID  . thiamine  100 mg Oral Daily   Continuous Infusions: . cefTRIAXone (ROCEPHIN)  IV 2 g (01/02/19 1057)  . methocarbamol (ROBAXIN) IV     PRN Meds:.acetaminophen, bisacodyl, Gerhardt's butt cream, haloperidol lactate, hydrALAZINE, levalbuterol, methocarbamol **OR** methocarbamol (ROBAXIN) IV, metoCLOPramide **OR** metoCLOPramide (REGLAN) injection, morphine injection, ondansetron **OR** ondansetron (ZOFRAN) IV, oxyCODONE, phenol, sennosides, sodium chloride flush, sodium phosphate   SUBJECTIVE: He is still having right shoulder pain but overall is feeling much better.  Review of Systems: Review of Systems  Constitutional: Negative for chills, diaphoresis and fever.  Musculoskeletal: Positive for joint pain.    No Known Allergies  OBJECTIVE: Vitals:   01/01/19 1422 01/02/19 0220 01/02/19 0500 01/02/19 0521  BP: 136/77 138/72  132/73  Pulse: 80 72  71  Resp: '18 20  18  ' Temp: 99.7 F (37.6 C) 98.6 F (37 C)  98.3 F (36.8 C)  TempSrc: Oral Oral  Oral  SpO2: 98% 98%  95%  Weight:   87.6 kg   Height:       Body mass index is 26.94 kg/m.  Physical Exam Constitutional:      Comments: He is alert and appears comfortable.  He is sitting up in a chair.  Musculoskeletal:     Comments: Clean, dry gauze dressings over right  shoulder incisions.  Skin:    Comments: New left arm PICC.  Psychiatric:        Mood and Affect: Mood normal.     Lab Results Lab Results  Component Value Date   WBC 6.0 01/01/2019   HGB 9.8 (L) 01/01/2019   HCT 28.6 (L) 01/01/2019   MCV 100.0 01/01/2019   PLT 588 (H) 01/01/2019    Lab Results  Component Value Date   CREATININE 1.10 12/31/2018   BUN 12 12/31/2018   NA 134 (L) 12/31/2018   K 3.8 12/31/2018   CL 99 12/31/2018   CO2 22 12/31/2018    Lab Results  Component Value Date   ALT 37 12/28/2018   AST 34 12/28/2018   ALKPHOS 44 12/28/2018   BILITOT 0.9 12/28/2018      Microbiology: Recent Results (from the past 240 hour(s))  C difficile quick scan w PCR reflex     Status: None   Collection Time: 12/24/18  1:54 AM  Result Value Ref Range Status   C Diff antigen NEGATIVE NEGATIVE Final   C Diff toxin NEGATIVE NEGATIVE Final   C Diff interpretation No C. difficile detected.  Final    Comment: Performed at [facility], [place] [address], [place], [place] [address]  Culture, Urine     Status: Abnormal   Collection Time: 12/26/18 10:22 AM  Result Value Ref Range Status   Specimen Description URINE, CATHETERIZED  Final   Special Requests NONE  Final   Culture (A)  Final    <10,000 COLONIES/mL INSIGNIFICANT GROWTH Performed at [facility], [place] [address], [place], [place] [address]    Report Status 12/27/2018 FINAL  Final  Culture, blood (routine x 2)     Status: None   Collection Time: 12/26/18 10:47 AM  Result Value Ref Range Status   Specimen Description BLOOD RIGHT ANTECUBITAL  Final   Special Requests   Final    BOTTLES DRAWN AEROBIC ONLY Blood Culture adequate volume   Culture   Final    NO GROWTH 5 DAYS Performed at [facility], [place] [address], [place], [place] [address]    Report Status 12/31/2018 FINAL  Final  Culture, blood (routine x 2)     Status: None   Collection Time: 12/26/18 10:53 AM  Result Value Ref Range Status   Specimen Description BLOOD RIGHT ARM  Final   Special Requests   Final    BOTTLES DRAWN AEROBIC ONLY Blood Culture adequate volume   Culture   Final    NO GROWTH 5 DAYS Performed at [facility], [place] [address], [place], [place] [address]    Report Status 12/31/2018 FINAL  Final  Aerobic/Anaerobic Culture (surgical/deep wound)     Status: None (Preliminary result)   Collection Time: 12/30/18  1:11 PM  Result Value Ref Range Status   Specimen Description TISSUE  Final   Special Requests   Final    RIGHT BICEP TENSON SHEATH PATIENT ON FOLLOWING ROCEPHIN   Gram Stain   Final    ABUNDANT WBC  PRESENT,BOTH PMN AND MONONUCLEAR NO ORGANISMS SEEN    Culture   Final    NO GROWTH 3 DAYS NO ANAEROBES ISOLATED; CULTURE IN PROGRESS FOR 5 DAYS Performed at [facility], [place] [address], [place], [place] [address]    Report Status PENDING  Incomplete  Aerobic/Anaerobic Culture (surgical/deep wound)     Status: None (Preliminary result)   Collection Time: 12/30/18  1:24 PM  Result Value Ref Range Status   Specimen Description SYNOVIAL RIGHT SHOULDER  Final   Special Requests FLUID ON SWABS PATIENT ON FOLLOWING ROCEPHIN  Final   Gram Stain   Final    ABUNDANT WBC PRESENT, PREDOMINANTLY PMN NO ORGANISMS SEEN    Culture   Final    NO GROWTH 3 DAYS NO ANAEROBES ISOLATED; CULTURE IN PROGRESS FOR 5 DAYS Performed at [facility], [place] [address] [place], [place] [address]    Report Status PENDING  Incomplete    Michel Bickers, MD [facility] [place] [facility] [phone] pager   [phone] cell 01/02/2019, 11:53 AM

## 2019-01-02 NOTE — Progress Notes (Signed)
PHARMACY CONSULT NOTE FOR:  OUTPATIENT  PARENTERAL ANTIBIOTIC THERAPY (OPAT)  Indication: R leg cellulitis, Group A Strep Bacteremia, MRI suggests early humeral ostemyelitis. Plan is for 6 weeks of ceftriaxone. Regimen: Ceftriaxone 2g q24h End date: 02/10/2019  IV antibiotic discharge orders are pended. To discharging provider:  please sign these orders via discharge navigator,  Select New Orders & click on the button choice - Manage This Unsigned Work.    Thank you for allowing pharmacy to be a part of this patient's care.  Wendelyn Breslow, PharmD PGY1 Pharmacy Resident Phone: [phone] 01/02/2019 8:55 AM

## 2019-01-03 ENCOUNTER — Other Ambulatory Visit: Payer: Self-pay

## 2019-01-03 ENCOUNTER — Ambulatory Visit: Payer: BLUE CROSS/BLUE SHIELD | Attending: Nurse Practitioner | Admitting: Nurse Practitioner

## 2019-01-04 ENCOUNTER — Telehealth (INDEPENDENT_AMBULATORY_CARE_PROVIDER_SITE_OTHER): Payer: Self-pay | Admitting: Orthopedic Surgery

## 2019-01-04 LAB — AEROBIC/ANAEROBIC CULTURE W GRAM STAIN (SURGICAL/DEEP WOUND)
Culture: NO GROWTH
Culture: NO GROWTH

## 2019-01-04 LAB — AEROBIC/ANAEROBIC CULTURE (SURGICAL/DEEP WOUND)

## 2019-01-04 NOTE — Telephone Encounter (Signed)
No restriction on active and passive range of motion of the right shoulder.

## 2019-01-04 NOTE — Telephone Encounter (Signed)
[facility]/PT called and wanted to get order for therapy and limitations. And/or restrictions  Call Apolinar Junes [phone]

## 2019-01-04 NOTE — Telephone Encounter (Signed)
I tried calling, no answer. LM to [facility]

## 2019-01-04 NOTE — Telephone Encounter (Signed)
Please advise. Thanks.  

## 2019-01-10 ENCOUNTER — Encounter: Payer: Self-pay | Admitting: Internal Medicine

## 2019-01-11 ENCOUNTER — Telehealth: Payer: Self-pay

## 2019-01-11 NOTE — Telephone Encounter (Signed)
Nurse with [facility] called to report picc line dressing changed today due to being visibly soiled. Nurse reported redness around site and patient denied and tenderness or pain. Picc line flushing well. Routing to MD to make aware.  Valarie Cones, LPN

## 2019-01-12 NOTE — Telephone Encounter (Signed)
Let us have them watch it closely for now.

## 2019-01-12 NOTE — Telephone Encounter (Signed)
Home Health team made aware to monitor the area  closely for now per Dr. Orvan Falconer.  Valarie Cones, LPN

## 2019-01-20 ENCOUNTER — Encounter: Payer: Self-pay | Admitting: *Deleted

## 2019-01-20 ENCOUNTER — Telehealth: Payer: Self-pay | Admitting: *Deleted

## 2019-01-20 NOTE — Telephone Encounter (Signed)
   Cardiac Questionnaire:    Since your last visit or hospitalization:    1. Have you been having new or worsening chest pain? NO   2. Have you been having new or worsening shortness of breath? NO 3. Have you been having new or worsening leg swelling, wt gain, or increase in abdominal girth (pants fitting more tightly)? NO   4. Have you had any passing out spells? NO    *A YES to any of these questions would result in the appointment being kept. *If all the answers to these questions are NO, we should indicate that given the current situation regarding the worldwide coronarvirus pandemic, at the recommendation of the CDC, we are looking to limit gatherings in our waiting area, and thus will reschedule their appointment beyond four weeks from today.   _____________   COVID-19 Pre-Screening Questions:  . Do you currently have a fever? NO . Have you recently travelled on a cruise, internationally, or to [place], [place], [place], [place], [place], or [place], [place] [employer]) ? NO  . Have you been in contact with someone that is currently pending confirmation of Covid19 testing or has been confirmed to have the Covid19 virus? NO Are you currently experiencing fatigue or cough? NO      Spoke with patient and he is ok with doing a Webex visit with Dr. Allyson Sabal January 24, 2019. We went over his medications, pharmacy, allergies, and history.

## 2019-01-23 ENCOUNTER — Telehealth: Payer: Self-pay | Admitting: Cardiovascular Disease

## 2019-01-23 NOTE — Telephone Encounter (Addendum)
LVM for pre reg x3 °

## 2019-01-24 ENCOUNTER — Telehealth: Payer: Self-pay

## 2019-01-24 ENCOUNTER — Telehealth (INDEPENDENT_AMBULATORY_CARE_PROVIDER_SITE_OTHER): Payer: BLUE CROSS/BLUE SHIELD | Admitting: Cardiovascular Disease

## 2019-01-24 VITALS — BP 119/73 | HR 63 | Wt 190.0 lb

## 2019-01-24 DIAGNOSIS — I5041 Acute combined systolic (congestive) and diastolic (congestive) heart failure: Secondary | ICD-10-CM | POA: Diagnosis not present

## 2019-01-24 DIAGNOSIS — I428 Other cardiomyopathies: Secondary | ICD-10-CM

## 2019-01-24 DIAGNOSIS — Z720 Tobacco use: Secondary | ICD-10-CM | POA: Diagnosis not present

## 2019-01-24 DIAGNOSIS — I519 Heart disease, unspecified: Secondary | ICD-10-CM

## 2019-01-24 MED ORDER — SACUBITRIL-VALSARTAN 49-51 MG PO TABS: 1.0000 | ORAL_TABLET | Freq: Two times a day (BID) | ORAL | 3 refills | Status: DC

## 2019-01-24 MED ORDER — ISOSORBIDE MONONITRATE ER 30 MG PO TB24: 30.0000 mg | ORAL_TABLET | Freq: Every day | ORAL | 3 refills | Status: DC

## 2019-01-24 MED ORDER — CARVEDILOL 12.5 MG PO TABS: 12.5000 mg | ORAL_TABLET | Freq: Two times a day (BID) | ORAL | 3 refills | Status: DC

## 2019-01-24 NOTE — Progress Notes (Signed)
Virtual Visit via Video Note   This visit type was conducted due to national recommendations for restrictions regarding the COVID-19 Pandemic (e.g. social distancing) in an effort to limit this patient's exposure and mitigate transmission in our community.  Due to his co-morbid illnesses, this patient is at least at moderate risk for complications without adequate follow up.  This format is felt to be most appropriate for this patient at this time.  All issues noted in this document were discussed and addressed.  A limited physical exam was performed with this format.  Please refer to the patient's chart for his consent to telehealth for [facility].   Evaluation Performed:  Follow-up visit  Date:  01/24/2019   ID:  Darren Diaz, DOB November 09, 1985, MRN [id number]  Patient Location: Home  Provider Location: Home  PCP:  Patient, No Pcp Per  Cardiologist:  Nanetta Batty, MD  Electrophysiologist:  None   Chief Complaint: First post hospital visit for cardiomyopathy  History of Present Illness:    Darren Diaz is a 33 y.o. male who presents via audio/video conferencing for a telehealth visit today.    Mr. Darren Diaz is a 33 year old single Caucasian male with no children, patient of Dr. Virl Son who I am seeing for the first time post hospital discharge for follow-up.  He works as an Corporate investment banker.  His cardiac risk factor profile is notable for tobacco abuse smoking one third of a pack a day for last 7 years.  He is not diabetic, hypertensive or hyperlipidemic to his knowledge.  There is no family history for heart disease.  He is never had a heart attack or stroke.  He is fairly active and denies chest pain or shortness of breath.  He does have a long history of ethanol abuse and has had 2 hospitalizations in the past as well as withdrawal seizures.  His last hospitalization was 12/17/2018 for 2 weeks.  He remembers having a syncopal episode in his kitchen with his girlfriend  present at the time and he was brought to [facility] by EMS.  He was found to have strep bacteremia.  A 2D echocardiogram was performed to rule out endocarditis which did show an EF of 35%.  He ultimately had his right shoulder debrided for a septic shoulder by Dr. Dorene Grebe on 12/30/2018 and this is healing nicely.  He is basically had no significant symptoms since being at home although he does admit to continuing to drink alcohol.  He has no symptoms of heart failure.  He remains on carvedilol, isosorbide and Entresto.  The patient does not have symptoms concerning for COVID-19 infection (fever, chills, cough, or new shortness of breath).    Past Medical History:  Diagnosis Date  . Alcohol abuse   . S/P PDA repair age 58  . Seizures (HCC) 08/2018  . Tobacco abuse    Past Surgical History:  Procedure Laterality Date  . CHL IP CC INTUBATION ORDABLE  12/21/2018      . IRRIGATION AND DEBRIDEMENT SHOULDER Right 12/30/2018   Procedure: Irrigation And Debridement Biceps Tendon Sheath;  Surgeon: Cammy Copa, MD;  Location: [facility] OR;  Service: Orthopedics;  Laterality: Right;  . 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 SHOULDER ARTHROSCOPY Right 12/30/2018   Procedure: ARTHROSCOPIC IRRIGATION AND DEBRIDEMENT OF SHOULDER JOINT;  Surgeon: Cammy Copa, MD;  Location: [facility] OR;  Service: Orthopedics;  Laterality: Right;     No outpatient  medications have been marked as taking for the 01/24/19 encounter (Telemedicine) with Runell Gess, MD.     Allergies:   Patient has no known allergies.   Social History   Tobacco Use  . Smoking status: Current Every Day Smoker    Types: Cigarettes  . Smokeless tobacco: Never Used  . Tobacco comment: 09/28/18 1/3 PPD  Substance Use Topics  . Alcohol use: Yes    Alcohol/week: 6.0 standard drinks    Types: 3 Cans of beer, 3 Standard drinks or equivalent per week    Comment: 09/28/18 1-2 daily  . Drug use: No     Family  Hx: The patient's family history includes Fibromyalgia in his mother.  ROS:   Please see the history of present illness.     All other systems reviewed and are negative.   Prior CV studies:   The following studies were reviewed today:  2D echocardiogram  Labs/Other Tests and Data Reviewed:    EKG:  No ECG reviewed.  Recent Labs: 12/21/2018: B Natriuretic Peptide 3,126.8 12/25/2018: TSH 4.715 12/28/2018: ALT 37; Magnesium 1.9 12/31/2018: BUN 12; Creatinine, Ser 1.10; Potassium 3.8; Sodium 134 01/01/2019: Hemoglobin 9.8; Platelets 588   Recent Lipid Panel Lab Results  Component Value Date/Time   TRIG 68 09/08/2018 04:19 AM    Wt Readings from Last 3 Encounters:  01/24/19 190 lb (86.2 kg)  01/02/19 193 lb 2 oz (87.6 kg)  09/28/18 203 lb 6.4 oz (92.3 kg)     Objective:    Vital Signs:  BP 119/73   Pulse 63   Wt 190 lb (86.2 kg)   BMI 26.50 kg/m    A complete physical exam was not performed today since this was a telemedicine video visit.  ASSESSMENT & PLAN:    1. Nonischemic cardiomyopathy- EF in the hospital during an episode of alcohol withdrawal and sepsis was 35%.  He has no prior history of this.  He does have a history of PDA repair at age 56 at [facility].  He initially had a creatinine of 1.5 which fell back to 1.1 at discharge.  He was discharged home on carvedilol, isosorbide and Entresto. 2. Tobacco abuse- continues to smoke one third of a pack a day 3. Alcohol abuse- continues to drink beer and shots of liquor despite recent hospitalization and advice to the contrary  COVID-19 Education: The signs and symptoms of COVID-19 were discussed with the patient and how to seek care for testing (follow up with PCP or arrange E-visit).  The importance of social distancing was discussed today.  Time:   Today, I have spent 20 minutes with the patient with telehealth technology discussing the above problems.     Medication Adjustments/Labs and Tests Ordered: Current  medicines are reviewed at length with the patient today.  Concerns regarding medicines are outlined above.   Tests Ordered: No orders of the defined types were placed in this encounter.   Medication Changes: No orders of the defined types were placed in this encounter.   Disposition:  Follow up in 3 month(s)  Signed, Nanetta Batty, MD  01/24/2019 10:38 AM    [facility]

## 2019-01-24 NOTE — Patient Instructions (Signed)
Medication Instructions:  Your physician recommends that you continue on your current medications as directed. Please refer to the Current Medication list given to you today.  If you need a refill on your cardiac medications before your next appointment, please call your pharmacy.   Lab work: NONE If you have labs (blood work) drawn today and your tests are completely normal, you will receive your results only by: Marland Kitchen MyChart Message (if you have MyChart) OR . A paper copy in the mail If you have any lab test that is abnormal or we need to change your treatment, we will call you to review the results.  Testing/Procedures: Your physician has requested that you have an echocardiogram. Echocardiography is a painless test that uses sound waves to create images of your heart. It provides your doctor with information about the size and shape of your heart and how well your heart's chambers and valves are working. This procedure takes approximately one hour. There are no restrictions for this procedure.    Follow-Up: At [facility], you and your health needs are our priority.  As part of our continuing mission to provide you with exceptional heart care, we have created designated Provider Care Teams.  These Care Teams include your primary Cardiologist (physician) and Advanced Practice Providers (APPs -  Physician Assistants and Nurse Practitioners) who all work together to provide you with the care you need, when you need it. You will need a follow up appointment in 3 months with Dr. Allyson Sabal.  This appointment should be set up for a date that is after your echocardiogram. Please call our office 2 months in advance to schedule this appointment.

## 2019-01-24 NOTE — Addendum Note (Signed)
Addended by: Harlow Asa on: 01/24/2019 11:10 AM   Modules accepted: Orders

## 2019-01-24 NOTE — Telephone Encounter (Signed)
Virtual Visit Pre-Appointment Phone Call  Steps For Call:  1. Confirm consent - "In the setting of the current Covid19 crisis, you are scheduled for a (phone or video) visit with your provider on (date) at (time).  Just as we do with many in-office visits, in order for you to participate in this visit, we must obtain consent.  If you'd like, I can send this to your mychart (if signed up) or email for you to review.  Otherwise, I can obtain your verbal consent now.  All virtual visits are billed to your insurance company just like a normal visit would be.  By agreeing to a virtual visit, we'd like you to understand that the technology does not allow for your provider to perform an examination, and thus may limit your provider's ability to fully assess your condition.  Finally, though the technology is pretty good, we cannot assure that it will always work on either your or our end, and in the setting of a video visit, we may have to convert it to a phone-only visit.  In either situation, we cannot ensure that we have a secure connection.  Are you willing to proceed?" STAFF: Did the patient verbally acknowledge consent to treatment? yes  2. Give patient instructions for WebEx/MyChart download to smartphone or Doximity/[email or web address] if video visit (depending on what platform provider is using)  3. Advise patient to be prepared with any vital sign or heart rhythm information, their current medicines, and a piece of paper and pen handy for any instructions they may receive the day of their visit  4. Inform patient they will receive a phone call 15 minutes prior to their appointment time (may be from unknown caller ID) so they should be prepared to answer  5. Confirm that appointment type is correct in Epic appointment notes (video vs telephone)    TELEPHONE CALL NOTE  Darren Diaz has been deemed a candidate for a follow-up tele-health visit to limit community exposure during the Covid-19 pandemic. I  spoke with the patient via phone to ensure availability of phone/video source, confirm preferred email & phone number, and discuss instructions and expectations.  I reminded Darren Diaz to be prepared with any vital sign and/or heart rhythm information that could potentially be obtained via home monitoring, at the time of his visit. I reminded Darren Diaz to expect a phone call at the time of his visit if his visit.  Darren Diaz Darren Banda, RN 01/24/2019    DOWNLOADING THE WEBEX SOFTWARE TO SMARTPHONE  - If Apple, go to App Store and type in WebEx in the search bar. Download [employer], the blue/green circle. The app is free but as with any other app downloads, their phone may require them to verify saved payment information or Apple password. The patient does NOT have to create an account.  - If Android, ask patient to go to [employer] and type in WebEx in the search bar. Download [employer], the blue/green circle. The app is free but as with any other app downloads, their phone may require them to verify saved payment information or Android password. The patient does NOT have to create an account.   CONSENT FOR TELE-HEALTH VISIT - PLEASE REVIEW  I hereby voluntarily request, consent and authorize [facility] and its employed or contracted physicians, physician assistants, nurse practitioners or other licensed health care professionals (the Practitioner), to provide me with telemedicine health care services (the "Services") as deemed necessary by  the treating Practitioner. I acknowledge and consent to receive the Services by the Practitioner via telemedicine. I understand that the telemedicine visit will involve communicating with the Practitioner through live audiovisual communication technology and the disclosure of certain medical information by electronic transmission. I acknowledge that I have been given the opportunity to request an in-person assessment or other  available alternative prior to the telemedicine visit and am voluntarily participating in the telemedicine visit.  I understand that I have the right to withhold or withdraw my consent to the use of telemedicine in the course of my care at any time, without affecting my right to future care or treatment, and that the Practitioner or I may terminate the telemedicine visit at any time. I understand that I have the right to inspect all information obtained and/or recorded in the course of the telemedicine visit and may receive copies of available information for a reasonable fee.  I understand that some of the potential risks of receiving the Services via telemedicine include:  Marland Kitchen Delay or interruption in medical evaluation due to technological equipment failure or disruption; . Information transmitted may not be sufficient (e.g. poor resolution of images) to allow for appropriate medical decision making by the Practitioner; and/or  . In rare instances, security protocols could fail, causing a breach of personal health information.  Furthermore, I acknowledge that it is my responsibility to provide information about my medical history, conditions and care that is complete and accurate to the best of my ability. I acknowledge that Practitioner's advice, recommendations, and/or decision may be based on factors not within their control, such as incomplete or inaccurate data provided by me or distortions of diagnostic images or specimens that may result from electronic transmissions. I understand that the practice of medicine is not an exact science and that Practitioner makes no warranties or guarantees regarding treatment outcomes. I acknowledge that I will receive a copy of this consent concurrently upon execution via email to the email address I last provided but may also request a printed copy by calling the office of [facility].    I understand that my insurance will be billed for this visit.   I have  read or had this consent read to me. . I understand the contents of this consent, which adequately explains the benefits and risks of the Services being provided via telemedicine.  . I have been provided ample opportunity to ask questions regarding this consent and the Services and have had my questions answered to my satisfaction. . I give my informed consent for the services to be provided through the use of telemedicine in my medical care  By participating in this telemedicine visit I agree to the above.

## 2019-01-24 NOTE — Telephone Encounter (Signed)
Virtual Visit Pre-Appointment Phone Call  Steps For Call:  Confirm consent - "In the setting of the current Covid19 crisis, you are scheduled for a (phone or video) visit with your provider on (date) at (time).  Just as we do with many in-office visits, in order for you to participate in this visit, we must obtain consent.  If you'd like, I can send this to your mychart (if signed up) or email for you to review.  Otherwise, I can obtain your verbal consent now.  All virtual visits are billed to your insurance company just like a normal visit would be.  By agreeing to a virtual visit, we'd like you to understand that the technology does not allow for your provider to perform an examination, and thus may limit your provider's ability to fully assess your condition.  Finally, though the technology is pretty good, we cannot assure that it will always work on either your or our end, and in the setting of a video visit, we may have to convert it to a phone-only visit.  In either situation, we cannot ensure that we have a secure connection.  Are you willing to proceed?" STAFF: Did the patient verbally acknowledge consent to telehealth visit? YES 1. Confirm the BEST phone number to call the day of the visit:   2. Give patient instructions for WebEx/MyChart download to smartphone as below or Doximity/Doxy.me if video visit (depending on what platform provider is using)  3. Advise patient to be prepared with any vital sign or heart rhythm information, their current medicines, and a piece of paper and pen handy for any instructions they may receive the day of their visit  4. Inform patient they will receive a phone call 15 minutes prior to their appointment time (may be from unknown caller ID) so they should be prepared to answer  5. Confirm that appointment type is correct in Epic appointment notes (video vs telephone)     TELEPHONE CALL NOTE  Darren Diaz has been deemed a candidate for a follow-up  tele-health visit to limit community exposure during the Covid-19 pandemic. I spoke with the patient via phone to ensure availability of phone/video source, confirm preferred email & phone number, and discuss instructions and expectations.  I reminded Darren Diaz to be prepared with any vital sign and/or heart rhythm information that could potentially be obtained via home monitoring, at the time of his visit. I reminded Darren Diaz to expect a phone call at the time of his visit if his visit.  Deanna Wiater Cottie Banda, RN 01/24/2019    DOWNLOADING THE WEBEX APP TO SMARTPHONE  - If Apple, ask patient to go to App Store and type in WebEx in the search bar. Download [employer], the blue/green circle. If Android, go to [employer] and type in [employer] in the search bar. The app is free but as with any other app downloads, their phone may require them to verify saved payment information or Apple/Android password.  - The patient does NOT have to create an account. - On the day of the visit, the assist will walk the patient through joining the meeting with the meeting number/password.  DOWNLOADING THE MYCHART APP TO SMARTPHONE  - If Apple, go to [employer] and type in MyChart in the search bar and download the app. If Android, ask patient to go to [employer] and type in [place] in the search bar and download the app. The app is free  but as with any other app downloads, their phone may require them to verify saved payment information or Apple/Android password.  - The patient will need to then log into the app with their MyChart username and password, and select [facility] as their healthcare provider to link the account. When it is time for your visit, go to the MyChart app, find appointments, and click Begin Video Visit. Be sure to Select Allow for your device to access the Microphone and Camera for your visit. You will then be connected, and your provider will be with you shortly.  **If they  have any issues connecting, or need assistance please contact MyChart service desk [phone])**  **If using a computer, in order to ensure the best quality for your visit they will need to use either of the following Internet Browsers: [employer] [place], or Google Chrome**  FULL LENGTH CONSENT FOR TELE-HEALTH VISIT   I hereby voluntarily request, consent and authorize [facility] and its employed or contracted physicians, physician assistants, nurse practitioners or other licensed health care professionals (the Practitioner), to provide me with telemedicine health care services (the Services") as deemed necessary by the treating Practitioner. I acknowledge and consent to receive the Services by the Practitioner via telemedicine. I understand that the telemedicine visit will involve communicating with the Practitioner through live audiovisual communication technology and the disclosure of certain medical information by electronic transmission. I acknowledge that I have been given the opportunity to request an in-person assessment or other available alternative prior to the telemedicine visit and am voluntarily participating in the telemedicine visit.  I understand that I have the right to withhold or withdraw my consent to the use of telemedicine in the course of my care at any time, without affecting my right to future care or treatment, and that the Practitioner or I may terminate the telemedicine visit at any time. I understand that I have the right to inspect all information obtained and/or recorded in the course of the telemedicine visit and may receive copies of available information for a reasonable fee.  I understand that some of the potential risks of receiving the Services via telemedicine include:   Delay or interruption in medical evaluation due to technological equipment failure or disruption;  Information transmitted may not be sufficient (e.g. poor resolution of images) to  allow for appropriate medical decision making by the Practitioner; and/or   In rare instances, security protocols could fail, causing a breach of personal health information.  Furthermore, I acknowledge that it is my responsibility to provide information about my medical history, conditions and care that is complete and accurate to the best of my ability. I acknowledge that Practitioner's advice, recommendations, and/or decision may be based on factors not within their control, such as incomplete or inaccurate data provided by me or distortions of diagnostic images or specimens that may result from electronic transmissions. I understand that the practice of medicine is not an exact science and that Practitioner makes no warranties or guarantees regarding treatment outcomes. I acknowledge that I will receive a copy of this consent concurrently upon execution via email to the email address I last provided but may also request a printed copy by calling the office of [facility].    I understand that my insurance will be billed for this visit.   I have read or had this consent read to me.  I understand the contents of this consent, which adequately explains the benefits and risks of the Services being  provided via telemedicine.   I have been provided ample opportunity to ask questions regarding this consent and the Services and have had my questions answered to my satisfaction.  I give my informed consent for the services to be provided through the use of telemedicine in my medical care  By participating in this telemedicine visit I agree to the above.

## 2019-01-24 NOTE — Telephone Encounter (Signed)
LEFT DETAILED MESSAGE WITH AVS INSTRUCTIONS AND STATED THAT AVS WILL BE SENT TO PATIENT VIA MAIL

## 2019-01-30 ENCOUNTER — Encounter: Payer: Self-pay | Admitting: Internal Medicine

## 2019-01-30 ENCOUNTER — Other Ambulatory Visit: Payer: Self-pay

## 2019-01-30 ENCOUNTER — Ambulatory Visit (INDEPENDENT_AMBULATORY_CARE_PROVIDER_SITE_OTHER): Payer: BLUE CROSS/BLUE SHIELD | Admitting: Internal Medicine

## 2019-01-30 ENCOUNTER — Telehealth: Payer: Self-pay | Admitting: Pharmacist

## 2019-01-30 DIAGNOSIS — A498 Other bacterial infections of unspecified site: Secondary | ICD-10-CM

## 2019-01-30 DIAGNOSIS — M0080 Arthritis due to other bacteria, unspecified joint: Secondary | ICD-10-CM

## 2019-01-30 DIAGNOSIS — M01X0 Direct infection of unspecified joint in infectious and parasitic diseases classified elsewhere: Secondary | ICD-10-CM | POA: Diagnosis not present

## 2019-01-30 NOTE — Progress Notes (Signed)
Virtual Visit via Telephone Note  I connected with Darren Diaz on 01/30/19 at 10:00 AM EDT by telephone and verified that I am speaking with the correct person using two identifiers.   I discussed the limitations, risks, security and privacy concerns of performing an evaluation and management service by telephone and the availability of in person appointments. I also discussed with the patient that there may be a patient responsible charge related to this service. The patient expressed understanding and agreed to proceed.   History of Present Illness: I called and spoke to [facility] by phone this morning.  He was hospitalized recently with right lower leg cellulitis and group A streptococcal bacteremia.  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  Once he was more alert and ready to transfer out of the intensive care unit it became clear that he had septic right shoulder.  He underwent incision and drainage on 12/30/2018.  He was discharged home on IV ceftriaxone.  He is feeling much better.  His pain is decreased and he no longer needs to take narcotic pain medication every day.  He has not had any problems tolerating his PICC or ceftriaxone.  He has now completed 6 weeks of total antibiotic therapy.   Observations/Objective:   Assessment and Plan: I am quite hopeful that his infection has been cured through a combination of a long course of IV antibiotics and surgery.  Follow Up Instructions: Discontinue ceftriaxone and have his PICC removed I asked him to send me a MyChart message if he has any concerns about relapse of infection in the coming 4 to 6 weeks   I discussed the assessment and treatment plan with the patient. The patient was provided an opportunity to ask questions and all were answered. The patient agreed with the plan and demonstrated an understanding of the instructions.   The patient was advised to call back or seek an in-person evaluation if the symptoms worsen or if the condition fails  to improve as anticipated.  I provided 15 minutes of non-face-to-face time during this encounter.   Cliffton Asters, MD

## 2019-01-30 NOTE — Telephone Encounter (Signed)
Called and spoke to [place] at [facility] and gave verbal order per Dr. Orvan Falconer to stop patient's ceftriaxone and pull his PICC tomorrow. Coretta verbalized understanding.

## 2019-02-14 ENCOUNTER — Telehealth: Payer: Self-pay | Admitting: *Deleted

## 2019-02-14 ENCOUNTER — Encounter: Payer: Self-pay | Admitting: *Deleted

## 2019-02-14 NOTE — Telephone Encounter (Signed)
Pt returned call. Pt states he is ok keeping his original appt date and he has no problem with doing a Virtual Visit. I confirmed the email in the pts chart.

## 2019-02-14 NOTE — Telephone Encounter (Signed)
Called patient to update EMR. He wasn't home but stated his medications haven;t changed since being in hospital in March. Updated remainder.

## 2019-02-14 NOTE — Telephone Encounter (Signed)
LVM requesting call back due to current COVID 19 pandemic, our office is severely reducing in person visits in order to minimize the risk to our patients and healthcare providers. We recommend to convert your appointment to a video visit and can move appt sooner.

## 2019-02-22 ENCOUNTER — Telehealth: Payer: Self-pay | Admitting: Diagnostic Neuroimaging

## 2019-02-22 DIAGNOSIS — R569 Unspecified convulsions: Secondary | ICD-10-CM

## 2019-02-22 MED ORDER — LEVETIRACETAM 1000 MG PO TABS: 1000.0000 mg | ORAL_TABLET | Freq: Two times a day (BID) | ORAL | 0 refills | Status: DC

## 2019-02-22 NOTE — Telephone Encounter (Signed)
Pt has called for a refill on levETIRAcetam (KEPPRA) 1000 MG tablet (Expired)  gabapentin (NEURONTIN) 600 MG tablet  Pt states that Dr Marjory Lies told him he will take over the prescribing of these two medications.  Pt using: [employer] [phone]

## 2019-02-22 NOTE — Addendum Note (Signed)
Addended by: [place] Pink on: 02/22/2019 04:18 PM   Modules accepted: Orders

## 2019-02-22 NOTE — Telephone Encounter (Addendum)
Keppra refilled x 1 month with note to pharmacy: keep video FU next week. Dr Denyse Amass doesn't prescribe gabapentin.

## 2019-02-28 ENCOUNTER — Ambulatory Visit: Payer: BLUE CROSS/BLUE SHIELD | Admitting: Diagnostic Neuroimaging

## 2019-02-28 ENCOUNTER — Other Ambulatory Visit: Payer: Self-pay

## 2019-02-28 NOTE — Telephone Encounter (Addendum)
Patient didn't  connect for video visit this morning. LVM advising him to call and rescheduled video visit. We refilled his  keppra  x 1 month on 02/22/19. Left number. Per Dr Marjory Lies, he will be rescheduled with A Lomax, NP.

## 2019-03-23 IMAGING — CT CT HEAD W/O CM
3 series · 14 of 47 positions shown, 16 images · non-contrast
Comparison: MRI 09/18/2018

CLINICAL DATA: Seizure, head trauma

EXAM:
CT HEAD WITHOUT CONTRAST
TECHNIQUE: Contiguous axial images were obtained from the base of the skull
through the vertex without intravenous contrast.

[Series 3: head 5.0 h30s · axial · 0.52mm/px · z∈[-127,+23]mm · 8 of 36 slices shown, 10 images]
[im 3/36  brain]
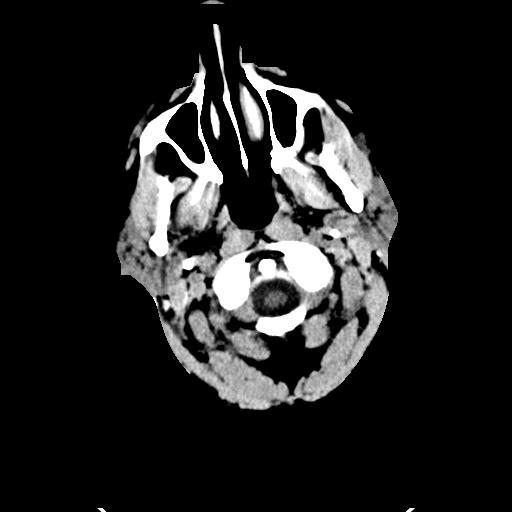
[im 3/36  bone]
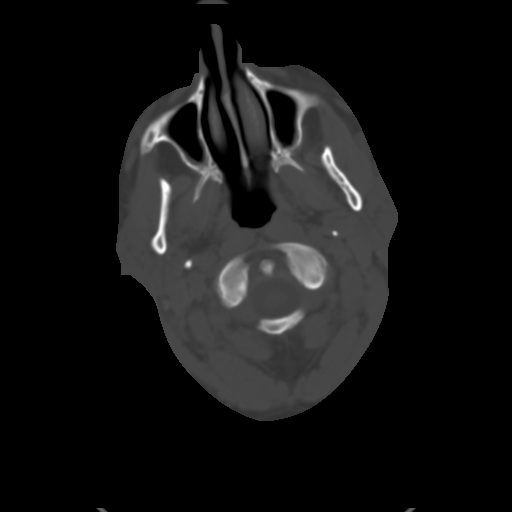
[im 8/36  brain]
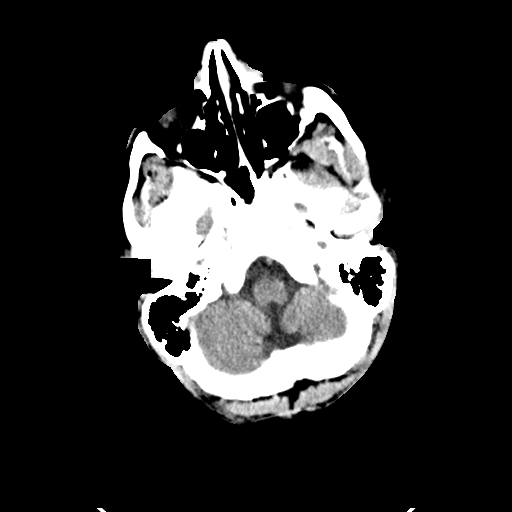
[im 11/36  brain]
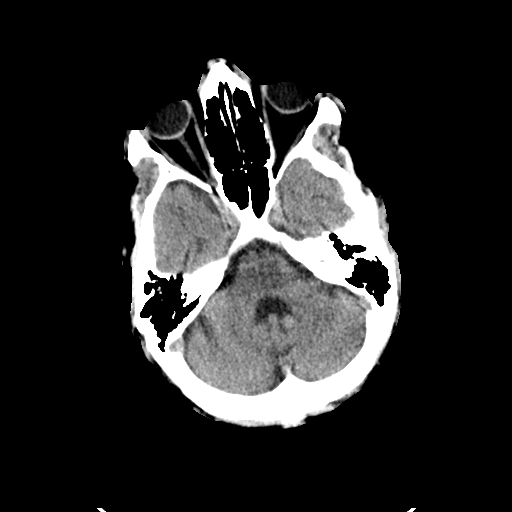
[im 16/36  brain]
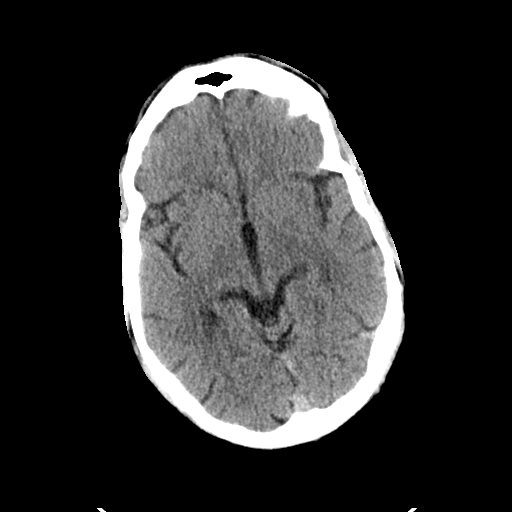
[im 20/36  brain]
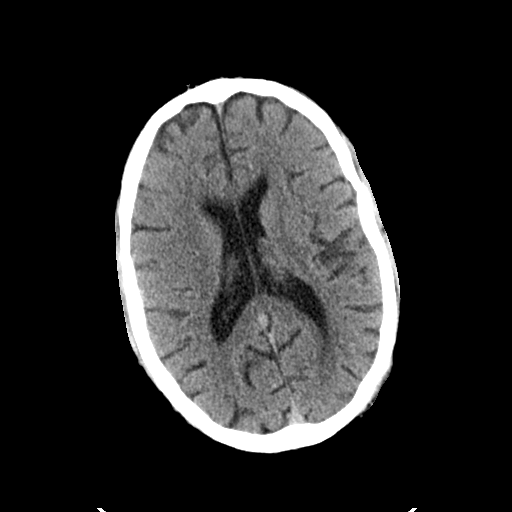
[im 20/36  bone]
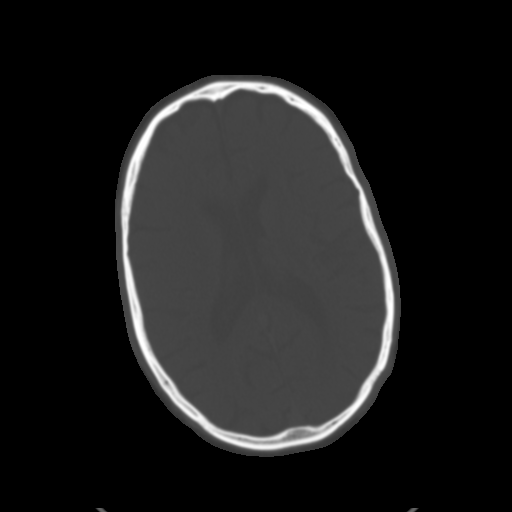
[im 25/36  brain]
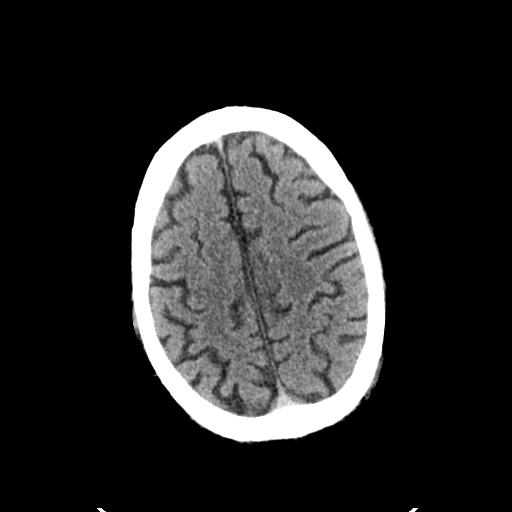
[im 28/36  brain]
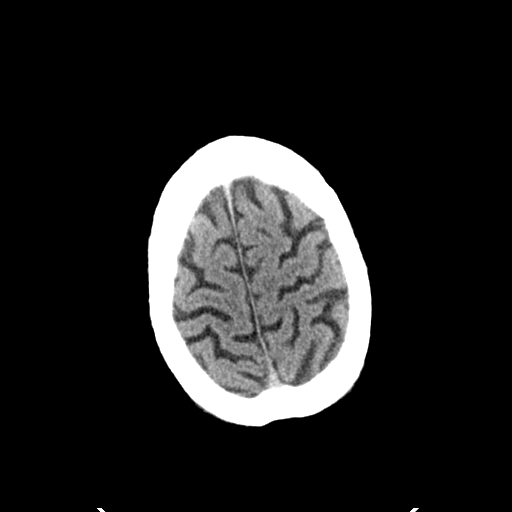
[im 33/36  brain]
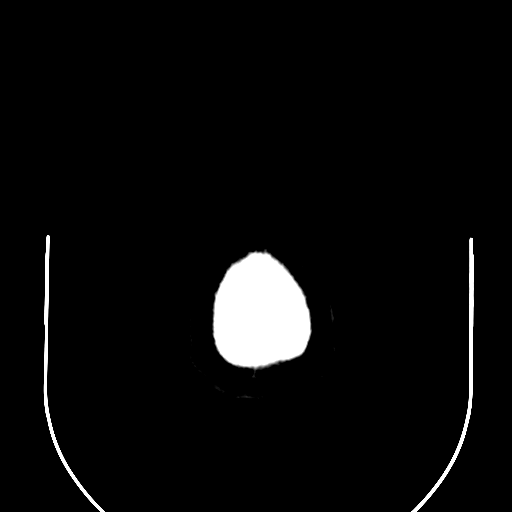

[Series 5: head 3.0 mpr cor · coronal · 0.35mm/px · 3 of 78 slices shown]
[im 26/78  brain]
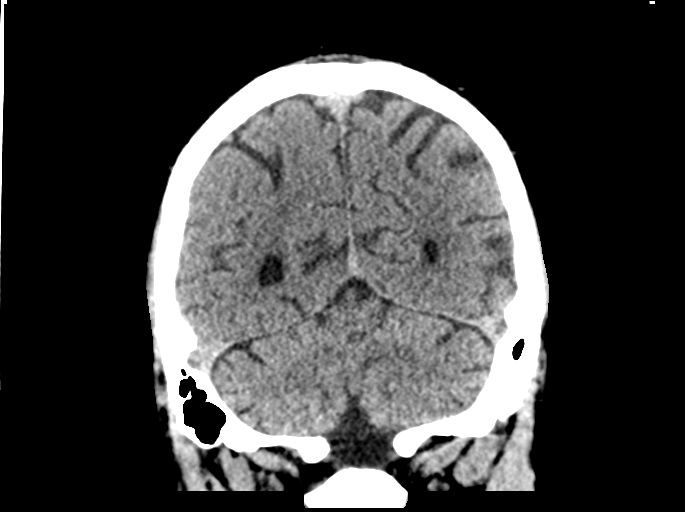
[im 35/78  brain]
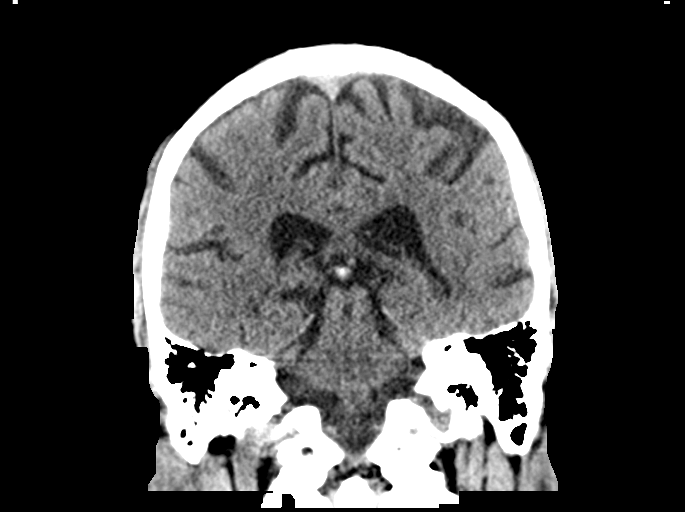
[im 43/78  brain]
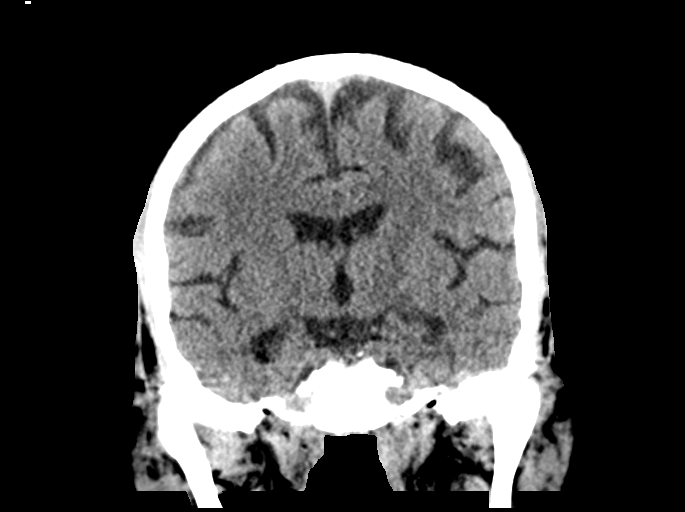

[Series 6: head 3.0 mpr sag · sagittal · 0.35mm/px · 3 of 67 slices shown]
[im 23/67  brain]
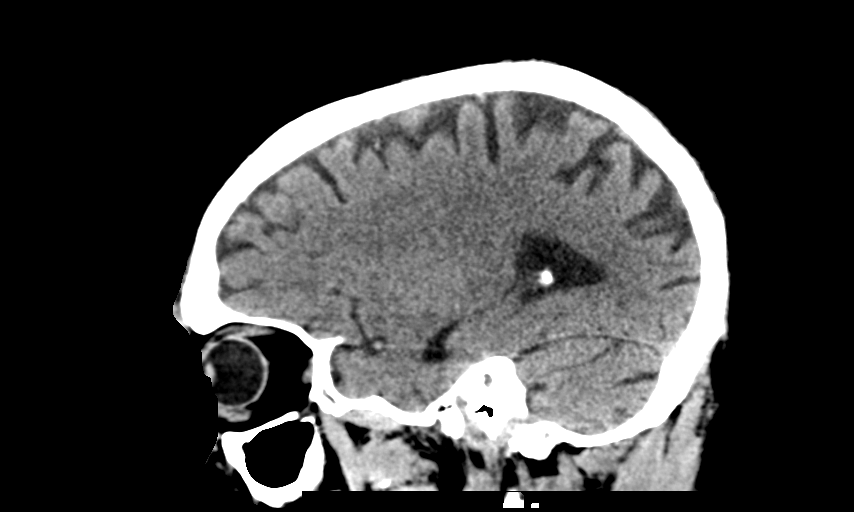
[im 34/67  brain]
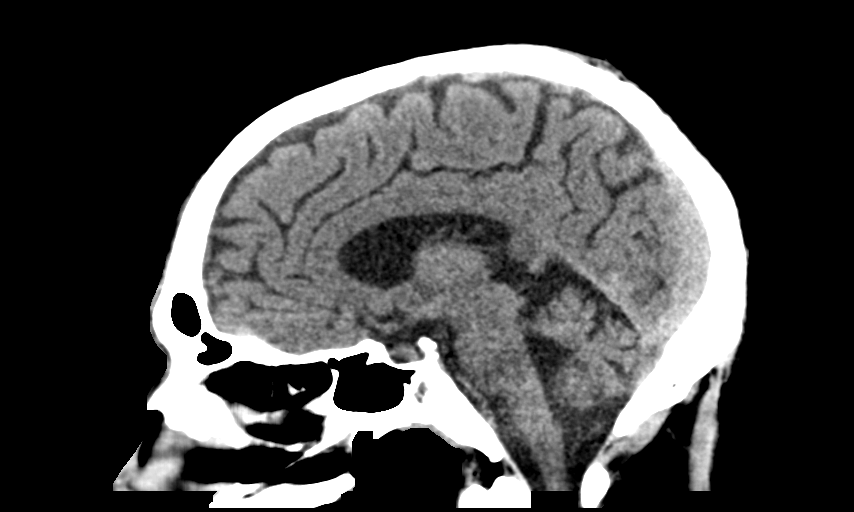
[im 45/67  brain]
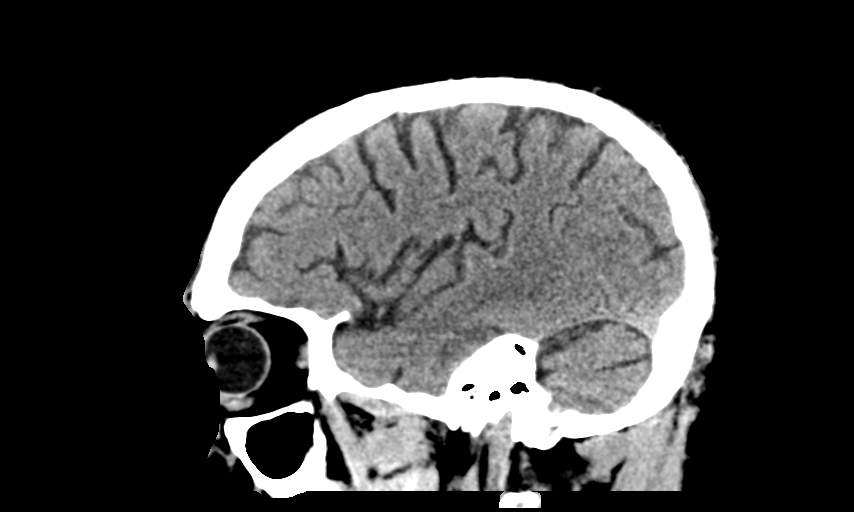

[14 of 47 positions shown; findings below may reference images not displayed]

FINDINGS: Brain: No acute intracranial abnormality. Specifically, no
hemorrhage, hydrocephalus, mass lesion, acute infarction, or
significant intracranial injury.

Vascular: No hyperdense vessel or unexpected calcification.

Skull: No acute calvarial abnormality.

Sinuses/Orbits: Visualized paranasal sinuses and mastoids clear.
Orbital soft tissues unremarkable.

Other: None
IMPRESSION: No acute intracranial abnormality.

## 2019-03-27 IMAGING — US RENAL/URINARY TRACT ULTRASOUND
1 series · 14 of 25 positions shown · non-contrast
Comparison: None.

CLINICAL DATA: Acute kidney injury

EXAM:
RENAL / URINARY TRACT ULTRASOUND COMPLETE

[Series 1: renal/urinary tract ultrasound · 14 of 27 slices shown]
[im 1/27]
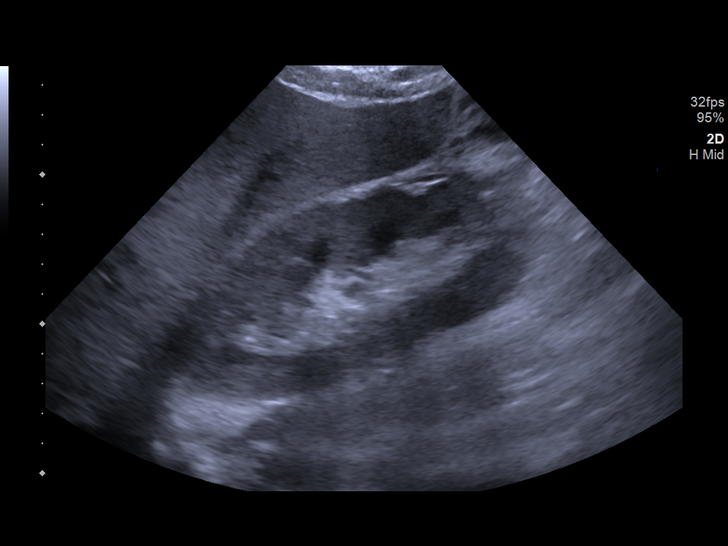
[im 3/27]
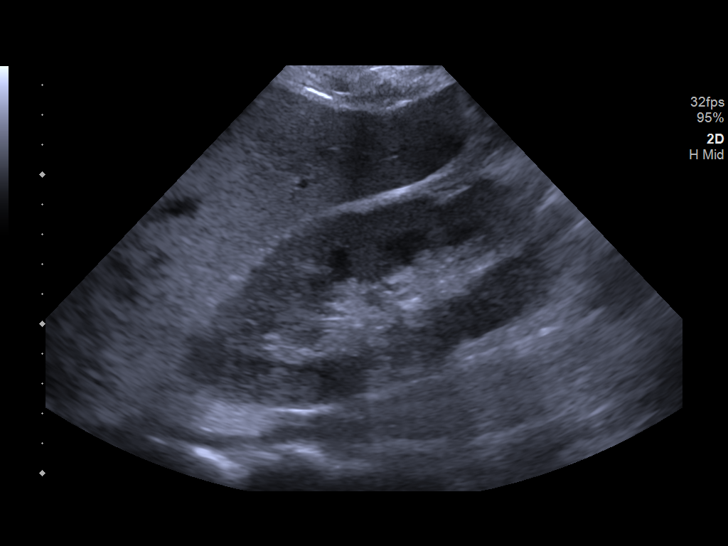
[im 5/27]
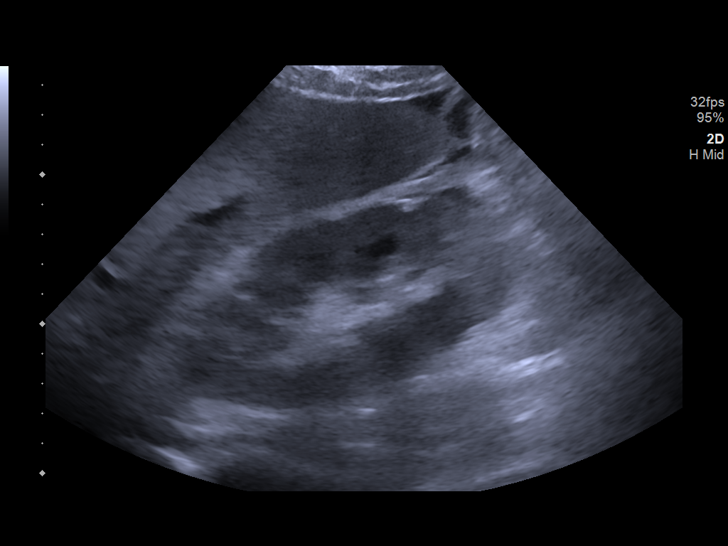
[im 7/27]
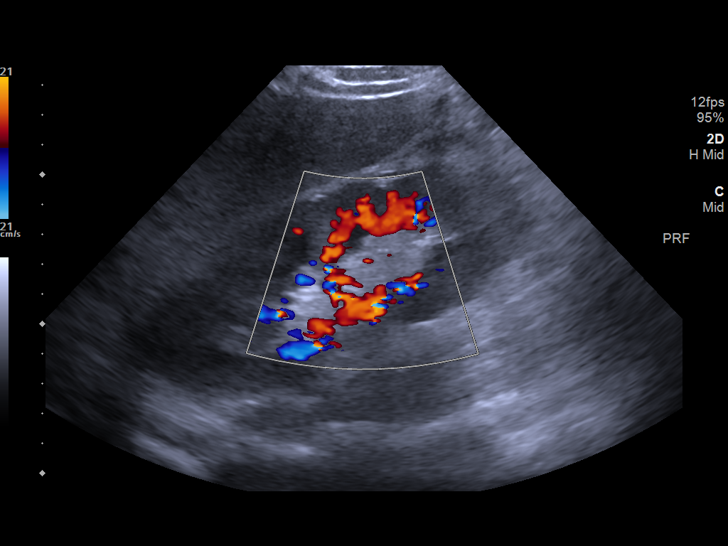
[im 9/27]
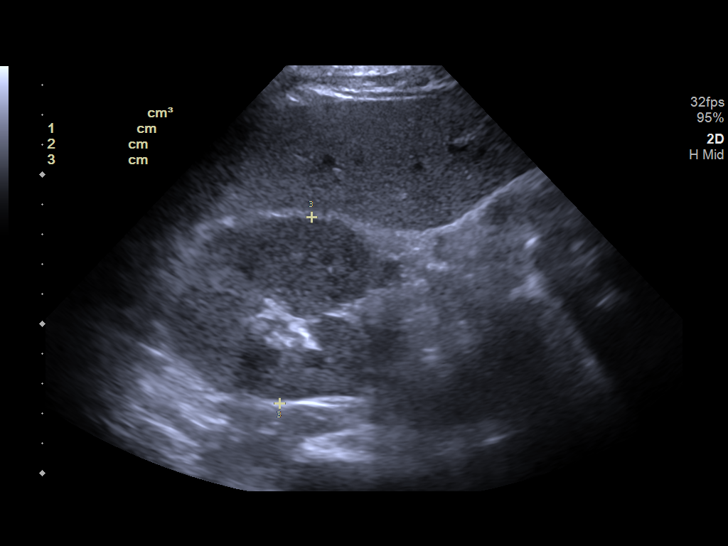
[im 10/27]
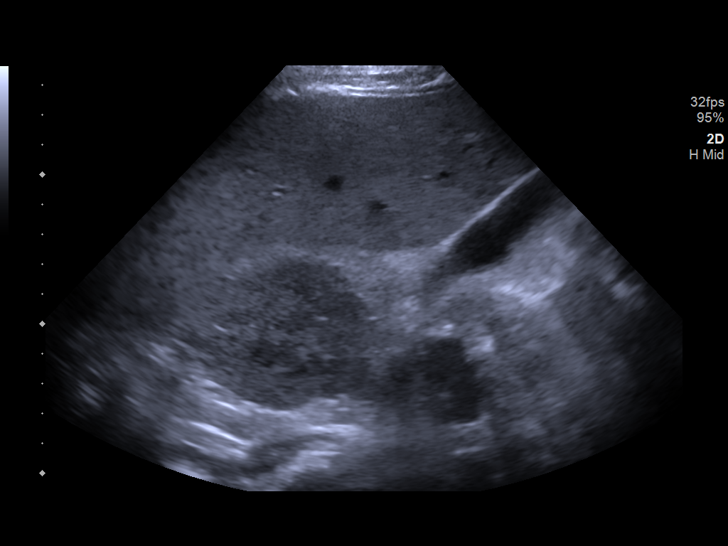
[im 12/27]
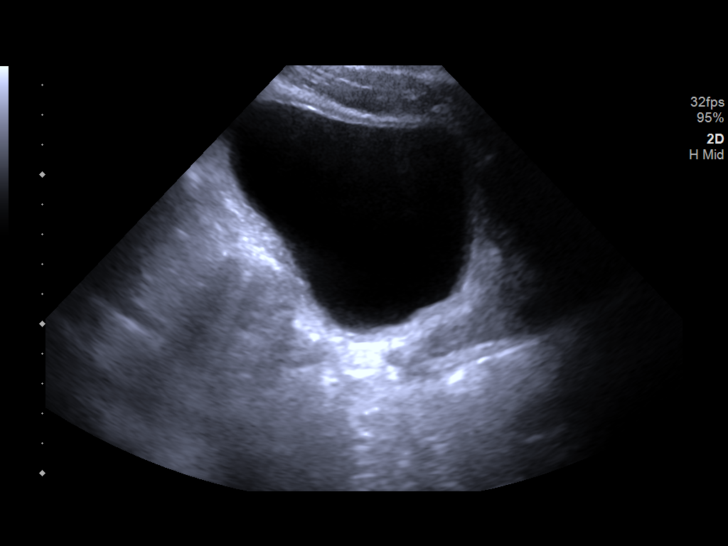
[im 15/27]
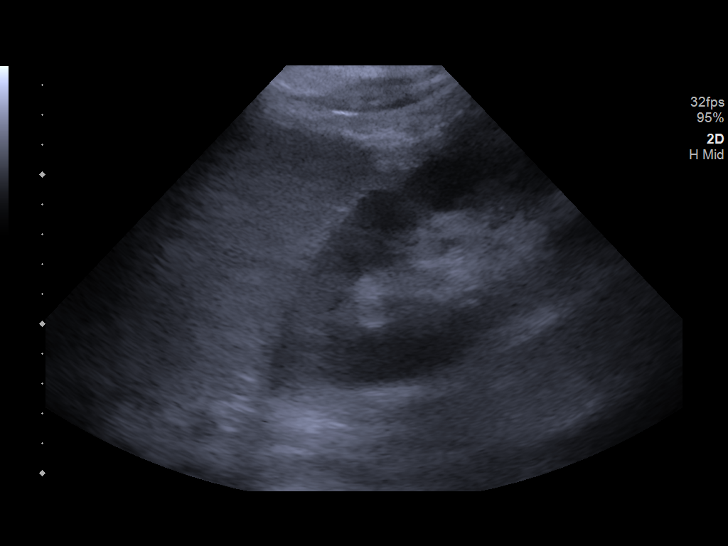
[im 17/27]
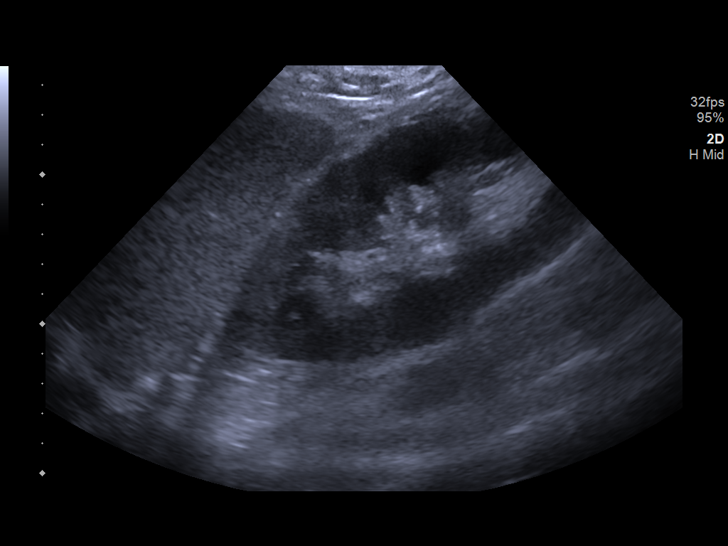
[im 18/27]
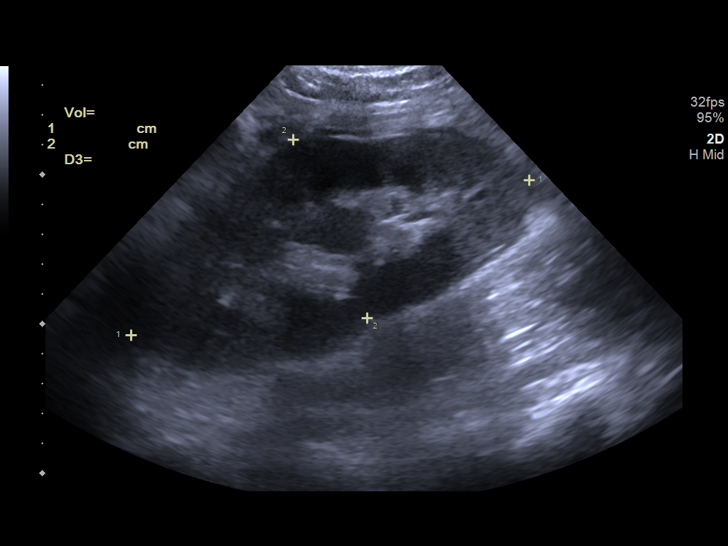
[im 20/27]
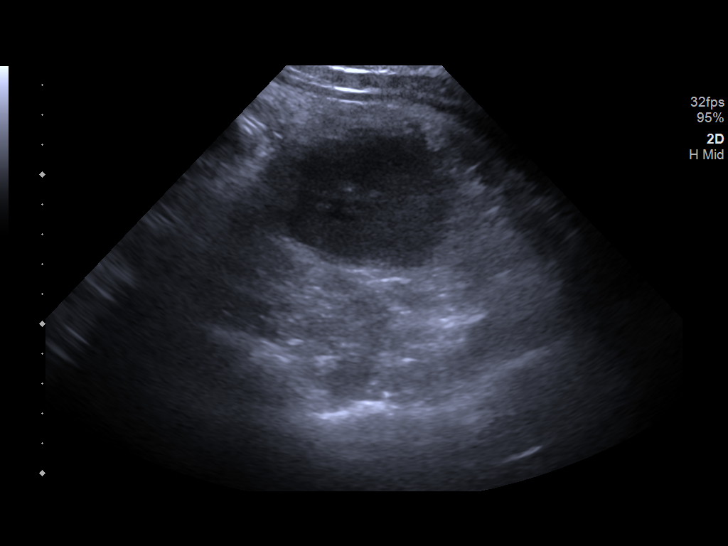
[im 22/27]
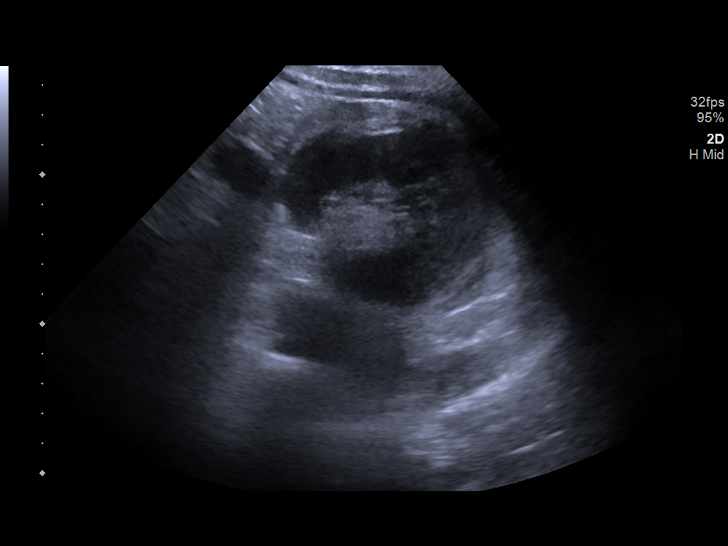
[im 24/27]
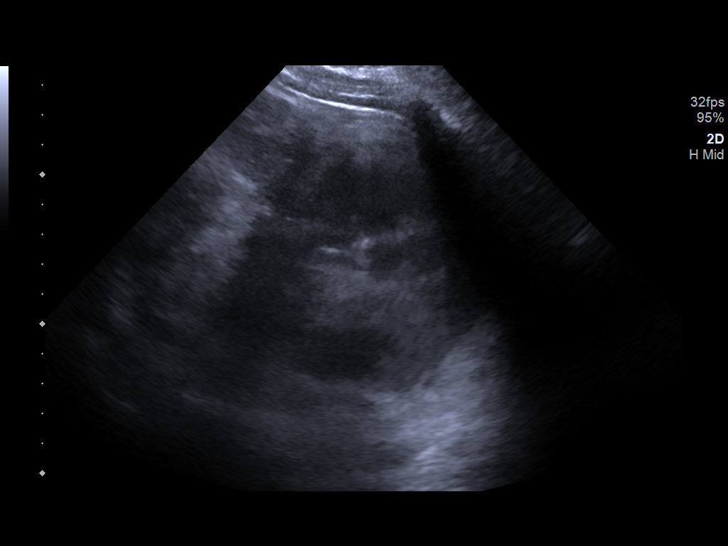
[im 27/27]
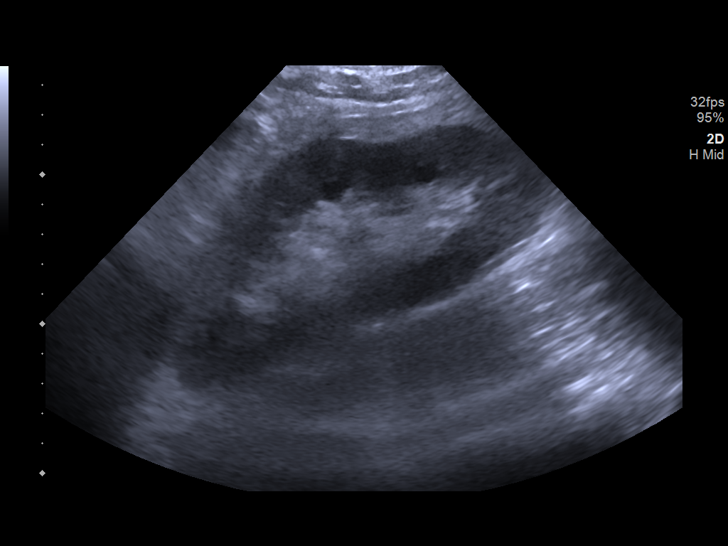

[14 of 25 positions shown; findings below may reference images not displayed]

FINDINGS: Right Kidney:

Renal measurements: 13.8 x 5.8 x 6.3 cm = volume: 268.4 mL .
Echogenicity within normal limits. No mass or hydronephrosis
visualized. Trace right perinephric fluid.

Left Kidney:

Renal measurements: 14.3 x 6.4 x 7.6 cm = volume: 371.8 mL.
Echogenicity within normal limits. No mass or hydronephrosis
visualized.

Bladder:

Appears normal for degree of bladder distention.
IMPRESSION: Negative renal ultrasound

## 2019-03-29 IMAGING — DX PORTABLE CHEST - 1 VIEW
1 series · 1 of 1 positions shown · non-contrast
Comparison: Yesterday

CLINICAL DATA: Endotracheal tube

EXAM:
PORTABLE CHEST 1 VIEW

[chest ap]
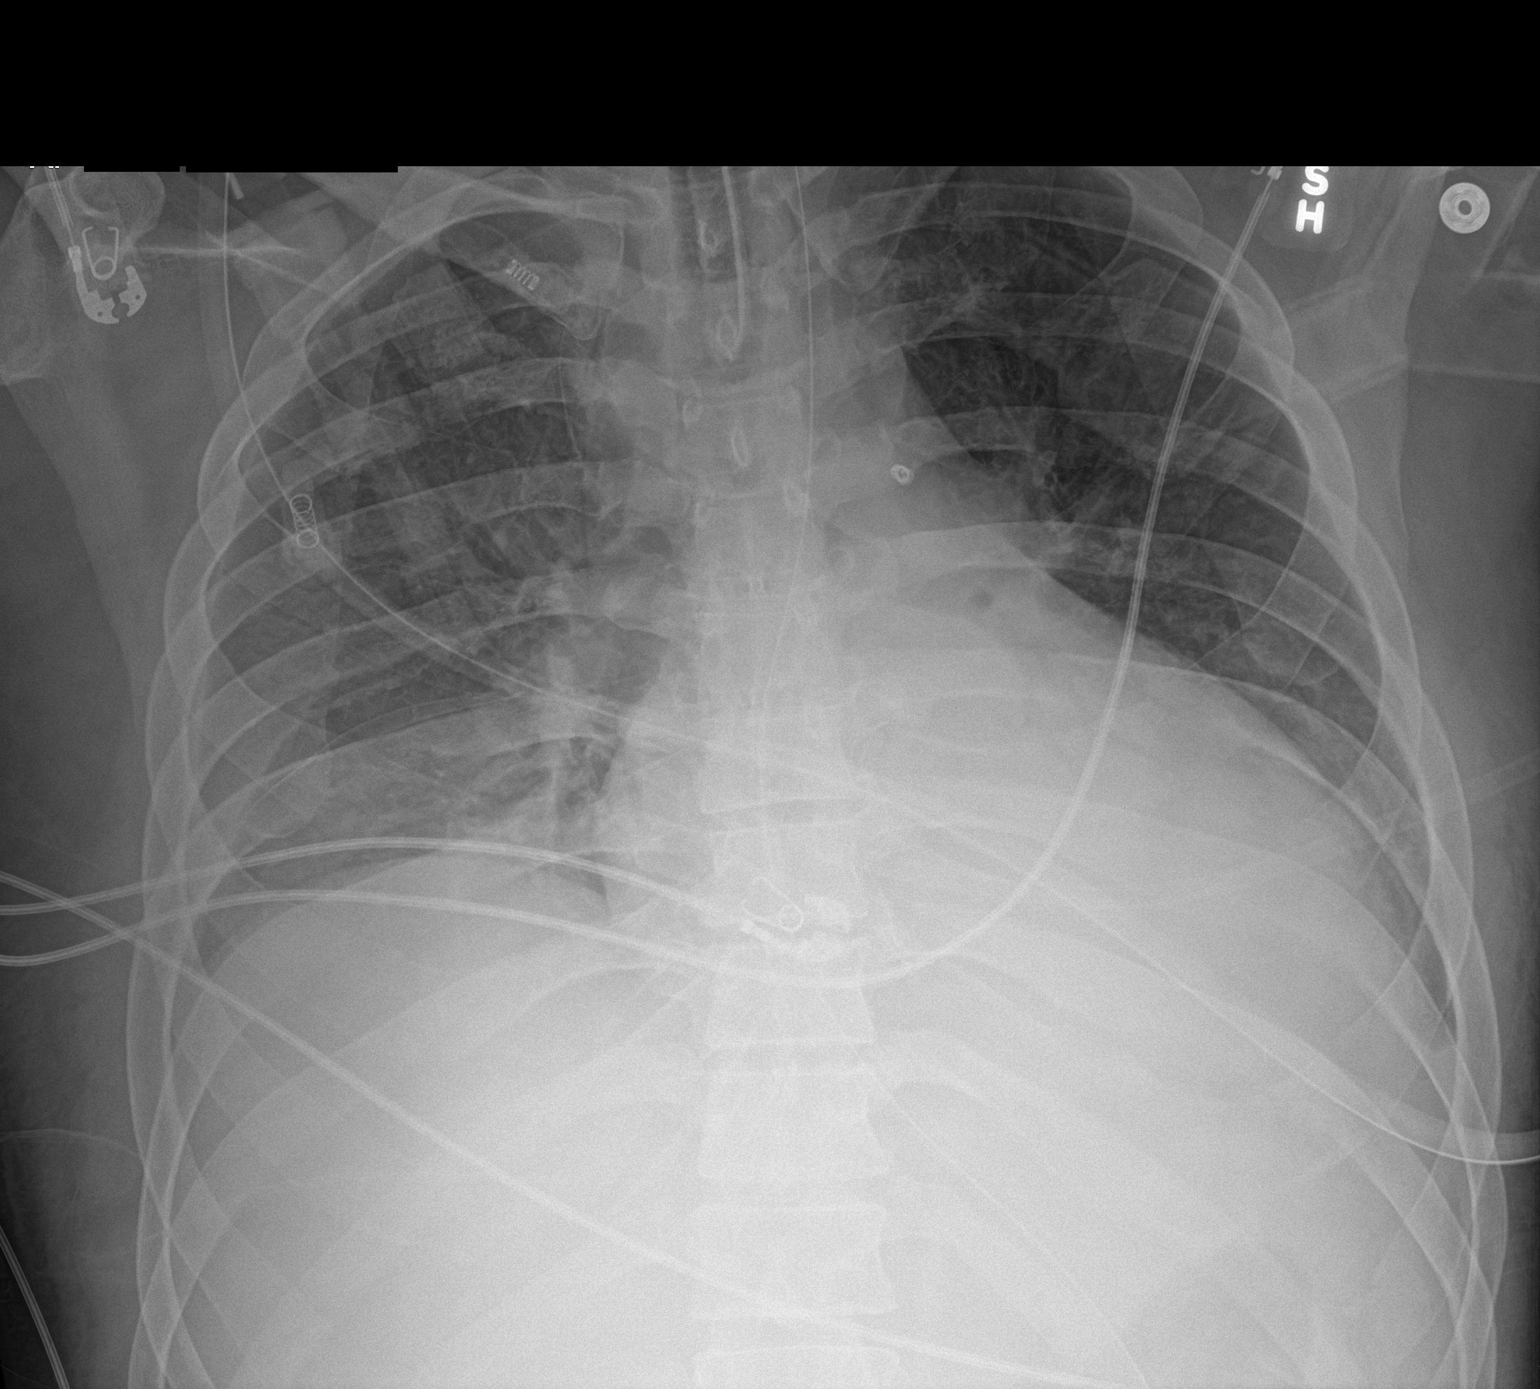

[1 of 1 positions shown; findings below may reference images not displayed]

FINDINGS: Endotracheal tube tip is just below the clavicular heads, improved.
Orogastric tube at least reaches the stomach. Cardiopericardial
enlargement and lower lobe opacities with left-sided air
bronchogram. Layering pleural fluid on the right at least. No
pneumothorax is seen.
IMPRESSION: 1. Improved endotracheal tube positioning.
2. Atelectasis or pneumonia at the bases with layering pleural
fluid.

## 2019-03-30 ENCOUNTER — Telehealth (HOSPITAL_COMMUNITY): Payer: Self-pay

## 2019-03-30 NOTE — Telephone Encounter (Signed)
LMTCB COVID prescreening for echo. Left echo appointment details per DPR. 

## 2019-03-31 ENCOUNTER — Ambulatory Visit (HOSPITAL_COMMUNITY): Payer: BLUE CROSS/BLUE SHIELD | Attending: Cardiology

## 2019-03-31 ENCOUNTER — Other Ambulatory Visit: Payer: Self-pay

## 2019-03-31 DIAGNOSIS — I519 Heart disease, unspecified: Secondary | ICD-10-CM

## 2019-04-01 IMAGING — DX CHEST  1 VIEW
1 series · 1 of 1 positions shown · non-contrast
Comparison: 12/23/2018

CLINICAL DATA: Fever.  Extubated.

EXAM:
CHEST  1 VIEW

[chest ap]
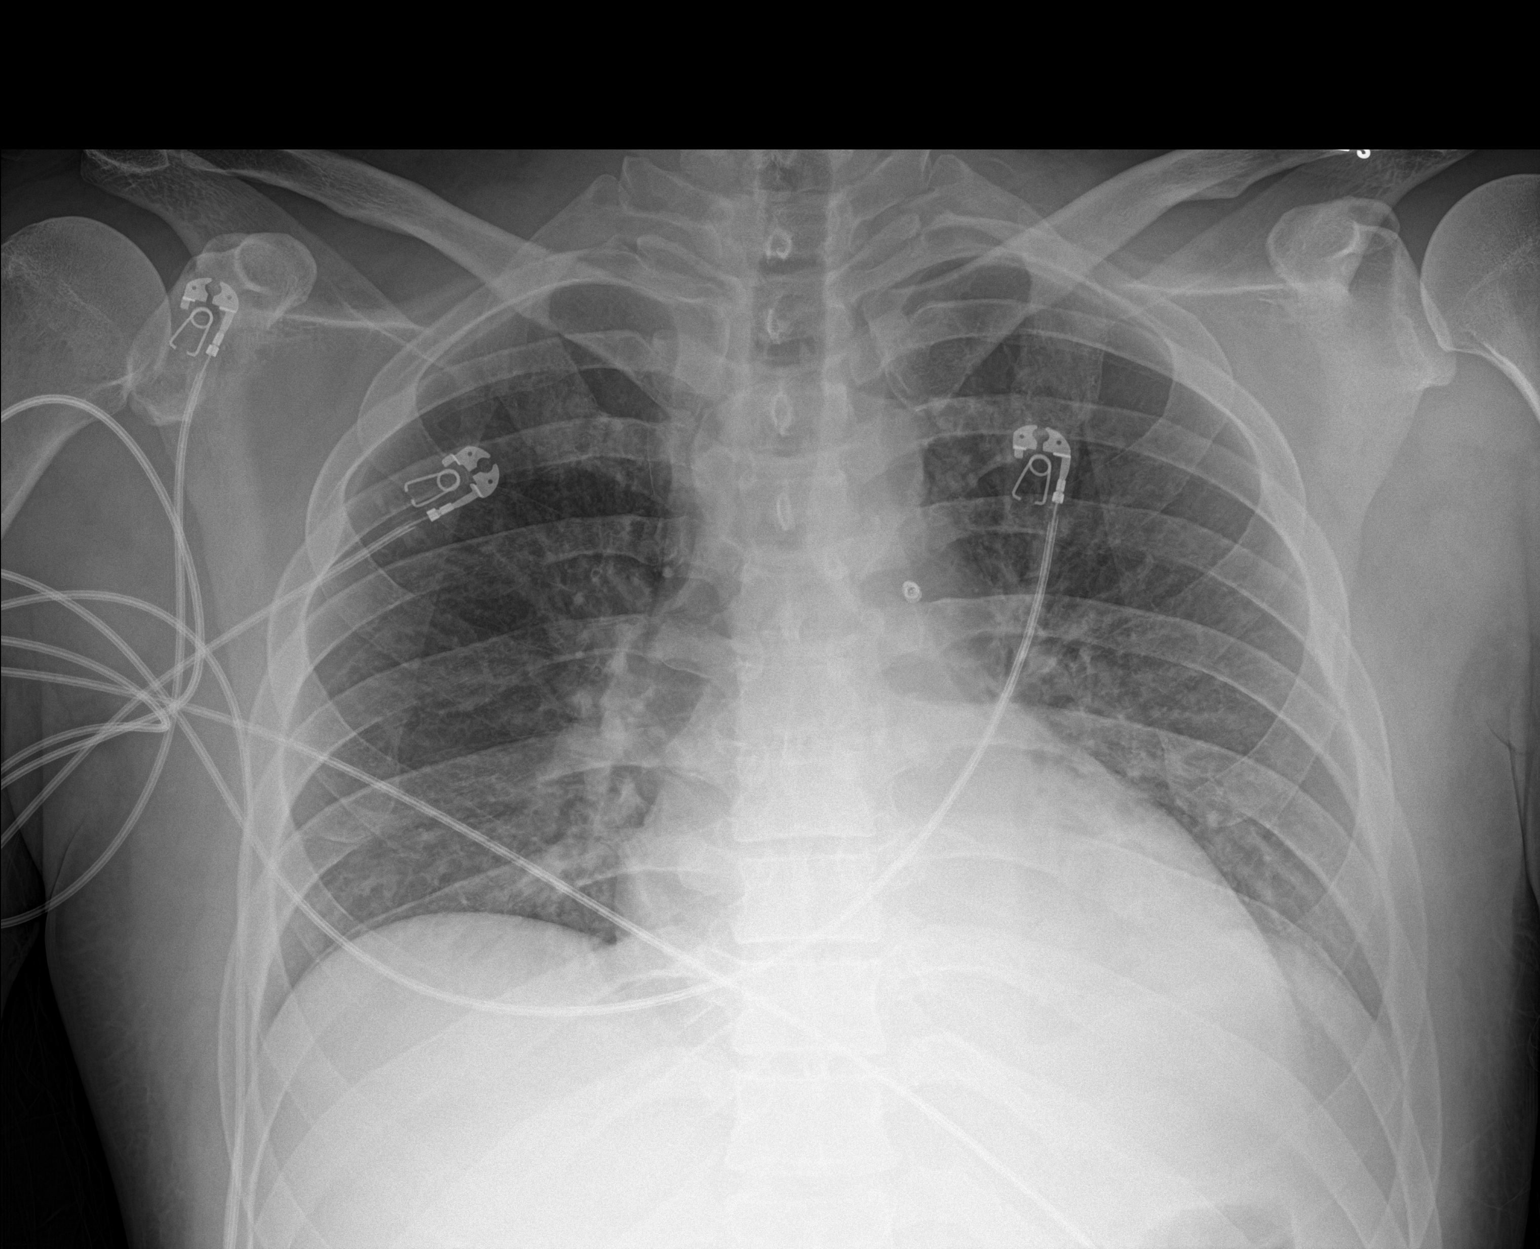

[1 of 1 positions shown; findings below may reference images not displayed]

FINDINGS: Endotracheal tube and nasogastric tube have been removed. Improved
but mild and persistent infiltrate/atelectasis in the lower lobes
left more than right. No worsening or new finding.
IMPRESSION: Endotracheal tube and nasogastric tube removed. Better aeration of
the lower lungs. Mild persistent atelectasis/infiltrate in the lower
lobes, left more than right.

## 2019-04-06 ENCOUNTER — Telehealth: Payer: Self-pay | Admitting: Cardiovascular Disease

## 2019-04-06 NOTE — Telephone Encounter (Signed)
Patient called and would like to know the results of his echo done on 03/31/19.

## 2019-04-06 NOTE — Telephone Encounter (Signed)
Result Notes for ECHOCARDIOGRAM COMPLETE  Notes recorded by Lorretta Harp, MD on 04/02/2019 at 10:37 AM EDT  Essentially normal study. Repeat when clinically indicated.   Spoke with the pt and informed him of his echo results per Dr. Gwenlyn Found.  Pt verbalized understanding.

## 2019-07-24 ENCOUNTER — Other Ambulatory Visit: Payer: Self-pay | Admitting: Cardiovascular Disease

## 2019-10-03 ENCOUNTER — Telehealth: Payer: Self-pay | Admitting: *Deleted

## 2019-10-03 DIAGNOSIS — R569 Unspecified convulsions: Secondary | ICD-10-CM

## 2019-10-03 NOTE — Telephone Encounter (Signed)
Received fax from [facility] for refill on levetiracetam. Patient last seen 09/2018, needs follow up. I called and LVM advising him of this refill request and that he need sot be seen yearly, left # to call and schedule FU with Dr Leta Baptist or NP.

## 2019-10-04 MED ORDER — LEVETIRACETAM 1000 MG PO TABS: 1000.0000 mg | ORAL_TABLET | Freq: Two times a day (BID) | ORAL | 1 refills | Status: DC

## 2019-10-04 NOTE — Telephone Encounter (Signed)
Attempted to reach patient again. Call immediately went to VM. Left message requesting he call and schedule follow up.

## 2019-10-04 NOTE — Telephone Encounter (Signed)
[facility], spoke with Ronalee Belts who stated patient has been refilling regularly from Dec 2019 Rx.  He stated the new Rx just came in. I requested he please tell patient to call and schedule follow up, and it may be a virtual visit. Ronalee Belts verbalized understanding, appreciation.

## 2020-12-30 ENCOUNTER — Telehealth: Payer: Self-pay | Admitting: Diagnostic Neuroimaging

## 2020-12-30 ENCOUNTER — Encounter: Payer: Self-pay | Admitting: *Deleted

## 2020-12-30 NOTE — Telephone Encounter (Signed)
Pt needing to be seen as soon as possible OF:[id number] so a fit to work document can be competed.  Pt is asking for a call from RN.

## 2020-12-30 NOTE — Telephone Encounter (Signed)
Called patient who stated [place] was in [place] when [place] had a seizure. He stated they put him on Chmordiazpxide (patient spelling it from med bottle) , zofran, potassium chloride from hospital in [place].  He stated he' s taking levetiracetam and has [facility], PCP who's been refilling it. He has a new job, was signed off to work by PCP but needs neurology to sign off. I advised him of office fee of $50 for forms. rescheduled his FU, advised he bring insurance cords, arrive 30 minutes early to check in. Patient verbalized understanding, appreciation.

## 2021-01-01 ENCOUNTER — Ambulatory Visit: Payer: Self-pay | Admitting: Diagnostic Neuroimaging

## 2021-01-06 ENCOUNTER — Ambulatory Visit: Payer: BLUE CROSS/BLUE SHIELD | Admitting: Diagnostic Neuroimaging

## 2021-03-19 ENCOUNTER — Telehealth: Payer: Self-pay | Admitting: *Deleted

## 2021-03-19 ENCOUNTER — Ambulatory Visit: Payer: Self-pay | Admitting: Diagnostic Neuroimaging

## 2021-03-19 NOTE — Telephone Encounter (Signed)
Patient was no show for follow up appointment today.  

## 2021-05-06 ENCOUNTER — Encounter: Payer: Self-pay | Admitting: Diagnostic Neuroimaging

## 2021-05-06 ENCOUNTER — Ambulatory Visit: Payer: BC Managed Care – PPO | Admitting: Diagnostic Neuroimaging

## 2021-05-06 VITALS — BP 122/85 | HR 108 | Ht 71.0 in | Wt 191.0 lb

## 2021-05-06 DIAGNOSIS — R569 Unspecified convulsions: Secondary | ICD-10-CM

## 2021-05-06 DIAGNOSIS — F10231 Alcohol dependence with withdrawal delirium: Secondary | ICD-10-CM | POA: Diagnosis not present

## 2021-05-06 DIAGNOSIS — F10931 Alcohol use, unspecified with withdrawal delirium: Secondary | ICD-10-CM

## 2021-05-06 NOTE — Progress Notes (Addendum)
[facility]  PATIENT: Darren Diaz DOB: 07-06-1986  REFERRING CLINICIAN: No ref. provider found  HISTORY FROM: patient  REASON FOR VISIT: follow up   HISTORICAL  CHIEF COMPLAINT:  Chief Complaint  Patient presents with   Follow-up    RM 6 alone Pt is well, had a seizure back in January. None since that he is aware of.     HISTORY OF PRESENT ILLNESS:   UPDATE (05/06/21, VRP): Since last visit, lost to follow up. Stopped LEV in Jan 2021. Was drinking 4-5 drinks daily in 2021. Then stopped in Dec 2021 while visit family in [place]. Had alcohol withdrawal seizure in 10/16/20. Tx'd with phenobarbital for a few days. Since then, no seizures. Now drinking 1-2 drinks per night. Found old rx for LEV and started taking them 1-2 weeks ago.   PRIOR HPI (09/28/18): 35 year old male here for evaluation of seizure.  Patient has history of alcohol abuse.  Patient presented to the emergency room on 09/08/2018 due to witnessed seizure at home.  Patient lives alone.  His family was coming in town to visit for Thanksgiving.  He had started to cut down on his daily alcohol use in anticipation of his family visiting.  On 09/07/2018 in the evening patient was in an argument with his family, acting erratically, when all of a sudden his parents heard him fall in another room.  When they came to his side he was having generalized convulsions.  Patient was taken to the emergency room by EMS.  Alcohol level was 125.  Patient had a second seizure in the emergency room, was intubated and started on antiseizure medication.  He was diagnosed with a traumatic subarachnoid hemorrhage as well, that likely occurred as a result of falling and hitting his head after the onset of the seizure.  Patient was also treated for alcohol withdrawal.  Patient had several more breakthrough seizures in the hospital.  Antiseizure medication was increased.  Ultimately patient was stabilized and discharged home on  09/21/2018.  Since that time patient is stable.  No recurrent seizures.  He has noticed increased tingling in his feet and legs.  He was referred to set up care with primary care physician.  He was discharged on gabapentin and quetiapine related to his alcohol dependence.  He does not have follow-up with psychiatry or psychology.  No family history of seizure.  No personal prior history of seizure.   REVIEW OF SYSTEMS: Full 14 system review of systems performed and negative with exception of: As per HPI.   ALLERGIES: No Known Allergies  HOME MEDICATIONS: Outpatient Medications Prior to Visit  Medication Sig Dispense Refill   carvedilol (COREG) 12.5 MG tablet TAKE 1 TABLET BY MOUTH TWICE DAILY WITH A MEAL 90 tablet 1   gabapentin (NEURONTIN) 600 MG tablet Take 0.5 tablets (300 mg total) by mouth 2 (two) times daily. 60 tablet 0   levETIRAcetam (KEPPRA) 1000 MG tablet Take 1 tablet (1,000 mg total) by mouth 2 (two) times daily. 60 tablet 1   oxyCODONE (OXY IR/ROXICODONE) 5 MG immediate release tablet Take 1 tablet (5 mg total) by mouth every 6 (six) hours as needed for severe pain. 20 tablet 0   pyridOXINE (VITAMIN B-6) 100 MG tablet Take 1 tablet (100 mg total) by mouth daily. 30 tablet 0   sacubitril-valsartan (ENTRESTO) 49-51 MG Take 1 tablet by mouth 2 (two) times daily. 180 tablet 3   isosorbide mononitrate (IMDUR) 30 MG 24 hr tablet Take 1 tablet (30  mg total) by mouth daily for 30 days. 90 tablet 3   ondansetron (ZOFRAN) 4 MG tablet Take 4 mg by mouth every 8 (eight) hours as needed for nausea or vomiting.     potassium chloride (KLOR-CON) 20 MEQ packet Take by mouth 2 (two) times daily.     No facility-administered medications prior to visit.    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   SHOULDER ARTHROSCOPY Right 12/30/2018   Procedure: ARTHROSCOPIC IRRIGATION AND DEBRIDEMENT OF SHOULDER JOINT;  Surgeon: Cammy Copa, MD;  Location: [facility] OR;  Service: Orthopedics;  Laterality: Right;    FAMILY HISTORY: Family History  Problem Relation Age of Onset   Fibromyalgia Mother     SOCIAL HISTORY: Social History   Socioeconomic History   Marital status: Single    Spouse name: Not on file   Number of children: 0   Years of education: Not on file   Highest education level: Master's degree (e.g., MA, MS, MEng, MEd, MSW, MBA)  Occupational History   Not on file  Tobacco Use   Smoking status: Every Day    Types: Cigarettes   Smokeless tobacco: Never   Tobacco comments:    02/14/19 1/3 PPD  Vaping Use   Vaping Use: Never used  Substance and Sexual Activity   Alcohol use: Yes    Alcohol/week: 6.0 standard drinks    Types: 3 Cans of beer, 3 Standard drinks or equivalent per week    Comment: 09/28/18 1-2 daily   Drug use: No   Sexual activity: Yes    Birth control/protection: None  Other Topics Concern   Not on file  Social History Narrative   Lives alone   Caffeine- 0-1 cups coffee daily   Social Determinants of Health   Financial Resource Strain: Not on file  Food Insecurity: Not on file  Transportation Needs: Not on file  Physical Activity: Not on file  Stress: Not on file  Social Connections: Not on file  Intimate Partner Violence: Not on file     PHYSICAL EXAM  GENERAL EXAM/CONSTITUTIONAL: Vitals:  Vitals:   05/06/21 0941  BP: 122/85  Pulse: (!) 108  Weight: 191 lb (86.6 kg)  Height: 5\' 11"  (1.803 m)   Body mass index is 26.64 kg/m. Wt  Readings from Last 3 Encounters:  05/06/21 191 lb (86.6 kg)  01/24/19 190 lb (86.2 kg)  01/02/19 193 lb 2 oz (87.6 kg)   Patient is in no distress; well developed, nourished and groomed; neck is supple; slightly diaphoretic  CARDIOVASCULAR: Examination of carotid arteries is normal; no carotid bruits Regular rate and rhythm, no murmurs Examination of peripheral vascular system by observation and palpation is normal  EYES: Ophthalmoscopic exam of optic discs and posterior segments is normal; no papilledema or hemorrhages No results found.  MUSCULOSKELETAL: Gait, strength, tone, movements noted in Neurologic exam below  NEUROLOGIC: MENTAL STATUS:  No flowsheet data found. awake, alert,  oriented to person, place and time recent and remote memory intact normal attention and concentration language fluent, comprehension intact, naming intact fund of knowledge appropriate  CRANIAL NERVE:  2nd - no papilledema on fundoscopic exam 2nd, 3rd, 4th, 6th - pupils equal and reactive to light, visual fields full to confrontation, extraocular muscles intact, saccadic breakdown of smooth pursuit 5th - facial sensation symmetric 7th - facial strength symmetric 8th - hearing intact 9th - palate elevates symmetrically, uvula midline 11th - shoulder shrug symmetric 12th - tongue protrusion midline  MOTOR:  normal bulk and tone, full strength in the BUE, BLE TREMOR WITH LUE  SENSORY:  normal and symmetric to light touch, temperature, vibration  COORDINATION:  finger-nose-finger, fine finger movements normal  REFLEXES:  deep tendon reflexes present and symmetric  GAIT/STATION:  narrow based gait     DIAGNOSTIC DATA (LABS, IMAGING, TESTING) - I reviewed patient records, labs, notes, testing and imaging myself where available.  Lab Results  Component Value Date   WBC 6.0 01/01/2019   HGB 9.8 (L) 01/01/2019   HCT 28.6 (L) 01/01/2019   MCV 100.0 01/01/2019   PLT 588 (H)  01/01/2019      Component Value Date/Time   NA 134 (L) 12/31/2018 0439   K 3.8 12/31/2018 0439   CL 99 12/31/2018 0439   CO2 22 12/31/2018 0439   GLUCOSE 107 (H) 12/31/2018 0439   BUN 12 12/31/2018 0439   CREATININE 1.10 12/31/2018 0439   CALCIUM 9.4 12/31/2018 0439   PROT 6.3 (L) 12/28/2018 0353   ALBUMIN 2.2 (L) 12/28/2018 0353   AST 34 12/28/2018 0353   ALT 37 12/28/2018 0353   ALKPHOS 44 12/28/2018 0353   BILITOT 0.9 12/28/2018 0353   GFRNONAA >60 12/31/2018 0439   GFRAA >60 12/31/2018 0439   Lab Results  Component Value Date   TRIG 68 09/08/2018   Lab Results  Component Value Date   HGBA1C 4.1 (L) 09/14/2018   Lab Results  Component Value Date   VITAMINB12 807 12/25/2018   Lab Results  Component Value Date   TSH 4.715 (H) 12/25/2018    09/08/18 MRI brain [I reviewed images myself. Right temporal SAH and contusion. -VRP]  1. Acute brain finding of abnormal subarachnoid fluid or blood in the right temporal lobe. This might be trace subarachnoid hemorrhage (posttraumatic) with differential diagnosis including proteinaceous CSF such as due to meningitis. No ventricular debris. Follow-up noncontrast Head CT for correlation would be valuable. 2. Negative for acute infarct or cerebral edema. 3. Generalized cerebral volume loss for age, which can be seen in the setting of chronic systemic diseases such as HIV.  4. Broad-based right posterior scalp hematoma. ADDENDUM:  - I have now become aware that the patient already underwent noncontrast head CT earlier today (report but no images currently available in [facility], probably due to an unscheduled IT downtime). - The report describes a small right temporal lobe hemorrhage/hemorrhagic contusion with possible SAH. CONCLUSION:  - Sequelae of trauma with small volume right temporal lobe hemorrhage strongly suspected to explain the abnormal subarachnoid FLAIR signal in #1.  09/18/18 MRI brain [I reviewed images myself and  agree with interpretation. -VRP]  - Brain parenchyma appears normal today. No evidence of residual intracranial hemorrhage. No sign that there was an intraparenchymal component.   - Left mastoid effusion, not seen previously. - Resolving right parietal scalp hematoma and edema.   09/08/18 EEG - Sedated EEG  - Clinical Interpretation: This EEG is consistent with the  patient's known sedated state.  There was no seizure or seizure predisposition recorded on this study. Please note that a normal EEG does not preclude the possibility of epilepsy.    ASSESSMENT AND PLAN  35 y.o. year old male here with history of alcohol abuse, with multiple seizures in the setting of alcohol withdrawal.  Also found to have traumatic subarachnoid hemorrhage, related to falling as a result of his initial seizure.     Dx:  1. 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     Suanne Marker, MD 05/06/2021, 10:18 AM Certified in Neurology, Neurophysiology and Neuroimaging  [facility] [address] [place], [place] [address] [phone]

## 2021-05-06 NOTE — Patient Instructions (Signed)
  ALCOHOL WITHDRAWAL SEIZURE (10/16/20) - currently on 1-2 drinks per night - continue counselor visits - do not recommend anti-seizure medications at this time

## 2022-03-29 ENCOUNTER — Encounter: Payer: Self-pay | Admitting: Cardiovascular Disease
